# Patient Record
Sex: Female | Born: 1980 | Race: White | Hispanic: No | Marital: Married | Smoking: Current every day smoker
Health system: Southern US, Community
[De-identification: ages and names within clinical notes are randomized; demographics above are authoritative.]

## PROBLEM LIST (undated history)

## (undated) ENCOUNTER — Inpatient Hospital Stay: Payer: Self-pay

## (undated) DIAGNOSIS — O24419 Gestational diabetes mellitus in pregnancy, unspecified control: Secondary | ICD-10-CM

## (undated) DIAGNOSIS — F200 Paranoid schizophrenia: Secondary | ICD-10-CM

## (undated) DIAGNOSIS — E119 Type 2 diabetes mellitus without complications: Secondary | ICD-10-CM

## (undated) DIAGNOSIS — F319 Bipolar disorder, unspecified: Secondary | ICD-10-CM

## (undated) DIAGNOSIS — F329 Major depressive disorder, single episode, unspecified: Secondary | ICD-10-CM

## (undated) DIAGNOSIS — G4700 Insomnia, unspecified: Secondary | ICD-10-CM

## (undated) DIAGNOSIS — F419 Anxiety disorder, unspecified: Secondary | ICD-10-CM

## (undated) DIAGNOSIS — F32A Depression, unspecified: Secondary | ICD-10-CM

## (undated) HISTORY — PX: HERNIA REPAIR: SHX51

## (undated) HISTORY — PX: APPENDECTOMY: SHX54

---

## 2007-08-10 ENCOUNTER — Emergency Department: Payer: Self-pay | Admitting: Emergency Medicine

## 2007-08-10 ENCOUNTER — Other Ambulatory Visit: Payer: Self-pay

## 2010-08-13 ENCOUNTER — Ambulatory Visit: Payer: Self-pay | Admitting: Unknown Physician Specialty

## 2010-09-13 ENCOUNTER — Ambulatory Visit: Payer: Self-pay | Admitting: Unknown Physician Specialty

## 2010-09-16 ENCOUNTER — Emergency Department: Payer: Self-pay | Admitting: Internal Medicine

## 2012-03-16 ENCOUNTER — Inpatient Hospital Stay: Payer: Self-pay | Admitting: Surgery

## 2012-03-16 LAB — COMPREHENSIVE METABOLIC PANEL
Albumin: 4.3 g/dL (ref 3.4–5.0)
Alkaline Phosphatase: 70 U/L (ref 50–136)
Anion Gap: 8 (ref 7–16)
Calcium, Total: 9.1 mg/dL (ref 8.5–10.1)
Chloride: 106 mmol/L (ref 98–107)
Co2: 24 mmol/L (ref 21–32)
Creatinine: 0.81 mg/dL (ref 0.60–1.30)
EGFR (African American): >60
EGFR (Non-African Amer.): >60
Osmolality: 275 (ref 275–301)
Potassium: 3.9 mmol/L (ref 3.5–5.1)
SGOT(AST): 14 U/L — ABNORMAL LOW (ref 15–37)
SGPT (ALT): 16 U/L (ref 12–78)
Sodium: 138 mmol/L (ref 136–145)

## 2012-03-16 LAB — CBC
HCT: 41.9 % (ref 35.0–47.0)
HGB: 14.7 g/dL (ref 12.0–16.0)
MCH: 34.6 pg — ABNORMAL HIGH (ref 26.0–34.0)
MCV: 99 fL (ref 80–100)
Platelet: 305 10*3/uL (ref 150–440)
RBC: 4.24 10*6/uL (ref 3.80–5.20)
RDW: 12.4 % (ref 11.5–14.5)

## 2012-03-16 LAB — URINALYSIS, COMPLETE
Bilirubin,UR: NEGATIVE
Leukocyte Esterase: NEGATIVE
Nitrite: NEGATIVE
Ph: 6 (ref 4.5–8.0)
Protein: 100
RBC,UR: 4 /HPF (ref 0–5)

## 2012-03-16 LAB — LIPASE, BLOOD: Lipase: 121 U/L (ref 73–393)

## 2012-03-17 LAB — BASIC METABOLIC PANEL
Anion Gap: 7 (ref 7–16)
Calcium, Total: 7.9 mg/dL — ABNORMAL LOW (ref 8.5–10.1)
Chloride: 107 mmol/L (ref 98–107)
Co2: 27 mmol/L (ref 21–32)
Creatinine: 0.66 mg/dL (ref 0.60–1.30)
EGFR (African American): >60
Glucose: 90 mg/dL (ref 65–99)
Potassium: 3.5 mmol/L (ref 3.5–5.1)
Sodium: 141 mmol/L (ref 136–145)

## 2012-03-17 LAB — CBC WITH DIFFERENTIAL/PLATELET
Eosinophil #: 0 10*3/uL (ref 0.0–0.7)
Eosinophil %: 0.3 %
HCT: 34.1 % — ABNORMAL LOW (ref 35.0–47.0)
Lymphocyte #: 1.9 10*3/uL (ref 1.0–3.6)
MCH: 34.5 pg — ABNORMAL HIGH (ref 26.0–34.0)
MCHC: 34.4 g/dL (ref 32.0–36.0)
MCV: 100 fL (ref 80–100)
Monocyte #: 0.4 x10 3/mm (ref 0.2–0.9)
Neutrophil %: 85 %
Platelet: 234 10*3/uL (ref 150–440)
RBC: 3.4 10*6/uL — ABNORMAL LOW (ref 3.80–5.20)
RDW: 12.2 % (ref 11.5–14.5)

## 2012-03-19 LAB — CBC WITH DIFFERENTIAL/PLATELET
Basophil %: 0.6 %
Eosinophil %: 1.7 %
Lymphocyte %: 12.3 %
Monocyte %: 5.3 %
Neutrophil %: 80.1 %
Platelet: 257 10*3/uL (ref 150–440)
RBC: 3.32 10*6/uL — ABNORMAL LOW (ref 3.80–5.20)
RDW: 12.1 % (ref 11.5–14.5)
WBC: 10.5 10*3/uL (ref 3.6–11.0)

## 2012-03-19 LAB — PATHOLOGY REPORT

## 2012-07-13 IMAGING — CT CT CERVICAL SPINE WITHOUT CONTRAST
1 series · 12 of 14 positions shown, 15 images · non-contrast
Comparison: none

REASON FOR EXAM: neck pain
COMMENTS:

[Series 6: axial · axial · 0.28mm/px · z∈[+282,+402]mm · 12 of 72 slices shown, 15 images]
[im 6/72  soft-tissue]
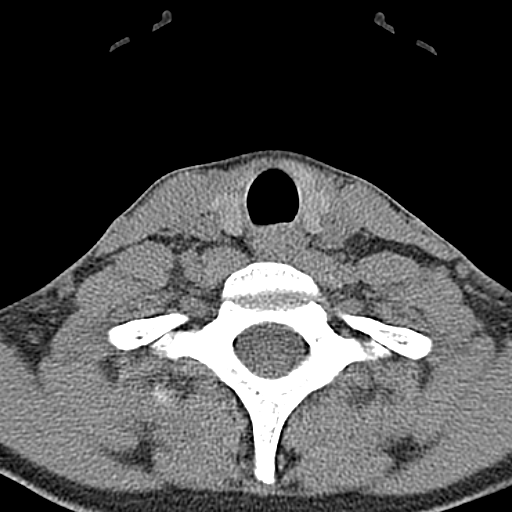
[im 6/72  bone]
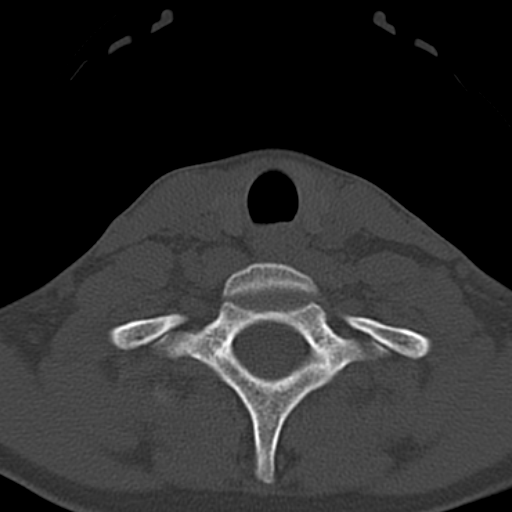
[im 11/72  bone]
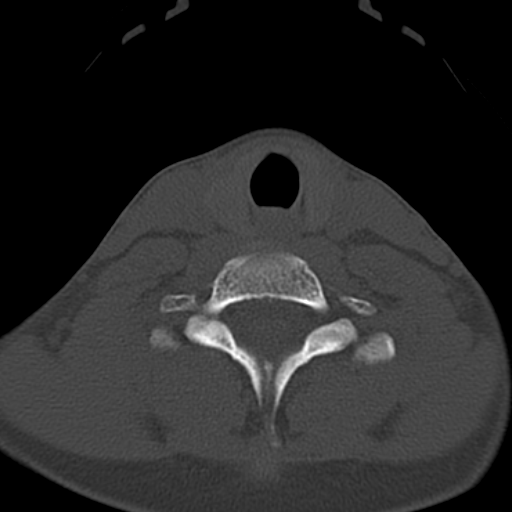
[im 17/72  bone]
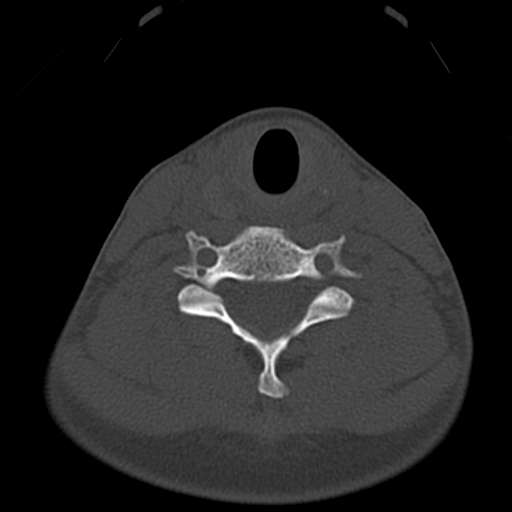
[im 22/72  bone]
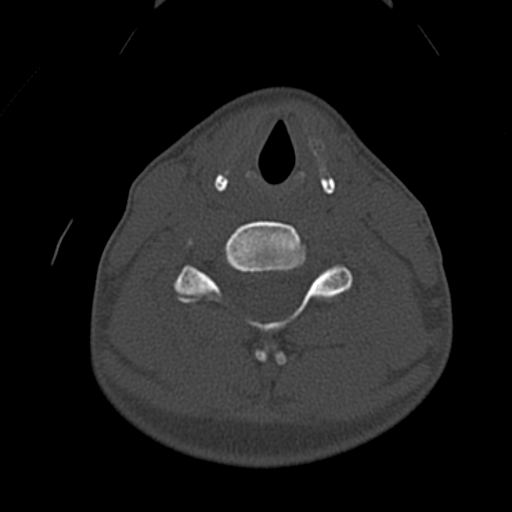
[im 28/72  soft-tissue]
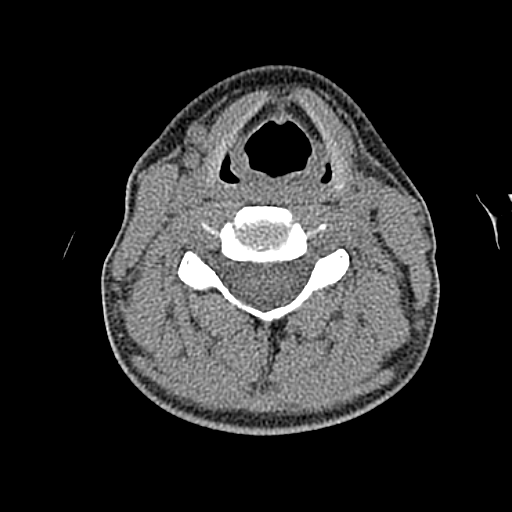
[im 28/72  bone]
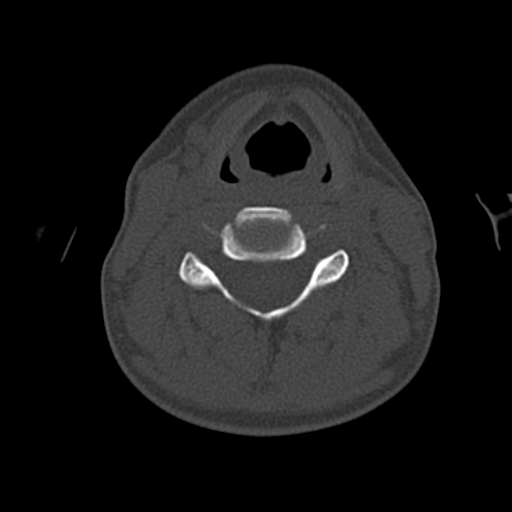
[im 33/72  bone]
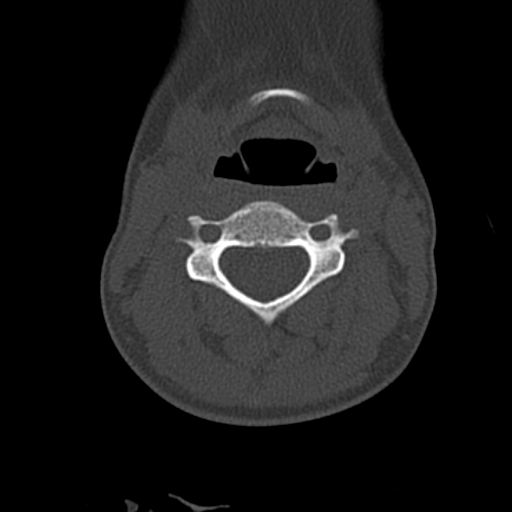
[im 39/72  bone]
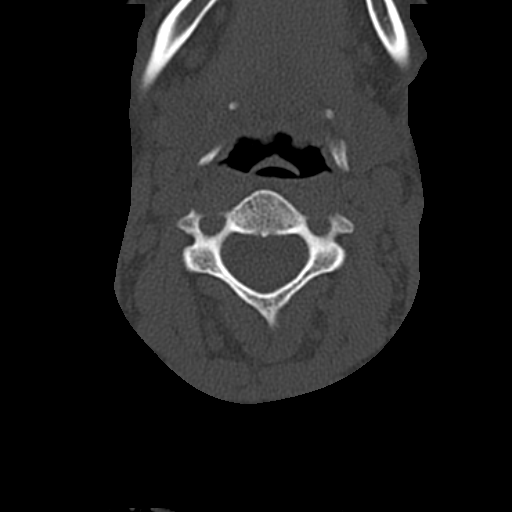
[im 44/72  bone]
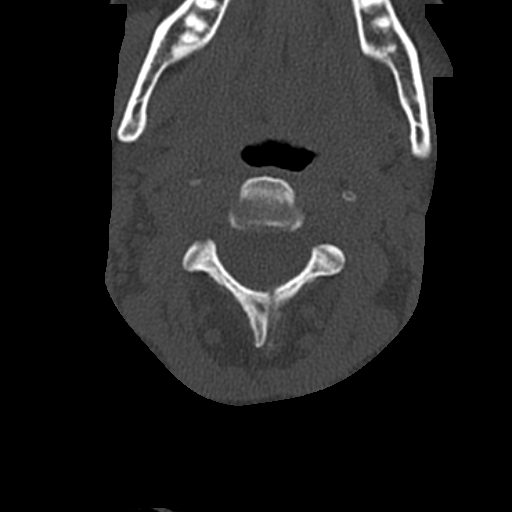
[im 50/72  soft-tissue]
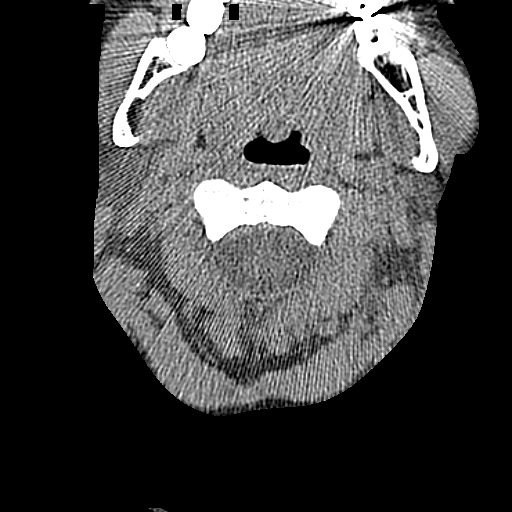
[im 50/72  bone]
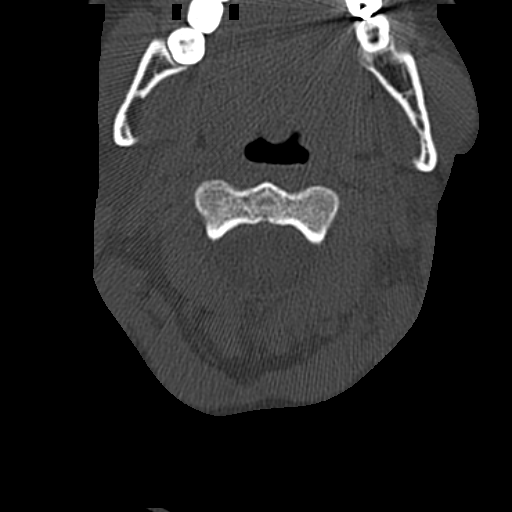
[im 55/72  bone]
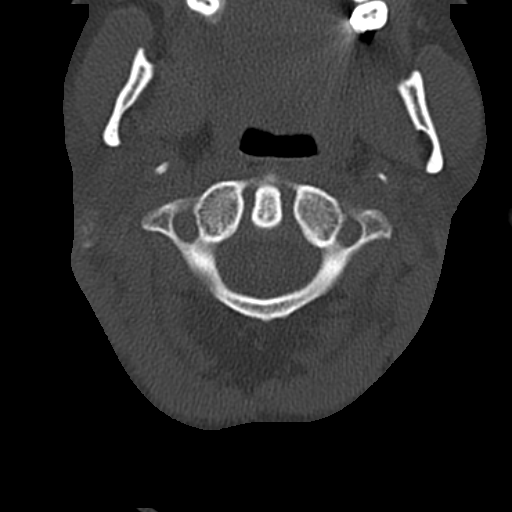
[im 61/72  bone]
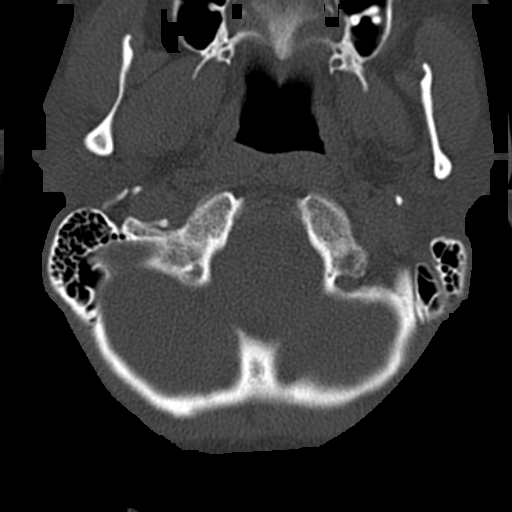
[im 66/72  bone]
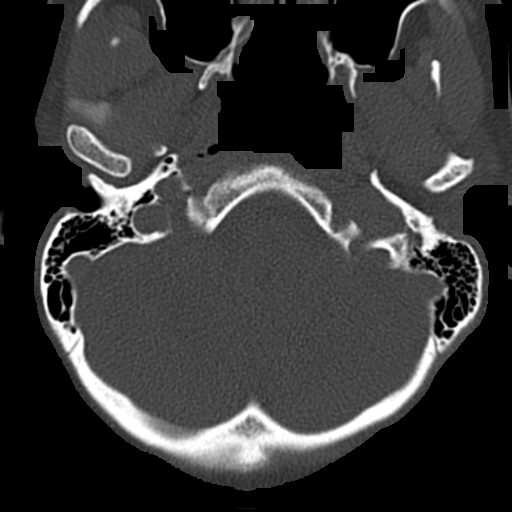

[12 of 14 positions shown; findings below may reference images not displayed]

PROCEDURE:     CT  - CT CERVICAL SPINE WO  - September 16, 2010 [DATE]

RESULT:     Sagittal, axial, and coronal images through the cervical spine
are reviewed. The cervical vertebral bodies are preserved in height. The
intervertebral disc space heights are well-maintained. The prevertebral soft
tissues appear normal. There is no evidence of a jumped facet. The spinous
processes are intact. The lateral masses of C1 align normally with those of
C2. The odontoid is intact. The bony ring at each cervical level is intact.
IMPRESSION: I see no evidence of an acute cervical spine fracture nor
dislocation.

## 2012-07-20 ENCOUNTER — Other Ambulatory Visit: Payer: Self-pay | Admitting: Internal Medicine

## 2012-07-20 LAB — RAPID INFLUENZA A&B ANTIGENS

## 2012-10-21 LAB — URINALYSIS, COMPLETE
Bilirubin,UR: NEGATIVE
Blood: NEGATIVE
Leukocyte Esterase: NEGATIVE
Ph: 7 (ref 4.5–8.0)
Protein: NEGATIVE
Squamous Epithelial: <1

## 2012-10-21 LAB — CBC
HGB: 14.7 g/dL (ref 12.0–16.0)
MCH: 33.5 pg (ref 26.0–34.0)
Platelet: 340 10*3/uL (ref 150–440)
RBC: 4.39 10*6/uL (ref 3.80–5.20)
RDW: 12.6 % (ref 11.5–14.5)
WBC: 11.2 10*3/uL — ABNORMAL HIGH (ref 3.6–11.0)

## 2012-10-21 LAB — COMPREHENSIVE METABOLIC PANEL WITH GFR
Albumin: 4.4 g/dL
Alkaline Phosphatase: 64 U/L
Anion Gap: 10
BUN: 5 mg/dL — ABNORMAL LOW
Bilirubin,Total: 0.4 mg/dL
Calcium, Total: 9 mg/dL
Chloride: 105 mmol/L
Co2: 23 mmol/L
Creatinine: 0.57 mg/dL — ABNORMAL LOW
EGFR (African American): >60
EGFR (Non-African Amer.): >60
Glucose: 90 mg/dL
Osmolality: 272
Potassium: 3.7 mmol/L
SGOT(AST): 23 U/L
SGPT (ALT): 25 U/L
Sodium: 138 mmol/L
Total Protein: 7.8 g/dL

## 2012-10-21 LAB — DRUG SCREEN, URINE
Amphetamines, Ur Screen: NEGATIVE (ref ?–1000)
Barbiturates, Ur Screen: NEGATIVE (ref ?–200)
Benzodiazepine, Ur Scrn: NEGATIVE (ref ?–200)
Cannabinoid 50 Ng, Ur ~~LOC~~: NEGATIVE (ref ?–50)
Cocaine Metabolite,Ur ~~LOC~~: NEGATIVE (ref ?–300)
Methadone, Ur Screen: NEGATIVE (ref ?–300)
Opiate, Ur Screen: NEGATIVE (ref ?–300)
Tricyclic, Ur Screen: NEGATIVE (ref ?–1000)

## 2012-10-21 LAB — ACETAMINOPHEN LEVEL: Acetaminophen: <2 ug/mL

## 2012-10-22 ENCOUNTER — Inpatient Hospital Stay: Payer: Self-pay | Admitting: Psychiatry

## 2012-10-26 LAB — PREGNANCY, URINE: Pregnancy Test, Urine: NEGATIVE m[IU]/mL

## 2013-12-14 ENCOUNTER — Emergency Department: Payer: Self-pay | Admitting: Emergency Medicine

## 2013-12-15 LAB — CBC
HCT: 40.9 % (ref 35.0–47.0)
HGB: 13.6 g/dL (ref 12.0–16.0)
MCH: 33.4 pg (ref 26.0–34.0)
MCHC: 33.3 g/dL (ref 32.0–36.0)
MCV: 100 fL (ref 80–100)
PLATELETS: 326 10*3/uL (ref 150–440)
RBC: 4.07 10*6/uL (ref 3.80–5.20)
RDW: 12.5 % (ref 11.5–14.5)
WBC: 12.4 10*3/uL — AB (ref 3.6–11.0)

## 2013-12-15 LAB — COMPREHENSIVE METABOLIC PANEL
ANION GAP: 10 (ref 7–16)
Albumin: 3.9 g/dL (ref 3.4–5.0)
Alkaline Phosphatase: 63 U/L
BUN: 4 mg/dL — ABNORMAL LOW (ref 7–18)
Bilirubin,Total: 0.2 mg/dL (ref 0.2–1.0)
CO2: 23 mmol/L (ref 21–32)
Calcium, Total: 8.5 mg/dL (ref 8.5–10.1)
Chloride: 108 mmol/L — ABNORMAL HIGH (ref 98–107)
Creatinine: 0.75 mg/dL (ref 0.60–1.30)
EGFR (African American): >60
Glucose: 113 mg/dL — ABNORMAL HIGH (ref 65–99)
OSMOLALITY: 279 (ref 275–301)
POTASSIUM: 3.4 mmol/L — AB (ref 3.5–5.1)
SGOT(AST): 19 U/L (ref 15–37)
SGPT (ALT): 22 U/L (ref 12–78)
Sodium: 141 mmol/L (ref 136–145)
TOTAL PROTEIN: 7.3 g/dL (ref 6.4–8.2)

## 2013-12-15 LAB — DRUG SCREEN, URINE

## 2013-12-15 LAB — URINALYSIS, COMPLETE
BACTERIA: NONE SEEN
BILIRUBIN, UR: NEGATIVE
BLOOD: NEGATIVE
Glucose,UR: NEGATIVE mg/dL (ref 0–75)
Ketone: NEGATIVE
LEUKOCYTE ESTERASE: NEGATIVE
Nitrite: NEGATIVE
PH: 6 (ref 4.5–8.0)
PROTEIN: NEGATIVE
RBC,UR: NONE SEEN /HPF (ref 0–5)
Specific Gravity: 1.001 (ref 1.003–1.030)
WBC UR: NONE SEEN /HPF (ref 0–5)

## 2013-12-15 LAB — TSH: THYROID STIMULATING HORM: 5.45 u[IU]/mL — AB

## 2013-12-15 LAB — ETHANOL: Ethanol: <3 mg/dL

## 2013-12-15 LAB — PREGNANCY, URINE: Pregnancy Test, Urine: NEGATIVE m[IU]/mL

## 2013-12-15 LAB — D-DIMER(ARMC): D-Dimer: 230 ng/ml

## 2013-12-15 LAB — SALICYLATE LEVEL: Salicylates, Serum: 4.6 mg/dL — ABNORMAL HIGH

## 2013-12-15 LAB — ACETAMINOPHEN LEVEL: Acetaminophen: <2 ug/mL

## 2014-01-11 IMAGING — CT CT ABD-PELV W/ CM
1 series · 15 of 32 positions shown, 19 images · IV contrast (isovue)
Comparison: None

REASON FOR EXAM: (1) pain  -  RLQ - WBC 25k,; (2) pain  -  RLQ - WBC 25k,
COMMENTS:

PROCEDURE:     CT  - CT ABDOMEN / PELVIS  W  - March 16, 2012  [DATE]
RESULT:     History: Right lower quadrant pain
TECHNIQUE: Multiple axial images of the abdomen and pelvis were performed
from the lung bases to the pubic symphysis, with p.o. contrast and with 80
ml of Isovue 300 intravenous contrast.

[Series 2: 3mm soft tissue · axial · 0.64mm/px · z∈[-348,-82]mm · 15 of 100 slices shown, 19 images]
[im 7/100  soft-tissue]
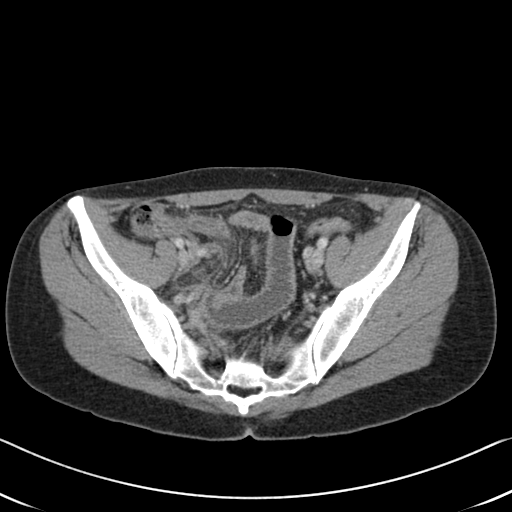
[im 7/100  bone]
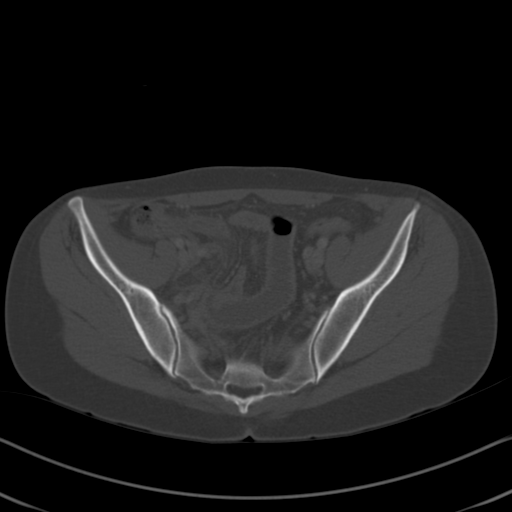
[im 13/100  soft-tissue]
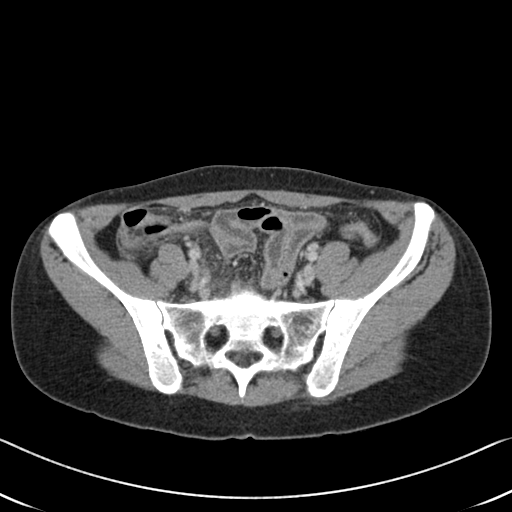
[im 20/100  soft-tissue]
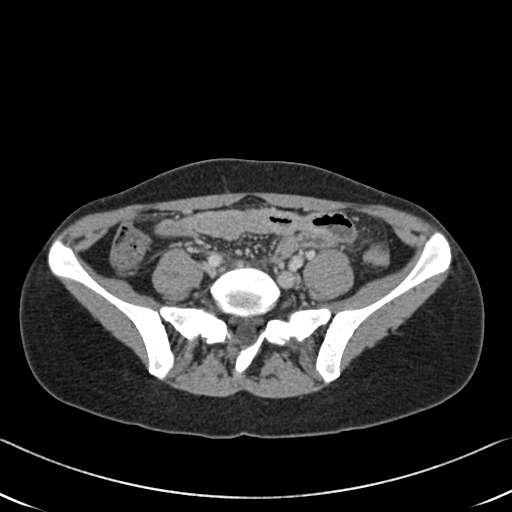
[im 29/100  soft-tissue]
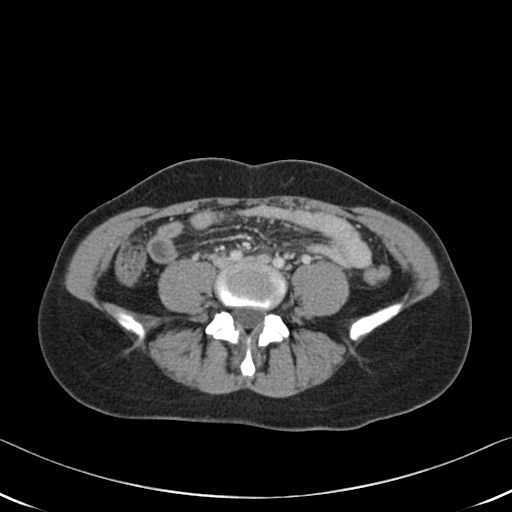
[im 36/100  soft-tissue]
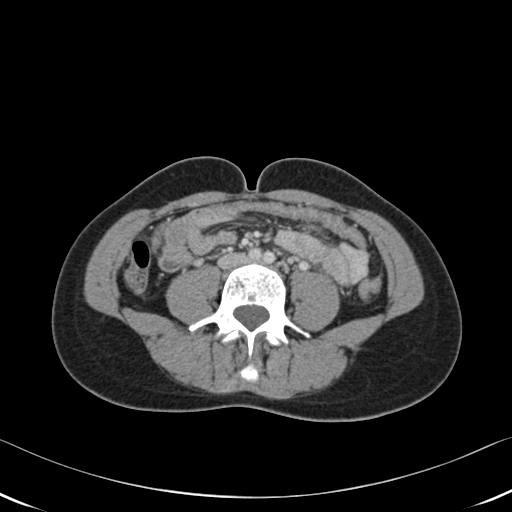
[im 42/100  soft-tissue]
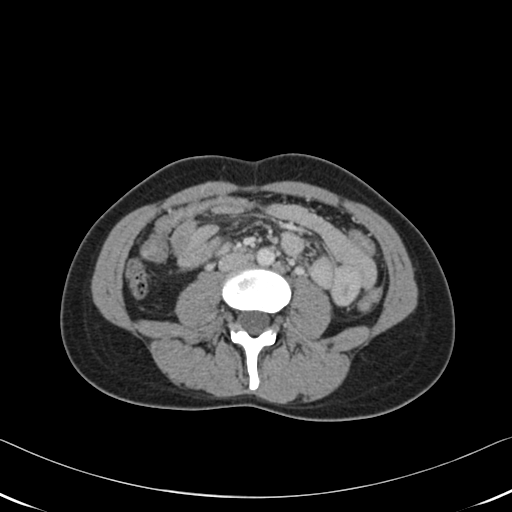
[im 52/100  soft-tissue]
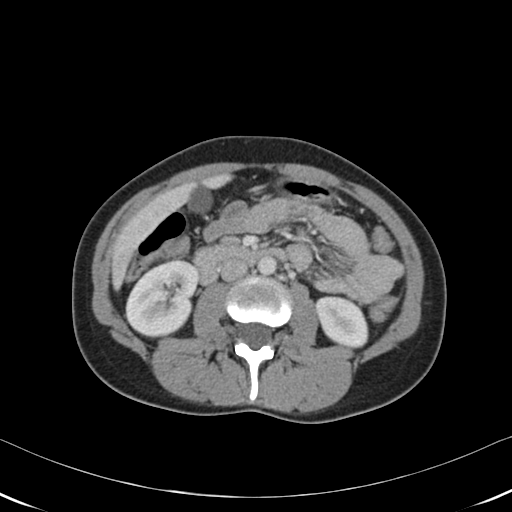
[im 58/100  soft-tissue]
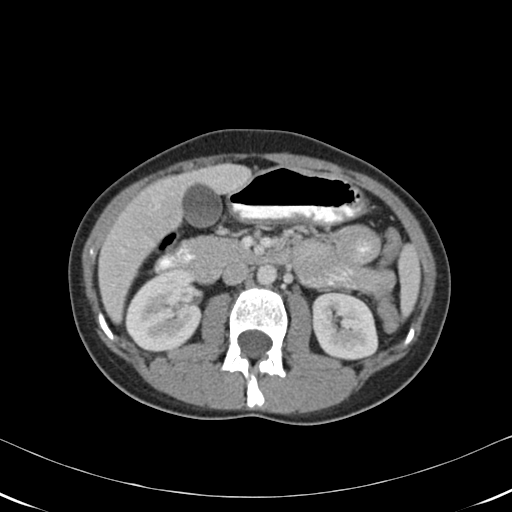
[im 64/100  soft-tissue]
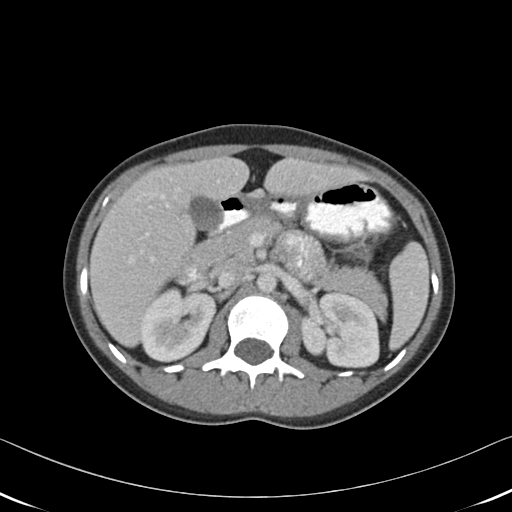
[im 64/100  bone]
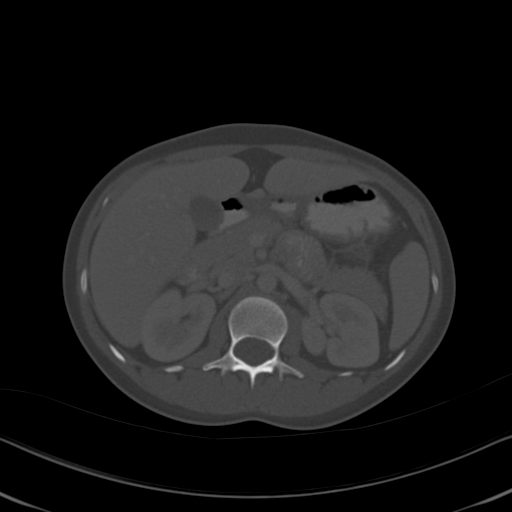
[im 71/100  soft-tissue]
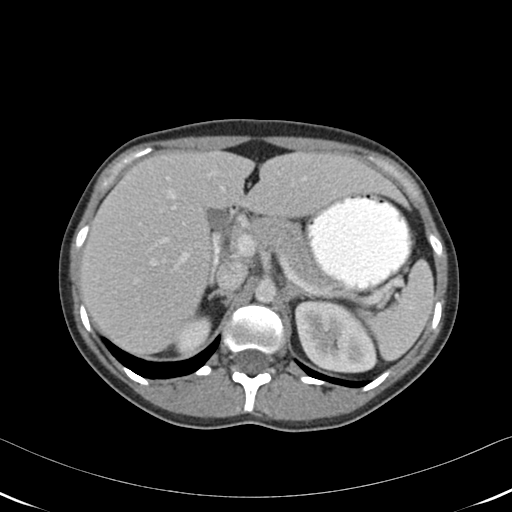
[im 80/100  soft-tissue]
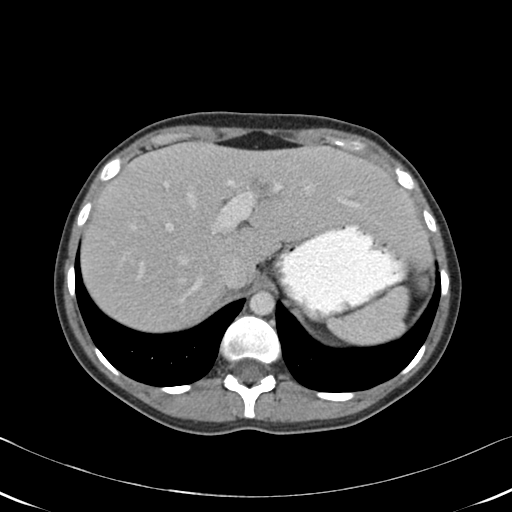
[im 87/100  soft-tissue]
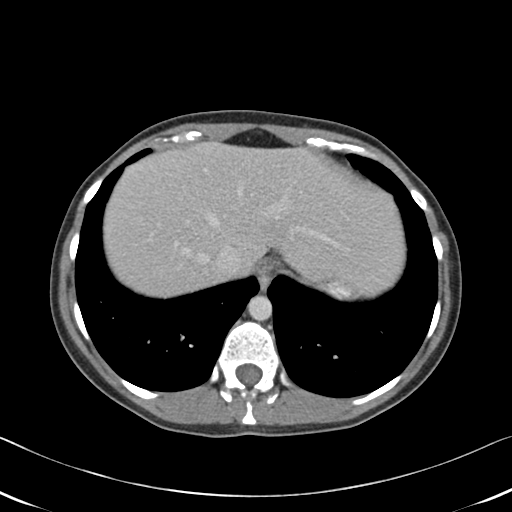
[im 87/100  lung]
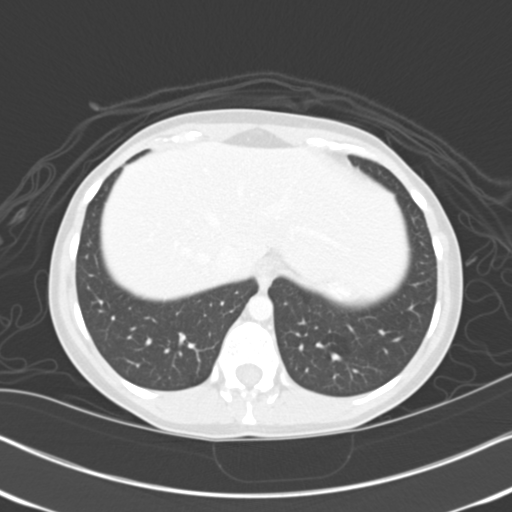
[im 90/100  lung]
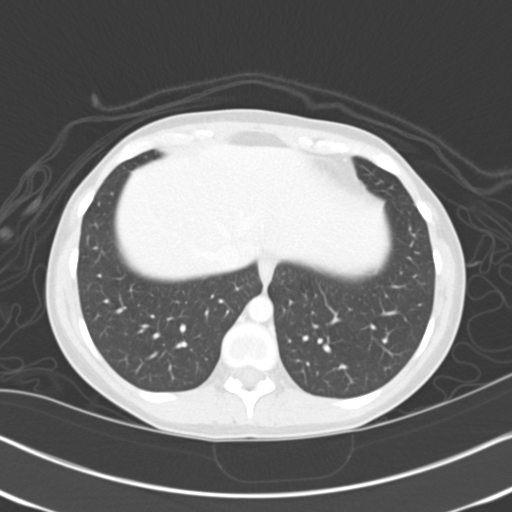
[im 93/100  soft-tissue]
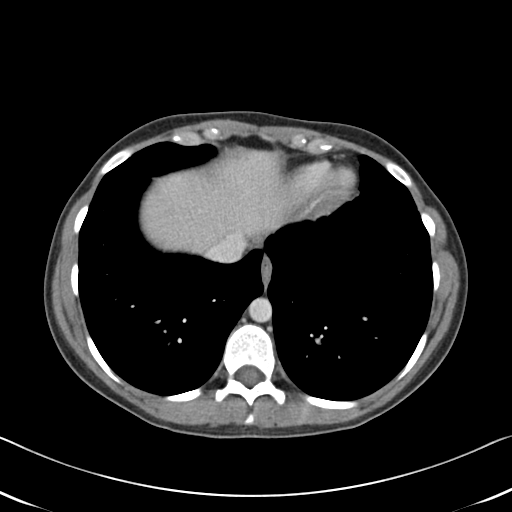
[im 93/100  lung]
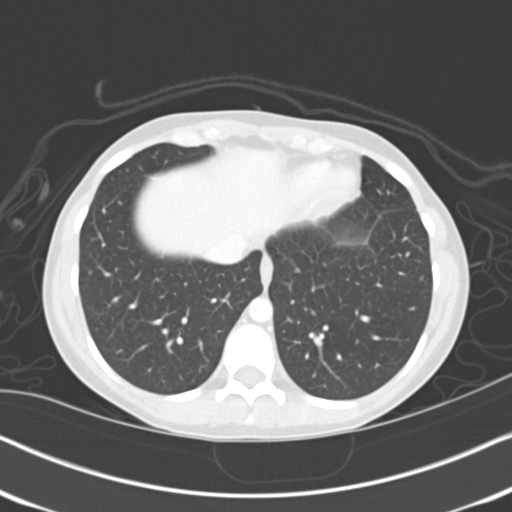
[im 96/100  lung]
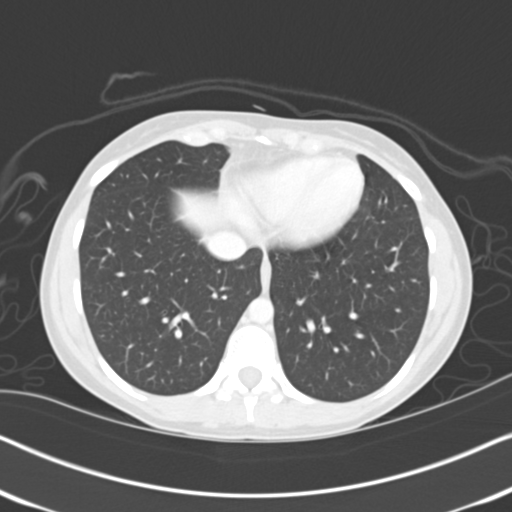

[15 of 32 positions shown; findings below may reference images not displayed]

FINDINGS: The lung bases are clear. There is no pneumothorax. The heart size is
normal.

The liver demonstrates no focal abnormality. There is no intrahepatic or
extrahepatic biliary ductal dilatation. The gallbladder is unremarkable. The
spleen demonstrates no focal abnormality. The kidneys, adrenal glands, and
pancreas are normal. The bladder is unremarkable.

The stomach, duodenum, small intestine, and large intestine demonstrate no
contrast extravasation or dilatation. The appendix is dilated with
surrounding periappendiceal inflammatory changes. There is a moderate amount
of pelvic free fluid. There is no pneumoperitoneum, pneumatosis, or portal
venous gas. There is no abdominal or pelvic free fluid. There is no
lymphadenopathy.

The abdominal aorta is normal in caliber .

The osseous structures are unremarkable.
IMPRESSION: 1. Dilated appendix with periappendiceal inflammatory changes and a moderate
amount of pelvic free fluid most concerning for acute appendicitis.
Appendiceal perforation cannot be excluded given the amount of right lower
quadrant free fluid. There is no drainable fluid collection.

These findings were communicated to Dr. Breuer on 03/16/2012 at 4360 hours.

[REDACTED]

## 2014-04-29 ENCOUNTER — Emergency Department: Payer: Self-pay | Admitting: Emergency Medicine

## 2014-04-29 LAB — CBC WITH DIFFERENTIAL/PLATELET
BASOS ABS: 0.1 10*3/uL (ref 0.0–0.1)
Basophil %: 0.9 %
Eosinophil #: 0.1 10*3/uL (ref 0.0–0.7)
Eosinophil %: 1.4 %
HCT: 44.3 % (ref 35.0–47.0)
HGB: 14.4 g/dL (ref 12.0–16.0)
Lymphocyte #: 2.2 10*3/uL (ref 1.0–3.6)
Lymphocyte %: 26.4 %
MCH: 33.2 pg (ref 26.0–34.0)
MCHC: 32.6 g/dL (ref 32.0–36.0)
MCV: 102 fL — ABNORMAL HIGH (ref 80–100)
MONO ABS: 0.6 x10 3/mm (ref 0.2–0.9)
Monocyte %: 6.5 %
Neutrophil #: 5.5 10*3/uL (ref 1.4–6.5)
Neutrophil %: 64.8 %
Platelet: 351 10*3/uL (ref 150–440)
RBC: 4.35 10*6/uL (ref 3.80–5.20)
RDW: 13.3 % (ref 11.5–14.5)
WBC: 8.5 10*3/uL (ref 3.6–11.0)

## 2014-04-29 LAB — URINALYSIS, COMPLETE
Bacteria: NONE SEEN
Bilirubin,UR: NEGATIVE
Blood: NEGATIVE
GLUCOSE, UR: NEGATIVE mg/dL (ref 0–75)
KETONE: NEGATIVE
LEUKOCYTE ESTERASE: NEGATIVE
Nitrite: NEGATIVE
PH: 7 (ref 4.5–8.0)
PROTEIN: NEGATIVE
RBC,UR: NONE SEEN /HPF (ref 0–5)
Specific Gravity: 1.004 (ref 1.003–1.030)
Squamous Epithelial: 1
WBC UR: NONE SEEN /HPF (ref 0–5)

## 2014-04-29 LAB — COMPREHENSIVE METABOLIC PANEL
ALK PHOS: 66 U/L
Albumin: 4 g/dL (ref 3.4–5.0)
Anion Gap: 6 — ABNORMAL LOW (ref 7–16)
BUN: 5 mg/dL — AB (ref 7–18)
Bilirubin,Total: 0.6 mg/dL (ref 0.2–1.0)
CALCIUM: 8.7 mg/dL (ref 8.5–10.1)
CHLORIDE: 109 mmol/L — AB (ref 98–107)
CREATININE: 0.92 mg/dL (ref 0.60–1.30)
Co2: 23 mmol/L (ref 21–32)
EGFR (Non-African Amer.): >60
GLUCOSE: 115 mg/dL — AB (ref 65–99)
Osmolality: 274 (ref 275–301)
Potassium: 4.1 mmol/L (ref 3.5–5.1)
SGOT(AST): 22 U/L (ref 15–37)
SGPT (ALT): 22 U/L
Sodium: 138 mmol/L (ref 136–145)
Total Protein: 7.1 g/dL (ref 6.4–8.2)

## 2014-04-29 LAB — D-DIMER(ARMC): D-DIMER: 143 ng/mL

## 2014-04-29 LAB — LIPASE, BLOOD: Lipase: 142 U/L (ref 73–393)

## 2014-11-21 NOTE — Discharge Summary (Signed)
PATIENT NAME:  Frances Sanders, Frances Sanders MR#:  [id number] DATE OF BIRTH:  1980-09-12  DATE OF ADMISSION:  03/16/2012 DATE OF DISCHARGE:  03/20/2012  DISCHARGE DIAGNOSES: Perforated gangrenous appendicitis.  PRINCIPAL PROCEDURE: Laparoscopic appendectomy.   RADIOLOGICAL DATA:  CT scan of the abdomen.   HOSPITAL COURSE: The patient was admitted on 03/16/2012 and was taken to the Operating Room for laparoscopic appendectomy. The patient was then treated with intravenous antibiotics consisting of Flagyl and Rocephin postoperatively and tolerated this well. She had an ileus for several days. By postoperative day two she was still complaining of some mild abdominal pain but remained afebrile. On postoperative day three, the patient started having gas. White count was repeated and found to be 10.5. She was deemed stable for discharge on oral antibiotics the following day in good condition.   DISCHARGE PLAN:  1. Treatment with 10 days of oral Augmentin and Flagyl.  2. Follow up with me in one week's time for followup.  3. Call with any questions or concerns.   ____________________________ Redge GainerMark A. Egbert GaribaldiBird, MD mab:cbb D: 03/26/2012 12:29:07 ET T: 03/26/2012 12:46:54 ET JOB#: [id number]  cc: Loraine LericheMark A. Egbert GaribaldiBird, MD, <Dictator> Raynald KempMARK A Waneda Klammer MD ELECTRONICALLY SIGNED 03/29/2012 9:55

## 2014-11-21 NOTE — H&P (Signed)
Subjective/Chief Complaint 1 1/2 day of rlq abdominal pain, nausea, emesis, fevers.  LMP April 2013.  Recent treatment for tick bite.  Workup in Dr. Jennette Kettle office c/w acute appendicitis.  CT scan concerning for perforated appendix.    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    Primary Physician Dr. Claudius Sis.   Past Med/Surgical Hx:  Migraines:   HTN:   ALLERGIES:  No Known Allergies:     Other Allergies none   HOME MEDICATIONS: Medication Instructions Status  alprazolam 0.25 mg oral tablet 1 tab(s) orally once a day, As Needed- for Anxiety, Nervousness  Active  Seasonale 0.15 mg-30 mcg oral tablet 1 tab(s) orally once a day Active  Ambien 5 mg oral tablet 1 tab(s) orally once a day (at bedtime), As Needed- for Headache , for Inability to Sleep  Active  B Complex 50 1 tab(s) orally 2 times a week Active  Vitamin D2 50,000 intl units (1.25 mg) oral capsule 1 cap(s) orally 2 times a week Active  folic acid 1 mg oral tablet 1 tab(s) orally once a day (in the evening) Active   Family and Social History:   Family History Non-Contributory    Social History positive  tobacco    + Tobacco Current (within 1 year)    Place of Living Home   Review of Systems:   Subjective/Chief Complaint see above    Abdominal Pain Yes    Nausea/Vomiting Yes    SOB/DOE No    Chest Pain No    Dysuria No    Tolerating Diet No  Nauseated  Vomiting    Medications/Allergies Reviewed Medications/Allergies reviewed   Physical Exam:   GEN no acute distress, thin, disheveled, temp 97.9 p 77    HEENT pale conjunctivae    NECK supple    RESP normal resp effort  clear BS    CARD regular rate    ABD positive tenderness  soft  rlq peritoneal signs    LYMPH negative neck    EXTR negative cyanosis/clubbing    SKIN normal to palpation    NEURO cranial nerves intact    PSYCH A+O to time, place, person   Lab Results:  Hepatic:  13-Aug-13  10:58    Bilirubin, Total 0.8   Alkaline Phosphatase 70   SGPT (ALT) 16   SGOT (AST)  14   Total Protein, Serum 8.1   Albumin, Serum 4.3  Routine Chem:  13-Aug-13 10:58    Glucose, Serum  117   BUN 7   Creatinine (comp) 0.81   Sodium, Serum 138   Potassium, Serum 3.9   Chloride, Serum 106   CO2, Serum 24   Calcium (Total), Serum 9.1   Osmolality (calc) 275   eGFR (African American) >60   eGFR (Non-African American) >60 (eGFR values <30m/min/1.73 m2 may be an indication of chronic kidney disease (CKD). Calculated eGFR is useful in patients with stable renal function. The eGFR calculation will not be reliable in acutely ill patients when serum creatinine is changing rapidly. It is not useful in  patients on dialysis. The eGFR calculation may not be applicable to patients at the low and high extremes of body sizes, pregnant women, and vegetarians.)   Anion Gap 8   Lipase 121 (Result(s) reported on 16 Mar 2012 at 11:28AM.)  Routine UA:  13-Aug-13 10:58    Color (UA) Amber   Clarity (UA) Hazy   Glucose (UA) Negative  Bilirubin (UA) Negative   Ketones (UA) Trace   Specific Gravity (UA) 1.034   Blood (UA) Negative   pH (UA) 6.0   Protein (UA) 100 mg/dL   Nitrite (UA) Negative   Leukocyte Esterase (UA) Negative (Result(s) reported on 16 Mar 2012 at 11:49AM.)   RBC (UA) 4 /HPF   WBC (UA) 1 /HPF   Bacteria (UA) NONE SEEN   Epithelial Cells (UA) 1 /HPF   Mucous (UA) PRESENT (Result(s) reported on 16 Mar 2012 at 11:49AM.)  Routine Hem:  13-Aug-13 10:58    WBC (CBC)  25.0   RBC (CBC) 4.24   Hemoglobin (CBC) 14.7   Hematocrit (CBC) 41.9   Platelet Count (CBC) 305 (Result(s) reported on 16 Mar 2012 at 11:14AM.)   MCV 99   MCH  34.6   MCHC 35.0   RDW 12.4   Radiology Results: CT:    13-Aug-13 14:01, CT Abdomen and Pelvis With Contrast   CT Abdomen and Pelvis With Contrast   REASON FOR EXAM:    (1) pain  -  RLQ - WBC 25k,; (2) pain  -  RLQ - WBC   25k,  COMMENTS:        PROCEDURE: CT  - CT ABDOMEN / PELVIS  W  - Mar 16 2012  2:01PM     RESULT: History: Right lower quadrant pain    Comparison:  None    Technique: Multiple axial images of the abdomen and pelvis were performed   from the lung bases to the pubic symphysis, with p.o. contrast and with   80 ml of Isovue 300 intravenous contrast.    Findings:  The lung bases are clear. There is no pneumothorax. The heart size is   normal.     The liver demonstrates no focal abnormality. There is no intrahepatic or   extrahepatic biliary ductal dilatation. The gallbladder is unremarkable.   The spleen demonstrates no focal abnormality. The kidneys, adrenal   glands, and pancreas are normal. The bladder is unremarkable.     The stomach, duodenum, small intestine, and large intestine demonstrate   no contrast extravasation or dilatation. The appendix is dilated with   surrounding periappendiceal inflammatory changes. There is a moderate   amount of pelvic free fluid. There is no pneumoperitoneum, pneumatosis,   or portal venous gas. There is no abdominal or pelvic free fluid. There   is no lymphadenopathy.     The abdominal aorta is normal in caliber .    The osseous structures are unremarkable.    IMPRESSION:     1. Dilated appendix with periappendiceal inflammatory changes and a   moderate amount of pelvic free fluid most concerning for acute   appendicitis. Appendiceal perforation cannot be excluded given theamount   of right lower quadrant free fluid. There is no drainable fluid   collection.    These findings were communicated to Dr. Thomasene Lot on 03/16/2012 at 1407   hours.    Dictation Site: 1          Verified By: Jennette Banker, M.D., MD     Assessment/Admission Diagnosis 44 yowf with acute and possibly perforated appendicitis    Plan npo, IV abx, lap appy discussed with patient and family and all questions addressed.   Electronic Signatures: Sherri Rad (MD)  (Signed 13-Aug-13  16:01)  Authored: CHIEF COMPLAINT and HISTORY, PAST MEDICAL/SURGIAL HISTORY, ALLERGIES, Other Allergies, HOME MEDICATIONS, FAMILY AND SOCIAL HISTORY, REVIEW OF SYSTEMS, PHYSICAL EXAM, LABS, Radiology, ASSESSMENT AND PLAN  Last Updated: 13-Aug-13 16:01 by Sherri Rad (MD)

## 2014-11-21 NOTE — Op Note (Signed)
PATIENT NAME:  Frances Sanders, Frances Sanders MR#:  [id number] DATE OF BIRTH:  22-Jul-1981  DATE OF PROCEDURE:  03/17/2012  PREOPERATIVE DIAGNOSIS: Acute appendicitis.   POSTOPERATIVE DIAGNOSIS: Acute perforated appendicitis with gangrene.   PROCEDURE PERFORMED: Laparoscopic appendectomy.   SURGEON: Yarieliz Wasser A. Egbert GaribaldiBird, MD   ASSISTANT: None.   TYPE OF ANESTHESIA: General endotracheal.   FINDINGS: Acute gangrenous appendicitis with perforation and free pus.   SPECIMENS: Appendix to pathology.   ESTIMATED BLOOD LOSS: Minimal.   DRAINS: None.   LAP AND NEEDLE COUNT: Correct x2.   DESCRIPTION OF PROCEDURE: With the patient in the supine position, general endotracheal anesthesia was induced. Her abdomen was prepped and draped with ChloraPrep solution. Time-out was observed. A short infraumbilical transversely oriented skin incision was fashioned with scalpel and carried down with sharp dissection to the fascia which was incised in the midline and elevated with Kocher clamps. Stay sutures were passed on either side of the abdominal midline fascia. A 12 mm blunt Hassan trocar was then placed under direct visualization. Pneumoperitoneum was established. A 5 mm Bladeless trocar was placed in the right upper quadrant. A 5 mm Bladeless trocar was placed in the left lower quadrant. There was purulent fluid over the right lobe of the liver as well as within the pelvis around the uterus. This was immediately aspirated. During the operation a total of 3 liters of warm normal saline was used to irrigate the entire abdominal cavity. The cecum was identified and the base of the cecum was identified within the right lower quadrant. This was traced inferiorly and just underneath the right fallopian tube at which point there was evidence of what appeared to be a contained early perforation of a gangrenous appendix. The appendix was gently teased from within the pelvis liberating a large amount of thick purulent fluid which was  then immediately aspirated. The appendix was elevated towards the anterior abdominal wall.   A window was fashioned at the base of the appendix. A 35 mm endoscopic bowel load stapler with blue load was applied across the base of the appendix and specimen was then transected off the cecum. The mesoappendix was then taken with one fire of the same stapler with white load application. Hemostasis appeared to be excellent. The appendix was placed into an EndoCatch device and retrieved. At this point the abdomen was irrigated with 3 liters of warm normal saline in all quadrants. Examination of the right tube demonstrated it to be intact and the ovary unremarkable. With fluid aspirated, ports were removed under direct visualization, pneumoperitoneum was released and the infraumbilical fascial defect was closed with an additional figure-of-eight #0 Vicryl suture. A total of 30 mL of 0.25% plain Marcaine was infiltrated along all skin and fascial incisions prior to closure. 4-0 Vicryl subcuticular was applied to all skin edges followed by the application of benzoin, Steri-Strips, Telfa, and Tegaderm. The patient was then subsequently extubated and taken to the recovery room in stable and satisfactory condition by anesthesia services.   ____________________________ Redge GainerMark A. Egbert GaribaldiBird, MD mab:drc D: 03/17/2012 17:36:01 ET T: 03/18/2012 08:06:39 ET JOB#: [id number]  cc: Loraine LericheMark A. Egbert GaribaldiBird, MD, <Dictator> Raynald KempMARK A Zakyla Tonche MD ELECTRONICALLY SIGNED 03/24/2012 15:04

## 2014-11-24 NOTE — Discharge Summary (Signed)
PATIENT NAME:  Frances Sanders, Frances Sanders MR#:  [id number] DATE OF BIRTH:  October 04, 1980  DATE OF ADMISSION:  10/22/2012 DATE OF DISCHARGE:  11/01/2012  HOSPITAL COURSE: See dictated history and physical for details of admission. This 34 year old woman without a significant past psychiatric history was admitted with what sounded like about 2 weeks of gradually progressive psychotic symptoms. She had become paranoid and confused in her thinking and appeared to be having hallucinations. There was concern initially that it may have been related to something she had consumed, but further history from the family suggested that it had been going on longer than the patient had realized. In the hospital, the patient was treated with Risperdal for psychotic symptoms which she tolerated well. She had a small degree of extrapyramidal symptoms which were treated with Cogentin. She was also given Ativan p.o. Initially, she actually had a decline in functioning in the hospital and became catatonic for about a day, or close to it. She ceased talking and was interacting in a very strange and flat, withdrawn manner. After being given IM shots of medication, she seemed to pop out of it and then showed rapid improvement. By the time of discharge, the patient appears to be thinking lucidly and clearly. She is taking care of her activities of daily living appropriately. She denies any paranoia. She denies any auditory or visual hallucinations. No evidence of suicidal or homicidal ideation. The patient was educated about the unclear nature of the diagnosis, including the possibility that this might be a brief psychotic episode that does not recur, and then again it may turn out to be a more long-term thing. She was educated that we recommend her staying on medication at this time after discharge. She has a follow-up appointment scheduled with Dr. Maryruth Bun for outpatient psychiatric treatment.   DISCHARGE MEDICATIONS: Cogentin 0.5 mg twice a day  p.r.n. for stiffness and EPS, Jolessa 0.15 mg/0.03 mg once per day, Risperdal M-tabs 1 mg twice a day, Ativan 1 mg twice a day.   LABORATORY AND RADIOLOGICAL DATA:  Admission labs showed a drug screen with a positive screen for MDMA, unclear what that represented. Chemistry showed a creatinine low at 0.57, BUN low at 5, otherwise normal. CBC showed a slightly elevated white count at 11.2. Urinalysis did not appear to be infected. Acetaminophen and salicylates were not toxic. Pregnancy test negative.  EKG showed sinus tachycardia, otherwise unremarkable.    MENTAL STATUS EXAM AT DISCHARGE: Patient who appears her stated age, cooperative and pleasant in the interview. Good eye contact, normal psychomotor activity. Speech normal in rate, tone and volume. Affect still a little bit flattish but more reactive and more normal certainly that when she came in. Mood stated as being fine. Thoughts appear to be lucid and more clear with no sign of bizarre thinking, thought blocking or disorientation. Denies auditory or visual hallucinations. Denies suicidal or homicidal ideation. Shows improved judgment and insight. She is agreeable to staying on medication and following up with outpatient psychiatric treatment. No acute dangerousness identified. Intelligence is normal. Alert and oriented x 4.   DISPOSITION: Discharge home on current medicine.   FOLLOWUP:  With Dr. Maryruth Bun for outpatient psychiatric treatment.   DIAGNOSIS, PRINCIPAL AND PRIMARY:  AXIS I:  Psychosis, not otherwise specified.     SECONDARY DIAGNOSES: AXIS I:  1. Rule out schizophreniform disorder. 2. Rule out brief psychotic episode.   AXIS II: No diagnosis.   AXIS III: Mild extrapyramidal symptoms, resolved.   AXIS III: Moderate  chronic stress from multiple responsibilities.   AXIS V: Functioning at time of discharge 62.   ____________________________ Audery AmelJohn T. Mckinsley Koelzer, MD jtc:cb D: 11/01/2012 17:24:49 ET T: 11/01/2012 23:07:07  ET JOB#: [id number]  cc: Audery AmelJohn T. Terrace Chiem, MD, <Dictator> Audery AmelJOHN T Aavya Shafer MD ELECTRONICALLY SIGNED 11/02/2012 0:22
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
## 2015-08-05 NOTE — L&D Delivery Note (Signed)
Delivery Note Primary OB: [facility] Delivery Physician: Frances MajorPaul Kathline Banbury, MD Gestational Age: Full term Antepartum complications: gestational diabetes Intrapartum complications: None  A viable Female was delivered via vertex perentation. Vacuum was placed once due to recurrent variable decels and numbness from epidural making pushing effort inadequate, and had pop off after bringing vertex down significantly, baby then delivered on next push by patient. Apgars:8 ,9  Weight:  pending .   Placenta status: spontaneous and Intact.  Cord: 3+ vessels;  with the following complications: none.  Anesthesia:  epidural Episiotomy:  none Lacerations:  none Suture Repair: none Est. Blood Loss (mL):  200 mL  Mom to postpartum.  Baby to Couplet care / Skin to Skin.  Frances MajorPaul Kamron Portee, MD Dept of OB/GYN [phone]

## 2015-09-06 ENCOUNTER — Emergency Department
Admission: EM | Admit: 2015-09-06 | Discharge: 2015-09-07 | Disposition: A | Discharge status: Other Acute Inpatient Hospital | Payer: Self-pay | Attending: Emergency Medicine | Admitting: Emergency Medicine

## 2015-09-06 DIAGNOSIS — R42 Dizziness and giddiness: Secondary | ICD-10-CM | POA: Insufficient documentation

## 2015-09-06 DIAGNOSIS — R45851 Suicidal ideations: Secondary | ICD-10-CM | POA: Insufficient documentation

## 2015-09-06 DIAGNOSIS — R4689 Other symptoms and signs involving appearance and behavior: Secondary | ICD-10-CM

## 2015-09-06 DIAGNOSIS — F1310 Sedative, hypnotic or anxiolytic abuse, uncomplicated: Secondary | ICD-10-CM | POA: Insufficient documentation

## 2015-09-06 DIAGNOSIS — T424X1A Poisoning by benzodiazepines, accidental (unintentional), initial encounter: Secondary | ICD-10-CM

## 2015-09-06 DIAGNOSIS — R4589 Other symptoms and signs involving emotional state: Secondary | ICD-10-CM

## 2015-09-06 DIAGNOSIS — F3130 Bipolar disorder, current episode depressed, mild or moderate severity, unspecified: Secondary | ICD-10-CM | POA: Insufficient documentation

## 2015-09-06 DIAGNOSIS — F329 Major depressive disorder, single episode, unspecified: Secondary | ICD-10-CM

## 2015-09-06 DIAGNOSIS — F32A Depression, unspecified: Secondary | ICD-10-CM

## 2015-09-06 DIAGNOSIS — Z3202 Encounter for pregnancy test, result negative: Secondary | ICD-10-CM | POA: Insufficient documentation

## 2015-09-06 DIAGNOSIS — R258 Other abnormal involuntary movements: Secondary | ICD-10-CM | POA: Insufficient documentation

## 2015-09-06 DIAGNOSIS — F332 Major depressive disorder, recurrent severe without psychotic features: Secondary | ICD-10-CM

## 2015-09-06 DIAGNOSIS — F17200 Nicotine dependence, unspecified, uncomplicated: Secondary | ICD-10-CM | POA: Insufficient documentation

## 2015-09-06 DIAGNOSIS — T424X2A Poisoning by benzodiazepines, intentional self-harm, initial encounter: Secondary | ICD-10-CM | POA: Insufficient documentation

## 2015-09-06 DIAGNOSIS — Z79899 Other long term (current) drug therapy: Secondary | ICD-10-CM | POA: Insufficient documentation

## 2015-09-06 DIAGNOSIS — Z046 Encounter for general psychiatric examination, requested by authority: Secondary | ICD-10-CM

## 2015-09-06 HISTORY — DX: Depression, unspecified: F32.A

## 2015-09-06 HISTORY — DX: Major depressive disorder, single episode, unspecified: F32.9

## 2015-09-06 HISTORY — DX: Bipolar disorder, unspecified: F31.9

## 2015-09-06 LAB — COMPREHENSIVE METABOLIC PANEL
ALBUMIN: 4.8 g/dL (ref 3.5–5.0)
ALK PHOS: 93 U/L (ref 38–126)
ALT: 28 U/L (ref 14–54)
ANION GAP: 9 (ref 5–15)
AST: 24 U/L (ref 15–41)
BUN: 8 mg/dL (ref 6–20)
CALCIUM: 9 mg/dL (ref 8.9–10.3)
CO2: 23 mmol/L (ref 22–32)
Chloride: 106 mmol/L (ref 101–111)
Creatinine, Ser: 0.69 mg/dL (ref 0.44–1.00)
GFR calc Af Amer: >60 mL/min (ref >60)
GFR calc non Af Amer: >60 mL/min (ref >60)
GLUCOSE: 162 mg/dL — AB (ref 65–99)
Potassium: 3.6 mmol/L (ref 3.5–5.1)
SODIUM: 138 mmol/L (ref 135–145)
Total Bilirubin: 0.4 mg/dL (ref 0.3–1.2)
Total Protein: 7.1 g/dL (ref 6.5–8.1)

## 2015-09-06 LAB — URINALYSIS COMPLETE WITH MICROSCOPIC (ARMC ONLY)
BILIRUBIN URINE: NEGATIVE
GLUCOSE, UA: NEGATIVE mg/dL
HGB URINE DIPSTICK: NEGATIVE
KETONES UR: NEGATIVE mg/dL
LEUKOCYTES UA: NEGATIVE
NITRITE: NEGATIVE
PH: 6 (ref 5.0–8.0)
Protein, ur: NEGATIVE mg/dL
Specific Gravity, Urine: 1.01 (ref 1.005–1.030)
WBC, UA: NONE SEEN WBC/hpf (ref 0–5)

## 2015-09-06 LAB — CBC
HCT: 41.5 % (ref 35.0–47.0)
HEMOGLOBIN: 14.4 g/dL (ref 12.0–16.0)
MCH: 33.4 pg (ref 26.0–34.0)
MCHC: 34.8 g/dL (ref 32.0–36.0)
MCV: 96.2 fL (ref 80.0–100.0)
PLATELETS: 334 10*3/uL (ref 150–440)
RBC: 4.31 MIL/uL (ref 3.80–5.20)
RDW: 12.7 % (ref 11.5–14.5)
WBC: 7.9 10*3/uL (ref 3.6–11.0)

## 2015-09-06 LAB — SALICYLATE LEVEL: Salicylate Lvl: <4 mg/dL (ref 2.8–30.0)

## 2015-09-06 LAB — POCT PREGNANCY, URINE: PREG TEST UR: NEGATIVE

## 2015-09-06 LAB — URINE DRUG SCREEN, QUALITATIVE (ARMC ONLY)
Amphetamines, Ur Screen: NOT DETECTED
BARBITURATES, UR SCREEN: NOT DETECTED
BENZODIAZEPINE, UR SCRN: POSITIVE — AB
CANNABINOID 50 NG, UR ~~LOC~~: NOT DETECTED
Cocaine Metabolite,Ur ~~LOC~~: NOT DETECTED
MDMA (Ecstasy)Ur Screen: NOT DETECTED
METHADONE SCREEN, URINE: NOT DETECTED
Opiate, Ur Screen: NOT DETECTED
Phencyclidine (PCP) Ur S: NOT DETECTED
TRICYCLIC, UR SCREEN: NOT DETECTED

## 2015-09-06 LAB — GLUCOSE, CAPILLARY: Glucose-Capillary: 129 mg/dL — ABNORMAL HIGH (ref 65–99)

## 2015-09-06 LAB — ACETAMINOPHEN LEVEL

## 2015-09-06 LAB — ETHANOL: Alcohol, Ethyl (B): <5 mg/dL (ref <5)

## 2015-09-06 NOTE — B[facility] Counselor (Signed)
11:37 pm  TTS counselor paged Dr. Ardyth Harps [phone]) for Psychiatric Consult.

## 2015-09-06 NOTE — ED Notes (Signed)
Pt reports taking 10  restoril, denies current SI, states she has been depressed, feels like a failure, and wanted to sleep but admits to intermittent SI.  Pt reports supportive family at home

## 2015-09-06 NOTE — BH Assessment (Signed)
Assessment Note  Frances Sanders is an 35 y.o. female presenting to the ED voluntarily after an intentional overdose of 10 Restoril (  ) tablets.  Pt states she took 5 at 6:30 and then another 5 pills at 8 pm.  She reports she had a previous OD attempt two weeks ago and was hospitalized at [facility].  She reports worsening symptoms of depression and the inability to use her coping skills to alleviate her symptoms.  She could not contract for safety and does not believe that she is mentally stable enough to be discharged from the hospital.  Pt denies any auditory/visual hallucinations.  Diagnosis: Depression  Past Medical History:  Past Medical History  Diagnosis Date  . Bipolar 1 disorder (HCC)   . Depression     Past Surgical History  Procedure Laterality Date  . Appendectomy    . Hernia repair      Family History: History reviewed. No pertinent family history.  Social History:  reports that she has been smoking.  She does not have any smokeless tobacco history on file. She reports that she does not drink alcohol or use illicit drugs.  Additional Social History:  Alcohol / Drug Use History of alcohol / drug use?: No history of alcohol / drug abuse (Pt denies)  CIWA: CIWA-Ar BP: 95/66 mmHg Pulse Rate: 66 COWS:    Allergies: No Known Allergies  Home Medications:  (Not in a hospital admission)  OB/GYN Status:  Patient's last menstrual period was 08/23/2015.  General Assessment Data Location of Assessment: [facility] ED TTS Assessment: In system Is this a Tele or Face-to-Face Assessment?: Face-to-Face Is this an Initial Assessment or a Re-assessment for this encounter?: Initial Assessment Marital status: Married [place] name: Randa Evens Is patient pregnant?: No Pregnancy Status: No Living Arrangements: Parent, Children Can pt return to current living arrangement?: Yes Admission Status: Voluntary Is patient capable of signing voluntary admission?: Yes Referral  Source: Self/Family/Friend Insurance type: None     Crisis Care Plan Living Arrangements: Parent, Children Legal Guardian: Other: (self) Name of Psychiatrist: Multimedia programmer Name of Therapist: N/A  Education Status Is patient currently in school?: No Current Grade: N/A Highest grade of school patient has completed: N/a Name of school: N/A Contact person: N/A  Risk to self with the past 6 months Suicidal Ideation: Yes-Currently Present Has patient been a risk to self within the past 6 months prior to admission? : Yes Suicidal Intent: Yes-Currently Present Has patient had any suicidal intent within the past 6 months prior to admission? : Yes Is patient at risk for suicide?: Yes Suicidal Plan?: Yes-Currently Present Has patient had any suicidal plan within the past 6 months prior to admission? : Yes Specify Current Suicidal Plan: Pt took an overdose of pills Access to Means: Yes Specify Access to Suicidal Means: Pt has access to pills What has been your use of drugs/alcohol within the last 12 months?: None reported Previous Attempts/Gestures: Yes How many times?: 2 Other Self Harm Risks: None reported Triggers for Past Attempts: Other (Comment) (Stress) Intentional Self Injurious Behavior: None Family Suicide History: No Recent stressful life event(s): Other (Comment) (stress) Persecutory voices/beliefs?: No Depression: Yes Depression Symptoms: Tearfulness, Fatigue, Loss of interest in usual pleasures, Feeling worthless/self pity Substance abuse history and/or treatment for substance abuse?: No Suicide prevention information given to non-admitted patients: Not applicable  Risk to Others within the past 6 months Homicidal Ideation: No Does patient have any lifetime risk of violence toward others beyond the six months  prior to admission? : No Thoughts of Harm to Others: No Current Homicidal Intent: No Current Homicidal Plan: No Access to Homicidal Means:  No Identified Victim: None identified History of harm to others?: No Assessment of Violence: None Noted Violent Behavior Description: None reported Does patient have access to weapons?: No Criminal Charges Pending?: No Does patient have a court date: No Is patient on probation?: No  Psychosis Hallucinations: None noted Delusions: None noted  Mental Status Report Appearance/Hygiene: In scrubs Eye Contact: Good Motor Activity: Freedom of movement Speech: Logical/coherent Level of Consciousness: Crying Mood: Sad, Depressed Affect: Sad Anxiety Level: Moderate Thought Processes: Coherent, Relevant Judgement: Partial Orientation: Person, Place, Time, Situation, Appropriate for developmental age Obsessive Compulsive Thoughts/Behaviors: None  Cognitive Functioning Concentration: Normal Memory: Recent Intact, Remote Intact IQ: Average Insight: Fair Impulse Control: Fair Appetite: Fair Weight Loss: 0 Weight Gain: 0 Sleep: No Change Total Hours of Sleep: 4 Vegetative Symptoms: None  ADLScreening [facility] Assessment Services) Patient's cognitive ability adequate to safely complete daily activities?: Yes Patient able to express need for assistance with ADLs?: Yes Independently performs ADLs?: Yes (appropriate for developmental age)  Prior Inpatient Therapy Prior Inpatient Therapy: Yes Prior Therapy Dates: 09/2015 Prior Therapy Facilty/Provider(s): [facility] Reason for Treatment: overdose  Prior Outpatient Therapy Prior Outpatient Therapy: Yes Prior Therapy Dates: current Prior Therapy Facilty/Provider(s): [facility] Reason for Treatment: depression Does patient have an ACCT team?: No Does patient have Intensive In-House Services?  : No Does patient have [facility] services? : No Does patient have P4CC services?: No  ADL Screening (condition at time of admission) Patient's cognitive ability adequate to safely complete daily activities?: Yes Patient able to express need for assistance  with ADLs?: Yes Independently performs ADLs?: Yes (appropriate for developmental age)       Abuse/Neglect Assessment (Assessment to be complete while patient is alone) Physical Abuse: Denies Verbal Abuse: Denies Sexual Abuse: Denies Exploitation of patient/patient's resources: Denies Self-Neglect: Denies Values / Beliefs Cultural Requests During Hospitalization: None Spiritual Requests During Hospitalization: None Consults Spiritual Care Consult Needed: No Social Work Consult Needed: No Merchant navy officer (For Healthcare) Does patient have an advance directive?: No    Additional Information 1:1 In Past 12 Months?: No CIRT Risk: No Elopement Risk: No Does patient have medical clearance?: Yes     Disposition:  Disposition Initial Assessment Completed for this Encounter: Yes Disposition of Patient: Other dispositions Other disposition(s): Other (Comment) (pending)  On Site Evaluation by:   Reviewed with Physician:    Manus Rudd Tyleek Smick 09/06/2015 11:40 PM

## 2015-09-06 NOTE — ED Provider Notes (Signed)
[facility] Emergency Department Provider Note  ____________________________________________  Time seen: Approximately 10:55 PM  I have reviewed the triage vital signs and the nursing notes.   HISTORY  Chief Complaint Drug Overdose    HPI Frances Sanders is a 35 y.o. female with a long history of depression and prior suicide attempts by overdose and psychiatric hospitalization who presents to the emergency department after an intentional overdose.  The patient reports that she took 10 pills of Restoril 15 mg.  She took the ingestion approximately 8:00 PM.  She states that she feels depressed, desperate, like she needs to be rescued, and states that she is not sure whether she is trying to kill her self or get attention to her issues.  She currently complains of some mild lightheadedness and a moderate global headache.  She has no focal neurological deficits, is alert and oriented, and denies nausea, vomiting, chest pain, shortness of breath, dysuria.  Symptoms as gradual in onset and severe.  She was seen at [facility] within the last couple of weeks for a similar psychiatric issue.   Past Medical History  Diagnosis Date  . Bipolar 1 disorder (HCC)   . Depression     There are no active problems to display for this patient.   Past Surgical History  Procedure Laterality Date  . Appendectomy    . Hernia repair      Current Outpatient Rx  Name  Route  Sig  Dispense  Refill  . Brexpiprazole (REXULTI) 1 MG TABS   Oral   Take 1 tablet by mouth daily.         . citalopram (CELEXA) 20 MG tablet   Oral   Take 20 mg by mouth daily.         Marland Kitchen LORazepam (ATIVAN) 1 MG tablet   Oral   Take 1 mg by mouth daily.         . temazepam (RESTORIL) 15 MG capsule   Oral   Take 15 mg by mouth at bedtime as needed for sleep.           Allergies Review of patient's allergies indicates no known allergies.  History reviewed. No pertinent family  history.  Social History Social History  Substance Use Topics  . Smoking status: Current Every Day Smoker  . Smokeless tobacco: None  . Alcohol Use: No    Review of Systems Constitutional: No fever/chills.  Generalized lightheadedness Eyes: No visual changes. ENT: No sore throat. Cardiovascular: Denies chest pain. Respiratory: Denies shortness of breath. Gastrointestinal: No abdominal pain.  No nausea, no vomiting.  No diarrhea.  No constipation. Genitourinary: Negative for dysuria. Musculoskeletal: Negative for back pain. Skin: Negative for rash. Neurological: Negative for headaches, focal weakness or numbness. Psychiatric:Worsening gradual onset depression and suicidality with intentional overdose tonight  10-point ROS otherwise negative.  ____________________________________________   PHYSICAL EXAM:  VITAL SIGNS: ED Triage Vitals  Enc Vitals Group     BP 09/06/15 2021 106/64 mmHg     Pulse Rate 09/06/15 2021 103     Resp 09/06/15 2021 18     Temp 09/06/15 2021 98 F (36.7 C)     Temp Source 09/06/15 2021 Oral     SpO2 09/06/15 2021 98 %     Weight 09/06/15 2021 115 lb (52.164 kg)     Height 09/06/15 2021 5' (1.524 m)     Head Cir --      Peak Flow --  Pain Score 09/06/15 2036 0     Pain Loc --      Pain Edu? --      Excl. in GC? --     Constitutional: Alert and oriented. Well appearing and in no acute distress. Eyes: Conjunctivae are normal. PERRL. EOMI. Head: Atraumatic. Nose: No congestion/rhinnorhea. Mouth/Throat: Mucous membranes are moist.  Oropharynx non-erythematous. Neck: No stridor.   Cardiovascular: Normal rate, regular rhythm. Grossly normal heart sounds.  Good peripheral circulation. Respiratory: Normal respiratory effort.  No retractions. Lungs CTAB. Gastrointestinal: Soft and nontender. No distention. No abdominal bruits. No CVA tenderness. Musculoskeletal: No lower extremity tenderness nor edema.  No joint effusions. Neurologic:   Normal speech and language. No gross focal neurologic deficits are appreciated.  Skin:  Skin is warm, dry and intact. No rash noted. Psychiatric: Mood and affect are normal. Speech and behavior are normal.  The patient does endorse severe depression and suicidality and admits to her intentional overdose  ____________________________________________   LABS (all labs ordered are listed, but only abnormal results are displayed)  Labs Reviewed  COMPREHENSIVE METABOLIC PANEL - Abnormal; Notable for the following:    Glucose, Bld 162 (*)    All other components within normal limits  ACETAMINOPHEN LEVEL - Abnormal; Notable for the following:    Acetaminophen (Tylenol), Serum <10 (*)    All other components within normal limits  URINE DRUG SCREEN, QUALITATIVE ([facility] ONLY) - Abnormal; Notable for the following:    Benzodiazepine, Ur Scrn POSITIVE (*)    All other components within normal limits  URINALYSIS COMPLETEWITH MICROSCOPIC ([facility] ONLY) - Abnormal; Notable for the following:    Color, Urine YELLOW (*)    APPearance CLEAR (*)    Bacteria, UA RARE (*)    Squamous Epithelial / LPF 0-5 (*)    All other components within normal limits  GLUCOSE, CAPILLARY - Abnormal; Notable for the following:    Glucose-Capillary 129 (*)    All other components within normal limits  ETHANOL  SALICYLATE LEVEL  CBC  CBG MONITORING, ED  POC URINE PREG, ED  POCT PREGNANCY, URINE   ____________________________________________  EKG  ED ECG REPORT I, Kelby Lotspeich, the attending physician, personally viewed and interpreted this ECG.  Date: 09/06/2015 EKG Time: 21:03 Rate: 90 Rhythm: normal sinus rhythm QRS Axis: normal Intervals: normal, unremarkable, no prolongations ST/T Wave abnormalities: normal Conduction Disturbances: none Narrative Interpretation: unremarkable  ____________________________________________  RADIOLOGY   No results  found.  ____________________________________________   PROCEDURES  Procedure(s) performed: None  Critical Care performed: No ____________________________________________   INITIAL IMPRESSION / ASSESSMENT AND PLAN / ED COURSE  Pertinent labs & imaging results that were available during my care of the patient were reviewed by me and considered in my medical decision making (see chart for details).  According to the recommendations from poison control, the patient was medically cleared at midnight.  Her vital signs have been stable.  She has been moved over to the [facility] and is awaiting psychiatric evaluation.  I placed her under involuntary commitment.  ____________________________________________  FINAL CLINICAL IMPRESSION(S) / ED DIAGNOSES  Final diagnoses:  Depression Suicidal Ideation Intentional overdose Involuntary commitment      NEW MEDICATIONS STARTED DURING THIS VISIT:  New Prescriptions   No medications on file     Loleta Rose, MD 09/07/15 [phone]

## 2015-09-06 NOTE — ED Notes (Signed)
Spoke w/ debra at poison control, monitor for bradycardia and hypotension.  Restoril peak 2 hours, duration at 4, if pt fine at midnight, should be medically clear.  Dr. Inocencio Homes to be notified.

## 2015-09-06 NOTE — ED Notes (Signed)
Spoke w/ pt's mother debra, w/ pt's verbal permission.  Mother given update on pt's condition and process in the ED.  Mother's number [phone]

## 2015-09-06 NOTE — ED Notes (Addendum)
Pt arrived to ED with husband with reports of intentional overdose. Pt reports taking (10 pills) Restoril  tablets. Pt reports taking 5 tablets at 6:30pm and then another 5 tablets at 8pm.   Pt states "I just wanted to go to sleep". Pt has PMH of depression with SI with previous attempts. Pt reports last OD was 2 weeks ago and she was seen at [facility]. Pt alert and tearful in triage.

## 2015-09-07 ENCOUNTER — Encounter: Payer: Self-pay | Admitting: Psychiatry

## 2015-09-07 ENCOUNTER — Inpatient Hospital Stay
Admission: RE | Admit: 2015-09-07 | Discharge: 2015-09-10 | DRG: 885 | Disposition: A | Discharge status: Home / Self Care | Payer: BLUE CROSS/BLUE SHIELD | Source: Intra-hospital | Attending: Psychiatry | Admitting: Psychiatry

## 2015-09-07 DIAGNOSIS — E8881 Metabolic syndrome: Secondary | ICD-10-CM | POA: Diagnosis present

## 2015-09-07 DIAGNOSIS — Z9049 Acquired absence of other specified parts of digestive tract: Secondary | ICD-10-CM

## 2015-09-07 DIAGNOSIS — Z9889 Other specified postprocedural states: Secondary | ICD-10-CM | POA: Diagnosis not present

## 2015-09-07 DIAGNOSIS — F332 Major depressive disorder, recurrent severe without psychotic features: Principal | ICD-10-CM | POA: Diagnosis present

## 2015-09-07 DIAGNOSIS — R45851 Suicidal ideations: Secondary | ICD-10-CM | POA: Diagnosis present

## 2015-09-07 DIAGNOSIS — Z915 Personal history of self-harm: Secondary | ICD-10-CM

## 2015-09-07 DIAGNOSIS — F17210 Nicotine dependence, cigarettes, uncomplicated: Secondary | ICD-10-CM | POA: Diagnosis present

## 2015-09-07 DIAGNOSIS — Z818 Family history of other mental and behavioral disorders: Secondary | ICD-10-CM | POA: Diagnosis not present

## 2015-09-07 DIAGNOSIS — F17200 Nicotine dependence, unspecified, uncomplicated: Secondary | ICD-10-CM | POA: Diagnosis present

## 2015-09-07 DIAGNOSIS — R4589 Other symptoms and signs involving emotional state: Secondary | ICD-10-CM

## 2015-09-07 DIAGNOSIS — Z79899 Other long term (current) drug therapy: Secondary | ICD-10-CM | POA: Diagnosis not present

## 2015-09-07 DIAGNOSIS — F411 Generalized anxiety disorder: Secondary | ICD-10-CM | POA: Diagnosis present

## 2015-09-07 DIAGNOSIS — G4700 Insomnia, unspecified: Secondary | ICD-10-CM | POA: Diagnosis present

## 2015-09-07 DIAGNOSIS — T424X1A Poisoning by benzodiazepines, accidental (unintentional), initial encounter: Secondary | ICD-10-CM | POA: Diagnosis present

## 2015-09-07 DIAGNOSIS — R4689 Other symptoms and signs involving appearance and behavior: Secondary | ICD-10-CM

## 2015-09-07 HISTORY — DX: Anxiety disorder, unspecified: F41.9

## 2015-09-07 HISTORY — DX: Insomnia, unspecified: G47.00

## 2015-09-07 LAB — LIPID PANEL
CHOL/HDL RATIO: 3.7 ratio
CHOLESTEROL: 168 mg/dL (ref 0–200)
HDL: 45 mg/dL (ref >40)
LDL CALC: 102 mg/dL — AB (ref 0–99)
TRIGLYCERIDES: 103 mg/dL (ref <150)
VLDL: 21 mg/dL (ref 0–40)

## 2015-09-07 LAB — TSH: TSH: 1.268 u[IU]/mL (ref 0.350–4.500)

## 2015-09-07 MED ORDER — BREXPIPRAZOLE 1 MG PO TABS: 1.0000 | ORAL_TABLET | Freq: Every day | ORAL | Status: DC

## 2015-09-07 MED ORDER — ESCITALOPRAM OXALATE 10 MG PO TABS
ORAL_TABLET | ORAL | Status: AC
Start: 2015-09-07 — End: 2015-09-07
  Administered 2015-09-07: 20 mg via ORAL
  Filled 2015-09-07: qty 2

## 2015-09-07 MED ORDER — LORAZEPAM 1 MG PO TABS
1.0000 mg | ORAL_TABLET | Freq: Every day | ORAL | Status: DC
Start: 2015-09-07 — End: 2015-09-07

## 2015-09-07 MED ORDER — CITALOPRAM HYDROBROMIDE 20 MG PO TABS
20.0000 mg | ORAL_TABLET | Freq: Every day | ORAL | Status: DC
Start: 2015-09-07 — End: 2015-09-07
  Filled 2015-09-07: qty 1

## 2015-09-07 MED ORDER — BREXPIPRAZOLE 1 MG PO TABS
1.0000 mg | ORAL_TABLET | Freq: Every day | ORAL | Status: DC
  Administered 2015-09-07: 1 mg via ORAL
  Filled 2015-09-07 (×2): qty 1

## 2015-09-07 MED ORDER — LORAZEPAM 1 MG PO TABS
1.0000 mg | ORAL_TABLET | Freq: Four times a day (QID) | ORAL | Status: DC | PRN
  Administered 2015-09-07: 1 mg via ORAL

## 2015-09-07 MED ORDER — IBUPROFEN 600 MG PO TABS
600.0000 mg | ORAL_TABLET | ORAL | Status: DC | PRN
  Administered 2015-09-07: 600 mg via ORAL
  Filled 2015-09-07: qty 1

## 2015-09-07 MED ORDER — ACETAMINOPHEN 325 MG PO TABS: 650.0000 mg | ORAL_TABLET | Freq: Four times a day (QID) | ORAL | Status: DC | PRN

## 2015-09-07 MED ORDER — LORAZEPAM 1 MG PO TABS
1.0000 mg | ORAL_TABLET | Freq: Every day | ORAL | Status: DC
  Filled 2015-09-07: qty 1

## 2015-09-07 MED ORDER — LORAZEPAM 1 MG PO TABS
1.0000 mg | ORAL_TABLET | Freq: Four times a day (QID) | ORAL | Status: DC | PRN
  Administered 2015-09-08: 1 mg via ORAL

## 2015-09-07 MED ORDER — TRAZODONE HCL 100 MG PO TABS
100.0000 mg | ORAL_TABLET | Freq: Every day | ORAL | Status: DC
  Administered 2015-09-07 – 2015-09-08 (×2): 100 mg via ORAL
  Filled 2015-09-07 (×2): qty 1

## 2015-09-07 MED ORDER — NICOTINE 21 MG/24HR TD PT24
MEDICATED_PATCH | TRANSDERMAL | Status: AC
  Administered 2015-09-07: 21 mg via TRANSDERMAL
  Filled 2015-09-07: qty 1

## 2015-09-07 MED ORDER — ESCITALOPRAM OXALATE 10 MG PO TABS
20.0000 mg | ORAL_TABLET | Freq: Every day | ORAL | Status: DC
  Administered 2015-09-08 – 2015-09-10 (×3): 20 mg via ORAL
  Filled 2015-09-07 (×3): qty 2

## 2015-09-07 MED ORDER — NICOTINE 21 MG/24HR TD PT24
21.0000 mg | MEDICATED_PATCH | Freq: Every day | TRANSDERMAL | Status: DC
  Administered 2015-09-08 – 2015-09-10 (×3): 21 mg via TRANSDERMAL
  Filled 2015-09-07 (×3): qty 1

## 2015-09-07 MED ORDER — LORAZEPAM 1 MG PO TABS
1.0000 mg | ORAL_TABLET | Freq: Every day | ORAL | Status: DC
  Administered 2015-09-07 – 2015-09-09 (×3): 1 mg via ORAL
  Filled 2015-09-07 (×4): qty 1

## 2015-09-07 MED ORDER — ESCITALOPRAM OXALATE 10 MG PO TABS
20.0000 mg | ORAL_TABLET | Freq: Every day | ORAL | Status: DC
  Administered 2015-09-07: 20 mg via ORAL

## 2015-09-07 MED ORDER — MAGNESIUM HYDROXIDE 400 MG/5ML PO SUSP: 30.0000 mL | Freq: Every day | ORAL | Status: DC | PRN

## 2015-09-07 MED ORDER — TRAZODONE HCL 100 MG PO TABS: 100.0000 mg | ORAL_TABLET | Freq: Every day | ORAL | Status: DC

## 2015-09-07 MED ORDER — NICOTINE 21 MG/24HR TD PT24
21.0000 mg | MEDICATED_PATCH | Freq: Every day | TRANSDERMAL | Status: DC
  Administered 2015-09-07: 21 mg via TRANSDERMAL

## 2015-09-07 MED ORDER — ALUM & MAG HYDROXIDE-SIMETH 200-200-20 MG/5ML PO SUSP: 30.0000 mL | ORAL | Status: DC | PRN

## 2015-09-07 MED ORDER — BREXPIPRAZOLE 1 MG PO TABS
1.0000 | ORAL_TABLET | Freq: Every day | ORAL | Status: DC
Start: 2015-09-07 — End: 2015-09-07

## 2015-09-07 MED ORDER — IBUPROFEN 600 MG PO TABS
600.0000 mg | ORAL_TABLET | ORAL | Status: DC | PRN
  Administered 2015-09-09 (×2): 600 mg via ORAL
  Filled 2015-09-07 (×3): qty 1

## 2015-09-07 NOTE — ED Notes (Signed)
Pt. Noted in room resting quietly;. No complaints or concerns voiced. No distress or abnormal behavior noted. Will continue to monitor with security cameras. Q 15 minute rounds continue. 

## 2015-09-07 NOTE — ED Notes (Signed)
Pt ambulated to bathroom w/o difficulty.

## 2015-09-07 NOTE — ED Notes (Signed)
Report received from Nyra Market., RN. Pt. Alert and oriented in no distress; verbalizing having SIdenies having HI, AVH and pain.  Pt. Instructed to come to me with problems or concerns.Will continue to monitor for safety via security cameras and Q 15 minute checks.

## 2015-09-07 NOTE — Progress Notes (Signed)
Patient ID: Frances Sanders, female   DOB: 11-May-1981, 35 y.o.   MRN: [id number]  Pt admitted from [facility] Bipolar/Depression after intentional OD on 10 Restoril tablets, pt states that she has "alot going on" and feels "overwhelmed", pt did not want to discuss in detail her recent stressors other than that she has been on short term disability because of her mental health issues and she has recently started the process for long term disability and is worried she will lose her job at [employer] after working there for 12 years, pt lives with supportive husband and 3 year old child, states that her parents are also very supportive, pt was on celexa at home but felt as though it was no longer working, was started on lexapro here in ED today, denies pain, denies SI/HI/AVH presently but states that she SI is on and off, skin/contraband search done, skin intact, no contraband found, oriented pt to unit and rules, pleasant and cooperative throughout the admission process.

## 2015-09-07 NOTE — ED Notes (Signed)
Patient received breakfast tray 

## 2015-09-07 NOTE — [facility] Suicide Risk Assessment (Signed)
[facility] Admission Suicide Risk Assessment   Nursing information obtained from:  Patient Demographic factors:  Caucasian Current Mental Status:  Suicidal ideation indicated by patient Loss Factors:  Decline in physical health Historical Factors:  Prior suicide attempts Risk Reduction Factors:  Living with another person, especially a relative, Responsible for children under 33 years of age, Positive social support, Positive therapeutic relationship  Total Time spent with patient: 1 hour Principal Problem: Severe recurrent major depression without psychotic features (HCC) Diagnosis:   Patient Active Problem List   Diagnosis Date Noted  . Suicidal behavior [F48.9] 09/07/2015  . Severe recurrent major depression without psychotic features (HCC) [F33.2] 09/07/2015  . Benzodiazepine overdose [T42.4X1A] 09/07/2015  . Tobacco use disorder [F17.200] 09/07/2015   Subjective Data: Depression, suicidal ideation.  Continued Clinical Symptoms:  Alcohol Use Disorder Identification Test Final Score (AUDIT): 0 The "Alcohol Use Disorders Identification Test", Guidelines for Use in Primary Care, Second Edition.  World Science writer [facility]). Score between 0-7:  no or low risk or alcohol related problems. Score between 8-15:  moderate risk of alcohol related problems. Score between 16-19:  high risk of alcohol related problems. Score 20 or above:  warrants further diagnostic evaluation for alcohol dependence and treatment.   CLINICAL FACTORS:   Bipolar Disorder:   Depressive phase Depression:   Impulsivity Insomnia Severe   Musculoskeletal: Strength & Muscle Tone: within normal limits Gait & Station: normal Patient leans: N/A  Psychiatric Specialty Exam: Review of Systems  All other systems reviewed and are negative.   Blood pressure 105/63, pulse 75, temperature 98.1 F (36.7 C), temperature source Oral, resp. rate 20, height 5' (1.524 m), weight 51.256 kg (113 lb), last menstrual period  08/23/2015, SpO2 98 %.Body mass index is 22.07 kg/(m^2).  General Appearance: Casual  Eye Contact::  Good  Speech:  Clear and Coherent  Volume:  Normal  Mood:  Depressed  Affect:  Appropriate  Thought Process:  Goal Directed  Orientation:  Full (Time, Place, and Person)  Thought Content:  Delusions and Paranoid Ideation  Suicidal Thoughts:  No  Homicidal Thoughts:  No  Memory:  Immediate;   Fair Recent;   Fair Remote;   Fair  Judgement:  Impaired  Insight:  Shallow  Psychomotor Activity:  Normal  Concentration:  Fair  Recall:  [employer] of Knowledge:Fair  Language: Fair  Akathisia:  No  Handed:  Right  AIMS (if indicated):     Assets:  Communication Skills Desire for Improvement Financial Resources/Insurance Housing Intimacy Physical Health Resilience Social Support  Sleep:     Cognition: WNL  ADL's:  Intact    COGNITIVE FEATURES THAT CONTRIBUTE TO RISK:  None    SUICIDE RISK:   Moderate:  Frequent suicidal ideation with limited intensity, and duration, some specificity in terms of plans, no associated intent, good self-control, limited dysphoria/symptomatology, some risk factors present, and identifiable protective factors, including available and accessible social support.  PLAN OF CARE: Hospital admission, medication management, discharge planning.  Ms. Tye Maryland saw is a 35 year old female with a history of bipolar depression admitted after suicide attempt by Restoril overdose.  1. Suicidal ideation. The patient denies feeling suicidal any longer. She is able to contract for safety in the hospital.   2. Mood. She has been maintained on Rexulti and Celexa in the community but believes that the Lexapro works best for her. Dr. Toni Amend started her on Lexapro in the emergency room.  3. Insomnia. She was started on trazodone.  4. Anxiety. She  has Ativan prescribed in the community will continue.  5. Smoking. Nicotine patch is available.   6. Disposition. She  will be discharged to home with family. She will follow up with Dr. Nemiah Commander.  I certify that inpatient services furnished can reasonably be expected to improve the patient's condition.   Kristine Linea, MD 09/07/2015, 2:58 PM

## 2015-09-07 NOTE — Progress Notes (Signed)
Recreation Therapy Notes  INPATIENT RECREATION THERAPY ASSESSMENT  Patient Details Name: Frances Sanders MRN: [id number] DOB: Nov 05, 1980 Today's Date: 09/07/2015  Patient Stressors: Work, Other (Comment) (Was on short term disability, but has been on it so long it is about to be long term disability and she will lose her job. She has worked for [employer] for 12 years. Overwhelmed with situation at work)  Coping Skills:   Isolate, Art/Dance, Music, Sports, Other (Comment) (Spend time with mom, go out with husband)  Personal Challenges: Communication, Concentration, Decision-Making, Expressing Yourself, Self-Esteem/Confidence, Stress Management  Leisure Interests (2+):  Music - Listen, Individual - Other (Comment) (Watch movies)  Awareness of Community Resources:  Yes  Community Resources:  Park, Other (Comment) ([facility])  Current Use: Yes  If no, Barriers?:    Patient Strengths:  Personality, love for others  Patient Identified Areas of Improvement:  Dealing with stress  Current Recreation Participation:  Listen to music, sook, play with animals  Patient Goal for Hospitalization:  To learn how to deal with stress  [place] of Residence:  [place] of Residence:  [place]   Current SI (including self-harm):  No  Current HI:  No  Consent to Intern Participation: N/A   Frances Sanders, LRT/CTRS 09/07/2015, 2:43 PM

## 2015-09-07 NOTE — [facility] Group Notes (Signed)
[facility] LCSW Group Therapy  09/07/2015 5:04 PM  Type of Therapy:  Group Therapy  Participation Level:  Active  Participation Quality:  Attentive  Affect:  Depressed  Cognitive:  Alert  Insight:  Improving  Engagement in Therapy:  Improving  Modes of Intervention:  Discussion, Education, Socialization and Support  Summary of Progress/Problems: Feelings around Relapse. Group members discussed the meaning of relapse and shared personal stories of relapse, how it affected them and others, and how they perceived themselves during this time. Group members were encouraged to identify triggers, warning signs and coping skills used when facing the possibility of relapse. Social supports were discussed and explored in detail. Georjean attended group and stayed the entire time. She discussed receiving bad news prior to admission. She plans to rely on her support system rather than doing everything on her own.   [employer] MSW, LCSWA  09/07/2015, 5:04 PM

## 2015-09-07 NOTE — Tx Team (Signed)
Initial Interdisciplinary Treatment Plan   PATIENT STRESSORS: Health problems Occupational concerns Need for Medication Adjustment   PATIENT STRENGTHS: Ability for insight Average or above average intelligence Communication skills Motivation for treatment/growth Supportive family/friends   PROBLEM LIST: Problem List/Patient Goals Date to be addressed Date deferred Reason deferred Estimated date of resolution  Depressed Mood      Anxiety      Suicidal ideation                                           DISCHARGE CRITERIA:  Improved stabilization in mood, thinking, and/or behavior Need for constant or close observation no longer present Reduction of life-threatening or endangering symptoms to within safe limits Verbal commitment to aftercare and medication compliance  PRELIMINARY DISCHARGE PLAN: Attend aftercare/continuing care group Outpatient therapy Return to previous living arrangement  PATIENT/FAMIILY INVOLVEMENT: This treatment plan has been presented to and reviewed with the patient, Frances Sanders.  The patient and family have been given the opportunity to ask questions and make suggestions.  Lauris Poag 09/07/2015, 11:52 AM

## 2015-09-07 NOTE — ED Notes (Signed)
Received a call from Debra from Po[employer] Control to check on patient's status; per this Clinical research associate, "is resting quietly; had c/o of a headache earlier tonight @ 1 a.m.; ER-MD was notified; vtial signs were taken; no c/o NVD; has a steady gait; is alert and oriented. Patient received Ibuprofen 600 mgs p.o.; and that a Psych Consult has been ordered.

## 2015-09-07 NOTE — ED Provider Notes (Signed)
-----------------------------------------   6:39 AM on 09/07/2015 -----------------------------------------   Blood pressure 94/67, pulse 68, temperature 98 F (36.7 C), temperature source Oral, resp. rate 16, height 5' (1.524 m), weight 52.164 kg, last menstrual period 08/23/2015, SpO2 97 %.  The patient had no acute events since last update.  Calm and cooperative at this time.  Disposition is pending per Psychiatry/Behavioral Medicine team recommendations.  Medically clear.  Loleta Rose, MD 09/07/15 [phone]

## 2015-09-07 NOTE — ED Notes (Signed)
Pain Assessment - Pain Assessment: 0-10 Pain Type: Acute pain Pain Location: Head Pain Orientation: Anterior Pain Descriptors / Indicators: Aching Pain Frequency: Rarely Patients Stated Pain Goal: 6 Pain Intervention(s): Medication (See eMAR) (received Ibuprofen 600 mgs; per Dr. York Cerise) Multiple Pain Sites: No  Other flowsheet entries - Pain Intervention(s): Medication (See eMAR) (received Ibuprofen 600 mgs; per Dr. York Cerise)  Pain Assessment - Work-Related Injury: No  Pain Screening - Response to Interventions: (09/07/2015 @ 0435 pt. stated, "my headache is gone.") Clinical Progression: Resolved  Pain Assessment - Date Pain First Started: 09/07/15 Result of Injury: No

## 2015-09-07 NOTE — Progress Notes (Signed)
Recreation Therapy Notes  Date: 02.03.17 Time: 3:00 pm Location: Craft Room  Group Topic: Communication, Problem Solving, Teamwork  Goal Area(s) Addresses:  Patient will work in teams towards shared goal. Patient will verbalize skills needed to make activities successful. Patient will verbalize benefit of using skills identify to reach post d/c goals.  Behavioral Response: Attentive, Interactive  Intervention: Landing Pad  Activity: Patients were given 15 straws and about 2.5-3 feet of tape and instructed to build a contraption to catch a golf ball dropped from about 4 feet.  Education: LRT educated patients on why these skills are important.  Education Outcome: Acknowledges education/In group clarification offered  Clinical Observations/Feedback: Patient worked with peers to build contraption. Patient used effective teamwork, communication, and problem solving skills. Patient contributed to group discussion by stating what skills her team used.  Jacquelynn Cree, LRT/CTRS 09/07/2015 4:48 PM

## 2015-09-07 NOTE — Consult Note (Signed)
[facility] Consult   Reason for Consult:  Consult for 35 year old woman in the emergency room on IVC. Took overdose of benzodiazepines. Referring Physician:  Karma Greaser Patient Identification: NEVIN KOZUCH MRN:  [id number] Principal Diagnosis: Severe recurrent major depression without psychotic features [facility]) Diagnosis:   Patient Active Problem List   Diagnosis Date Noted  . Suicidal behavior [F48.9] 09/07/2015  . Severe recurrent major depression without psychotic features ([place]) [F33.2] 09/07/2015  . Benzodiazepine overdose [T42.4X1A] 09/07/2015    Total Time spent with patient: 1 hour  Subjective:   LAKSHMI SUNDEEN is a 35 y.o. female patient admitted with "I just wanted to go to sleep".  HPI:  Patient interviewed. Chart reviewed. Current and old notes. Labs reviewed vitals reviewed. Case discussed with nursing staff. 35 year old woman who reports that last night she impulsively took 8 of her 15 mg Restoril tablets. This is not even a medicine she is currently prescribed one she had left over. She says that she was "just trying to go to sleep". Patient is ambivalent about how frankly she was wanting to die but is having severe depression. Mood stays down and depressed all the time. Feels sad and hopeless. Describes herself as overwhelmed by everything in her life. She is not having any hallucinations or psychotic symptoms. She does have a lot of anxiety and sounds like she has some worries that border on the obsessive-compulsive. Patient has been sleeping poorly at night. Appetite is been fine. No other specific physical symptoms. She is compliant with her current medications but is concerned that they were not effective enough. Patient was just recently hospitalized at [facility] on the psychiatric ward about 1-1/2 weeks ago for similar incident. Doesn't report any specific stress but has chronic and worsening worries about finances especially now that she is not able  to work.  Social history: Patient lives with her husband and 45 year old son. She has been employed by [employer] but is on long-term disability and believes that she will probably be losing her job. She is denying any problems between her and her husband. She seems to be particularly close to her mother who lives nearby.  Medical history: Patient denies any significant medical problems outside of the psychiatric.  Substance abuse history: Denies any use of alcohol or abuse of any other drugs. Denies any problems with substance abuse in the past.  Past Psychiatric History: Patient has been struggling with depression for several years. She's had a couple of prior admissions 1 to our hospital and one more recently at [facility] She does have a history of suicide attempts by overdose in the past. Diagnosis quoted his bipolar disorder depression although I don't see any clear history of a manic episode. Patient tells me that Lexapro which she took in the past was very helpful. More recently her outpatient psychiatrist has been prescribing citalopram and more recently [place]. Patient has no history of violence in the past.  Risk to Self: Suicidal Ideation: Yes-Currently Present Suicidal Intent: Yes-Currently Present Is patient at risk for suicide?: Yes Suicidal Plan?: Yes-Currently Present Specify Current Suicidal Plan: Pt took an overdose of pills Access to Means: Yes Specify Access to Suicidal Means: Pt has access to pills What has been your use of drugs/alcohol within the last 12 months?: None reported How many times?: 2 Other Self Harm Risks: None reported Triggers for Past Attempts: Other (Comment) (Stress) Intentional Self Injurious Behavior: None Risk to Others: Homicidal Ideation: No Thoughts of Harm to Others: No Current Homicidal  Intent: No Current Homicidal Plan: No Access to Homicidal Means: No Identified Victim: None identified History of harm to others?: No Assessment of Violence:  None Noted Violent Behavior Description: None reported Does patient have access to weapons?: No Criminal Charges Pending?: No Does patient have a court date: No Prior Inpatient Therapy: Prior Inpatient Therapy: Yes Prior Therapy Dates: 09/2015 Prior Therapy Facilty/Provider(s): [facility] Reason for Treatment: overdose Prior Outpatient Therapy: Prior Outpatient Therapy: Yes Prior Therapy Dates: current Prior Therapy Facilty/Provider(s): [facility] Reason for Treatment: depression Does patient have an ACCT team?: No Does patient have Intensive In-House Services?  : No Does patient have [facility] services? : No Does patient have P4CC services?: No  Past Medical History:  Past Medical History  Diagnosis Date  . Bipolar 1 disorder ([place])   . Depression     Past Surgical History  Procedure Laterality Date  . Appendectomy    . Hernia repair     Family History: History reviewed. No pertinent family history. Family Psychiatric  History: Patient states there are several people in her family who have had problems with depression and anxiety including her mother and a couple of aunts and more extended family. No one in the family has killed themselves that she is aware of Social History:  History  Alcohol Use No     History  Drug Use No    Social History   Social History  . Marital Status: Married    Spouse Name: N/A  . Number of Children: N/A  . Years of Education: N/A   Social History Main Topics  . Smoking status: Current Every Day Smoker  . Smokeless tobacco: None  . Alcohol Use: No  . Drug Use: No  . Sexual Activity: Not Asked   Other Topics Concern  . None   Social History Narrative  . None   Additional Social History:    Allergies:  No Known Allergies  Labs:  Results for orders placed or performed during the hospital encounter of 09/06/15 (from the past 48 hour(s))  Comprehensive metabolic panel     Status: Abnormal   Collection Time: 09/06/15  8:48 PM  Result Value  Ref Range   Sodium 138 135 - 145 mmol/L   Potassium 3.6 3.5 - 5.1 mmol/L   Chloride 106 101 - 111 mmol/L   CO2 23 22 - 32 mmol/L   Glucose, Bld 162 (H) 65 - 99 mg/dL   BUN 8 6 - 20 mg/dL   Creatinine, Ser 0.69 0.44 - 1.00 mg/dL   Calcium 9.0 8.9 - 10.3 mg/dL   Total Protein 7.1 6.5 - 8.1 g/dL   Albumin 4.8 3.5 - 5.0 g/dL   AST 24 15 - 41 U/L   ALT 28 14 - 54 U/L   Alkaline Phosphatase 93 38 - 126 U/L   Total Bilirubin 0.4 0.3 - 1.2 mg/dL   GFR calc non Af Amer >60 >60 mL/min   GFR calc Af Amer >60 >60 mL/min    Comment: (NOTE) The eGFR has been calculated using the CKD EPI equation. This calculation has not been validated in all clinical situations. eGFR's persistently <60 mL/min signify possible Chronic Kidney Disease.    Anion gap 9 5 - 15  Ethanol (ETOH)     Status: None   Collection Time: 09/06/15  8:48 PM  Result Value Ref Range   Alcohol, Ethyl (B) <5 <5 mg/dL    Comment:        LOWEST DETECTABLE LIMIT FOR SERUM ALCOHOL  IS 5 mg/dL FOR MEDICAL PURPOSES ONLY   Salicylate level     Status: None   Collection Time: 09/06/15  8:48 PM  Result Value Ref Range   Salicylate Lvl <3.7 2.8 - 30.0 mg/dL  Acetaminophen level     Status: Abnormal   Collection Time: 09/06/15  8:48 PM  Result Value Ref Range   Acetaminophen (Tylenol), Serum <10 (L) 10 - 30 ug/mL    Comment:        THERAPEUTIC CONCENTRATIONS VARY SIGNIFICANTLY. A RANGE OF 10-30 ug/mL MAY BE AN EFFECTIVE CONCENTRATION FOR MANY PATIENTS. HOWEVER, SOME ARE BEST TREATED AT CONCENTRATIONS OUTSIDE THIS RANGE. ACETAMINOPHEN CONCENTRATIONS >150 ug/mL AT 4 HOURS AFTER INGESTION AND >50 ug/mL AT 12 HOURS AFTER INGESTION ARE OFTEN ASSOCIATED WITH TOXIC REACTIONS.   CBC     Status: None   Collection Time: 09/06/15  8:48 PM  Result Value Ref Range   WBC 7.9 3.6 - 11.0 K/uL   RBC 4.31 3.80 - 5.20 MIL/uL   Hemoglobin 14.4 12.0 - 16.0 g/dL   HCT 41.5 35.0 - 47.0 %   MCV 96.2 80.0 - 100.0 fL   MCH 33.4 26.0 - 34.0  pg   MCHC 34.8 32.0 - 36.0 g/dL   RDW 12.7 11.5 - 14.5 %   Platelets 334 150 - 440 K/uL  Urine Drug Screen, Qualitative ([facility] only)     Status: Abnormal   Collection Time: 09/06/15  8:49 PM  Result Value Ref Range   Tricyclic, Ur Screen NONE DETECTED NONE DETECTED   Amphetamines, Ur Screen NONE DETECTED NONE DETECTED   MDMA (Ecstasy)Ur Screen NONE DETECTED NONE DETECTED   Cocaine Metabolite,Ur [place] NONE DETECTED NONE DETECTED   Opiate, Ur Screen NONE DETECTED NONE DETECTED   Phencyclidine (PCP) Ur S NONE DETECTED NONE DETECTED   Cannabinoid 50 Ng, Ur  NONE DETECTED NONE DETECTED   Barbiturates, Ur Screen NONE DETECTED NONE DETECTED   Benzodiazepine, Ur Scrn POSITIVE (A) NONE DETECTED   Methadone Scn, Ur NONE DETECTED NONE DETECTED    Comment: (NOTE) 106  Tricyclics, urine               Cutoff 1000 ng/mL 200  Amphetamines, urine             Cutoff 1000 ng/mL 300  MDMA (Ecstasy), urine           Cutoff 500 ng/mL 400  Cocaine Metabolite, urine       Cutoff 300 ng/mL 500  Opiate, urine                   Cutoff 300 ng/mL 600  Phencyclidine (PCP), urine      Cutoff 25 ng/mL 700  Cannabinoid, urine              Cutoff 50 ng/mL 800  Barbiturates, urine             Cutoff 200 ng/mL 900  Benzodiazepine, urine           Cutoff 200 ng/mL 1000 Methadone, urine                Cutoff 300 ng/mL 1100 1200 The urine drug screen provides only a preliminary, unconfirmed 1300 analytical test result and should not be used for non-medical 1400 purposes. Clinical consideration and professional judgment should 1500 be applied to any positive drug screen result due to possible 1600 interfering substances. A more specific alternate chemical method 1700 must be used in order to obtain a confirmed  analytical result.  1800 Gas chromato graphy / mass spectrometry (GC/MS) is the preferred 1900 confirmatory method.   Urinalysis complete, with microscopic ([facility] only)     Status: Abnormal   Collection Time:  09/06/15  8:49 PM  Result Value Ref Range   Color, Urine YELLOW (A) YELLOW   APPearance CLEAR (A) CLEAR   Glucose, UA NEGATIVE NEGATIVE mg/dL   Bilirubin Urine NEGATIVE NEGATIVE   Ketones, ur NEGATIVE NEGATIVE mg/dL   Specific Gravity, Urine 1.010 1.005 - 1.030   Hgb urine dipstick NEGATIVE NEGATIVE   pH 6.0 5.0 - 8.0   Protein, ur NEGATIVE NEGATIVE mg/dL   Nitrite NEGATIVE NEGATIVE   Leukocytes, UA NEGATIVE NEGATIVE   RBC / HPF 0-5 0 - 5 RBC/hpf   WBC, UA NONE SEEN 0 - 5 WBC/hpf   Bacteria, UA RARE (A) NONE SEEN   Squamous Epithelial / LPF 0-5 (A) NONE SEEN  Glucose, capillary     Status: Abnormal   Collection Time: 09/06/15  8:57 PM  Result Value Ref Range   Glucose-Capillary 129 (H) 65 - 99 mg/dL  Pregnancy, urine POC     Status: None   Collection Time: 09/06/15  9:00 PM  Result Value Ref Range   Preg Test, Ur NEGATIVE NEGATIVE    Comment:        THE SENSITIVITY OF THIS METHODOLOGY IS >24 mIU/mL     Current Facility-Administered Medications  Medication Dose Route Frequency Provider Last Rate Last Dose  . Brexpiprazole TABS 1 tablet  1 tablet Oral QHS Gonzella Lex, MD      . escitalopram (LEXAPRO) tablet 20 mg  20 mg Oral Daily Gonzella Lex, MD   20 mg at 09/07/15 0959  . ibuprofen (ADVIL,MOTRIN) tablet 600 mg  600 mg Oral Q4H PRN Hinda Kehr, MD   600 mg at 09/07/15 0119  . LORazepam (ATIVAN) tablet 1 mg  1 mg Oral QHS Gonzella Lex, MD      . LORazepam (ATIVAN) tablet 1 mg  1 mg Oral Q6H PRN Gonzella Lex, MD   1 mg at 09/07/15 0959  . nicotine (NICODERM CQ - dosed in mg/24 hours) patch 21 mg  21 mg Transdermal Daily Daymon Larsen, MD   21 mg at 09/07/15 0958  . traZODone (DESYREL) tablet 100 mg  100 mg Oral QHS Gonzella Lex, MD       Current Outpatient Prescriptions  Medication Sig Dispense Refill  . Brexpiprazole (REXULTI) 1 MG TABS Take 1 tablet by mouth daily.    . citalopram (CELEXA) 20 MG tablet Take 20 mg by mouth daily.    Marland Kitchen LORazepam (ATIVAN) 1 MG  tablet Take 1 mg by mouth daily.    . temazepam (RESTORIL) 15 MG capsule Take 15 mg by mouth at bedtime as needed for sleep.      Musculoskeletal: Strength & Muscle Tone: within normal limits Gait & Station: normal Patient leans: N/A  Psychiatric Specialty Exam: Review of Systems  Constitutional: Negative.   HENT: Negative.   Eyes: Negative.   Respiratory: Negative.   Cardiovascular: Negative.   Gastrointestinal: Negative.   Musculoskeletal: Negative.   Skin: Negative.   Neurological: Negative.   Psychiatric/Behavioral: Positive for depression and suicidal ideas. Negative for hallucinations, memory loss and substance abuse. The patient is nervous/anxious and has insomnia.     Blood pressure 94/67, pulse 68, temperature 98 F (36.7 C), temperature source Oral, resp. rate 16, height 5' (1.524 m), weight 52.164 kg (115  lb), last menstrual period 08/23/2015, SpO2 97 %.Body mass index is 22.46 kg/(m^2).  General Appearance: Disheveled  Eye Sport and exercise psychologist::  Fair  Speech:  Slow  Volume:  Decreased  Mood:  Depressed  Affect:  Tearful  Thought Process:  Linear  Orientation:  Full (Time, Place, and Person)  Thought Content:  Obsessions and Rumination  Suicidal Thoughts:  Yes.  with intent/plan  Homicidal Thoughts:  No  Memory:  Immediate;   Good Recent;   Good Remote;   Good  Judgement:  Fair  Insight:  Fair  Psychomotor Activity:  Decreased  Concentration:  Fair  Recall:  [employer] of Knowledge:Fair  Language: Fair  Akathisia:  No  Handed:  Right  AIMS (if indicated):     Assets:  Communication Skills Desire for Improvement Housing Physical Health Resilience Social Support  ADL's:  Intact  Cognition: WNL  Sleep:      Treatment Plan Summary: Daily contact with patient to assess and evaluate symptoms and progress in treatment, Medication management and Plan 35 year old woman with a history of severe depression current episode that has been present for months and worsening.  This is the second time in 2 weeks that she has intentionally taken an overdose. Although she is asking if she can go home she remains tearful and looks very overwhelmed. Because of the recent suicide attempts and ongoing severe symptoms I am going to uphold the IVC and admit her to the psychiatric ward. 15 minute checks or continuous observation in place because of recent suicidal behavior. After reviewing medicine with her I propose a change by discontinuing Celexa 20 mg and substituting Lexapro 20 mg. Patient agrees to the plan. She does not appear to need any specific treatment for the benzodiazepine overdose. Low dose of as needed Ativan will be provided at least for the moment. Reassurance and supportive therapy. Patient agrees to plan.  Disposition: Recommend psychiatric Inpatient admission when medically cleared. Supportive therapy provided about ongoing stressors.  Alethia Berthold, MD 09/07/2015 10:05 AM

## 2015-09-07 NOTE — ED Notes (Signed)
Pt. To ED-[facility] from ED ambulatory without difficulty, to room #5  . Report from RN. Pt. Is alert and oriented, warm and dry with noted (1) superficial laceration on left inner arm Pt. Verbalizes having SI with stating, "I feel like I need to be rescued." ; currently denies having HI, and AVH. Pt. Calm and cooperative; c/o having a headache with v/s= 97.0; 65; 17; 95/65; 02 @ 99%;. Pt. Made aware of security cameras and Q15 minute rounds. Pt. Encouraged to let Nursing staff know of any concerns or needs.

## 2015-09-07 NOTE — BH Assessment (Signed)
Patient is to be admitted to [facility] by Dr. Toni Amend.  Attending Physician will be Dr. Jennet Maduro.   Patient has been assigned to room 311, by [facility] Charge Nurse [place].   Intake Paper Work has been signed and placed on patient chart.  ER staff is aware of the admission Rivka Barbara, ER Sect.; Dr. Huel Cote, ER MD; Amy H. Patient's Nurse & Lowanda Foster, Patient Access).

## 2015-09-07 NOTE — [facility] Group Notes (Signed)
[facility] LCSW Aftercare Discharge Planning Group Note   09/07/2015 11:34 AM  Patient did not attend.  Jasmine Lewis, CSW Intern 09/07/15   

## 2015-09-07 NOTE — H&P (Signed)
Psychiatric Admission Assessment Adult  Patient Identification: Frances Sanders MRN:  [id number] Date of Evaluation:  09/07/2015 Chief Complaint:  depression Principal Diagnosis: Severe recurrent major depression without psychotic features [facility]) Diagnosis:   Patient Active Problem List   Diagnosis Date Noted  . Suicidal behavior [F48.9] 09/07/2015  . Severe recurrent major depression without psychotic features ([place]) [F33.2] 09/07/2015  . Benzodiazepine overdose [T42.4X1A] 09/07/2015  . Tobacco use disorder [F17.200] 09/07/2015   History of Present Illness:  Identifying data. [name] is a 35 year old female with a history of depression, anxiety, psychosis, and mood instability.  Chief complaint. "I wasn't trying to kill myself."  History of present illness. Information was obtained from the patient and the chart. The patient has a long history of depression and mood lability. She has been a patient of Dr. Vena Rua at [facility]. She has been maintained on a combination of Rexulti and Celexa for the past 6 months. She has been on short-term disability from her job at [employer]. She has not been able to return to work and now applied for [employer] disability. She has learned that once her short-term disability expired she lost her job. Even though she does not feel that she couldn't return to work she became rather upset about it as she is losing her benefits. Last week she was admitted to [facility] after suicide by overdose. She overdosed on Restoril. This is a medication prescribed a while ago. She initially denied that she was trying to kill herself rather she was trying to go to sleep. She took 4 tablets and then 4 tablets again of 15 mg Restoril. Reportedly she could not go to sleep. Later during the interview she says that lately she has periods of time when she feels actively hopeless and believes that if she could sleep for a long time that when she wakes up everything will be fine. Other  than job loss she reports no stressors. Her family is very supportive. They do not have financial difficulties. She is healthy. She was diagnosed with bipolar disorder and had one psychotic episode. She reports that at times she gets paranoid. She reports symptoms of anxiety for which she is prescribed Ativan. In addition to generalized anxiety symptoms she has symptoms suggestive of OCD with excessive worries. At present she does not have any compulsions but in the past she used to clean excessively. She reports poor sleep, decreased appetite, anhedonia, feeling of guilt and hopelessness worthlessness, poor energy and concentration, social isolation, crying else and suicidal thinking. She denies alcohol or illicit substance use. She denies misusing of her medications.  Past psychiatric history. She was hospitalized twice once here for a psychotic break and recently at [facility] after suicide attempt. She follows up with Dr. Vena Rua.  Family psychiatric history. Depression and anxiety. Everybody is on benzodiazepine.  Social history. She just lost her job at [employer]. She lives with her husband and 42 year old son. Her parents are very supportive.  Total Time spent with patient: 1 hour  Past Psychiatric History: Bipolar disorder.  Risk to Self: Is patient at risk for suicide?: Yes Risk to Others:   Prior Inpatient Therapy:   Prior Outpatient Therapy:    Alcohol Screening: 1. How often do you have a drink containing alcohol?: Never 9. Have you or someone else been injured as a result of your drinking?: No 10. Has a relative or friend or a doctor or another health worker been concerned about your drinking or suggested you cut down?: No Alcohol  Use Disorder Identification Test Final Score (AUDIT): 0 Brief Intervention: AUDIT score less than 7 or less-screening does not suggest unhealthy drinking-brief intervention not indicated Substance Abuse History in the last 12 months:  No. Consequences of Substance  Abuse: NA Previous Psychotropic Medications: Yes  Psychological Evaluations: No  Past Medical History:  Past Medical History  Diagnosis Date  . Bipolar 1 disorder ([place])   . Depression   . Anxiety   . Insomnia     Past Surgical History  Procedure Laterality Date  . Appendectomy    . Hernia repair     Family History: History reviewed. No pertinent family history. Family Psychiatric  History: Depression and anxiety. Tobacco Screening: @FLOW ([phone])::1)@ Social History:  History  Alcohol Use No     History  Drug Use No    Additional Social History:                           Allergies:  No Known Allergies Lab Results:  Results for orders placed or performed during the hospital encounter of 09/06/15 (from the past 48 hour(s))  Comprehensive metabolic panel     Status: Abnormal   Collection Time: 09/06/15  8:48 PM  Result Value Ref Range   Sodium 138 135 - 145 mmol/L   Potassium 3.6 3.5 - 5.1 mmol/L   Chloride 106 101 - 111 mmol/L   CO2 23 22 - 32 mmol/L   Glucose, Bld 162 (H) 65 - 99 mg/dL   BUN 8 6 - 20 mg/dL   Creatinine, Ser 0.69 0.44 - 1.00 mg/dL   Calcium 9.0 8.9 - 10.3 mg/dL   Total Protein 7.1 6.5 - 8.1 g/dL   Albumin 4.8 3.5 - 5.0 g/dL   AST 24 15 - 41 U/L   ALT 28 14 - 54 U/L   Alkaline Phosphatase 93 38 - 126 U/L   Total Bilirubin 0.4 0.3 - 1.2 mg/dL   GFR calc non Af Amer >60 >60 mL/min   GFR calc Af Amer >60 >60 mL/min    Comment: (NOTE) The eGFR has been calculated using the CKD EPI equation. This calculation has not been validated in all clinical situations. eGFR's persistently <60 mL/min signify possible Chronic Kidney Disease.    Anion gap 9 5 - 15  Ethanol (ETOH)     Status: None   Collection Time: 09/06/15  8:48 PM  Result Value Ref Range   Alcohol, Ethyl (B) <5 <5 mg/dL    Comment:        LOWEST DETECTABLE LIMIT FOR SERUM ALCOHOL IS 5 mg/dL FOR MEDICAL PURPOSES ONLY   Salicylate level     Status: None   Collection Time:  09/06/15  8:48 PM  Result Value Ref Range   Salicylate Lvl <6.2 2.8 - 30.0 mg/dL  Acetaminophen level     Status: Abnormal   Collection Time: 09/06/15  8:48 PM  Result Value Ref Range   Acetaminophen (Tylenol), Serum <10 (L) 10 - 30 ug/mL    Comment:        THERAPEUTIC CONCENTRATIONS VARY SIGNIFICANTLY. A RANGE OF 10-30 ug/mL MAY BE AN EFFECTIVE CONCENTRATION FOR MANY PATIENTS. HOWEVER, SOME ARE BEST TREATED AT CONCENTRATIONS OUTSIDE THIS RANGE. ACETAMINOPHEN CONCENTRATIONS >150 ug/mL AT 4 HOURS AFTER INGESTION AND >50 ug/mL AT 12 HOURS AFTER INGESTION ARE OFTEN ASSOCIATED WITH TOXIC REACTIONS.   CBC     Status: None   Collection Time: 09/06/15  8:48 PM  Result Value Ref Range  WBC 7.9 3.6 - 11.0 K/uL   RBC 4.31 3.80 - 5.20 MIL/uL   Hemoglobin 14.4 12.0 - 16.0 g/dL   HCT 41.5 35.0 - 47.0 %   MCV 96.2 80.0 - 100.0 fL   MCH 33.4 26.0 - 34.0 pg   MCHC 34.8 32.0 - 36.0 g/dL   RDW 12.7 11.5 - 14.5 %   Platelets 334 150 - 440 K/uL  Urine Drug Screen, Qualitative ([facility] only)     Status: Abnormal   Collection Time: 09/06/15  8:49 PM  Result Value Ref Range   Tricyclic, Ur Screen NONE DETECTED NONE DETECTED   Amphetamines, Ur Screen NONE DETECTED NONE DETECTED   MDMA (Ecstasy)Ur Screen NONE DETECTED NONE DETECTED   Cocaine Metabolite,Ur [place] NONE DETECTED NONE DETECTED   Opiate, Ur Screen NONE DETECTED NONE DETECTED   Phencyclidine (PCP) Ur S NONE DETECTED NONE DETECTED   Cannabinoid 50 Ng, Ur [place] NONE DETECTED NONE DETECTED   Barbiturates, Ur Screen NONE DETECTED NONE DETECTED   Benzodiazepine, Ur Scrn POSITIVE (A) NONE DETECTED   Methadone Scn, Ur NONE DETECTED NONE DETECTED    Comment: (NOTE) 324  Tricyclics, urine               Cutoff 1000 ng/mL 200  Amphetamines, urine             Cutoff 1000 ng/mL 300  MDMA (Ecstasy), urine           Cutoff 500 ng/mL 400  Cocaine Metabolite, urine       Cutoff 300 ng/mL 500  Opiate, urine                   Cutoff 300 ng/mL 600   Phencyclidine (PCP), urine      Cutoff 25 ng/mL 700  Cannabinoid, urine              Cutoff 50 ng/mL 800  Barbiturates, urine             Cutoff 200 ng/mL 900  Benzodiazepine, urine           Cutoff 200 ng/mL 1000 Methadone, urine                Cutoff 300 ng/mL 1100 1200 The urine drug screen provides only a preliminary, unconfirmed 1300 analytical test result and should not be used for non-medical 1400 purposes. Clinical consideration and professional judgment should 1500 be applied to any positive drug screen result due to possible 1600 interfering substances. A more specific alternate chemical method 1700 must be used in order to obtain a confirmed analytical result.  1800 Gas chromato graphy / mass spectrometry (GC/MS) is the preferred 1900 confirmatory method.   Urinalysis complete, with microscopic ([facility] only)     Status: Abnormal   Collection Time: 09/06/15  8:49 PM  Result Value Ref Range   Color, Urine YELLOW (A) YELLOW   APPearance CLEAR (A) CLEAR   Glucose, UA NEGATIVE NEGATIVE mg/dL   Bilirubin Urine NEGATIVE NEGATIVE   Ketones, ur NEGATIVE NEGATIVE mg/dL   Specific Gravity, Urine 1.010 1.005 - 1.030   Hgb urine dipstick NEGATIVE NEGATIVE   pH 6.0 5.0 - 8.0   Protein, ur NEGATIVE NEGATIVE mg/dL   Nitrite NEGATIVE NEGATIVE   Leukocytes, UA NEGATIVE NEGATIVE   RBC / HPF 0-5 0 - 5 RBC/hpf   WBC, UA NONE SEEN 0 - 5 WBC/hpf   Bacteria, UA RARE (A) NONE SEEN   Squamous Epithelial / LPF 0-5 (A) NONE SEEN  Glucose, capillary     Status: Abnormal   Collection Time: 09/06/15  8:57 PM  Result Value Ref Range   Glucose-Capillary 129 (H) 65 - 99 mg/dL  Pregnancy, urine POC     Status: None   Collection Time: 09/06/15  9:00 PM  Result Value Ref Range   Preg Test, Ur NEGATIVE NEGATIVE    Comment:        THE SENSITIVITY OF THIS METHODOLOGY IS >24 mIU/mL     Metabolic Disorder Labs:  No results found for: HGBA1C, MPG No results found for: PROLACTIN No results found for:  CHOL, TRIG, HDL, CHOLHDL, VLDL, LDLCALC  Current Medications: Current Facility-Administered Medications  Medication Dose Route Frequency Provider Last Rate Last Dose  . acetaminophen (TYLENOL) tablet 650 mg  650 mg Oral Q6H PRN Gonzella Lex, MD      . alum & mag hydroxide-simeth (MAALOX/MYLANTA) 200-200-20 MG/5ML suspension 30 mL  30 mL Oral Q4H PRN Gonzella Lex, MD      . Brexpiprazole TABS 1 mg  1 mg Oral QHS Gonzella Lex, MD      . Derrill Memo ON 09/08/2015] escitalopram (LEXAPRO) tablet 20 mg  20 mg Oral Daily Gonzella Lex, MD      . ibuprofen (ADVIL,MOTRIN) tablet 600 mg  600 mg Oral Q4H PRN Gonzella Lex, MD      . LORazepam (ATIVAN) tablet 1 mg  1 mg Oral QHS John T Clapacs, MD      . LORazepam (ATIVAN) tablet 1 mg  1 mg Oral Q6H PRN Gonzella Lex, MD      . magnesium hydroxide (MILK OF MAGNESIA) suspension 30 mL  30 mL Oral Daily PRN Gonzella Lex, MD      . Derrill Memo ON 09/08/2015] nicotine (NICODERM CQ - dosed in mg/24 hours) patch 21 mg  21 mg Transdermal Daily Gonzella Lex, MD      . traZODone (DESYREL) tablet 100 mg  100 mg Oral QHS Gonzella Lex, MD       PTA Medications: Prescriptions prior to admission  Medication Sig Dispense Refill Last Dose  . Brexpiprazole (REXULTI) 1 MG TABS Take 1 tablet by mouth daily.     Marland Kitchen LORazepam (ATIVAN) 1 MG tablet Take 1 mg by mouth daily.     . temazepam (RESTORIL) 15 MG capsule Take 15 mg by mouth at bedtime as needed for sleep.       Musculoskeletal: Strength & Muscle Tone: within normal limits Gait & Station: normal Patient leans: N/A  Psychiatric Specialty Exam: Reveals physical examination performed in the emergency room and agree with the findings Physical Exam  Nursing note and vitals reviewed.   Review of Systems  All other systems reviewed and are negative.   Blood pressure 105/63, pulse 75, temperature 98.1 F (36.7 C), temperature source Oral, resp. rate 20, height 5' (1.524 m), weight 51.256 kg (113 lb), last  menstrual period 08/23/2015, SpO2 98 %.Body mass index is 22.07 kg/(m^2).  See SRA.                                                  Sleep:        Treatment Plan Summary: Daily contact with patient to assess and evaluate symptoms and progress in treatment and Medication management   Ms. Hutchins saw is a 35 year old  female with a history of bipolar depression admitted after suicide attempt by Restoril overdose.  1. Suicidal ideation. The patient denies feeling suicidal any longer. She is able to contract for safety in the hospital.   2. Mood. She has been maintained on Rexulti and Celexa in the community but believes that the Lexapro works best for her. Dr. Weber Cooks started her on Lexapro in the emergency room.  3. Insomnia. She was started on trazodone.  4. Anxiety. She has Ativan prescribed in the community will continue.  5. Smoking. Nicotine patch is available.   6. Disposition. She will be discharged to home with family. She will follow up with Dr. Vena Rua.  Observation Level/Precautions:  15 minute checks  Laboratory:  CBC Chemistry Profile UDS UA  Psychotherapy:    Medications:    Consultations:    Discharge Concerns:    Estimated LOS:  Other:     I certify that inpatient services furnished can reasonably be expected to improve the patient's condition.    Orson Slick, MD 2/3/20173:30 PM

## 2015-09-07 NOTE — ED Notes (Signed)
Pt ambulatory to [facility] w/o difficulty accompanied by this nurse and officer

## 2015-09-07 NOTE — ED Notes (Signed)
Patient is cooperative and understands transfer process.  All belongings returned to patient.  Ambulatory in NAD.

## 2015-09-07 NOTE — ED Notes (Signed)
Patient spoke with husband and mother via phone. Very tearful. States her medications are not working. Meeting with psychiatrist at present.  Maintained on 15 minute checks and observation by security camera for safety.Maintained on 15 minute checks and observation by security camera for safety.

## 2015-09-07 NOTE — ED Notes (Signed)
ENVIRONMENTAL ASSESSMENT Potentially harmful objects out of patient reach: Yes Personal belongings secured: Yes Patient dressed in hospital provided attire only: Yes Plastic bags out of patient reach: Yes Patient care equipment (cords, cables, call bells, lines, and drains) shortened, removed, or accounted for: Yes Equipment and supplies removed from bottom of stretcher: Yes Potentially toxic materials out of patient reach: Yes Sharps container removed or out of patient reach: Yes  Patient resting quietly in room. No noted distress or abnormal behaviors noted. Will continue 15 minute checks and observation by security camera for safety.Patient resting quietly in room. No noted distress or abnormal behaviors noted. Will continue 15 minute checks and observation by security camera for safety.

## 2015-09-08 LAB — PROLACTIN: PROLACTIN: 14.9 ng/mL (ref 4.8–23.3)

## 2015-09-08 LAB — HEMOGLOBIN A1C: HEMOGLOBIN A1C: 5.5 % (ref 4.0–6.0)

## 2015-09-08 MED ORDER — BREXPIPRAZOLE 1 MG PO TABS
1.0000 mg | ORAL_TABLET | Freq: Every morning | ORAL | Status: DC
  Filled 2015-09-08: qty 1

## 2015-09-08 NOTE — Plan of Care (Signed)
Problem: Diagnosis: Increased Risk For Suicide Attempt Goal: STG-Patient Will Comply With Medication Regime Outcome: Progressing Pt complying with medication regimen.     

## 2015-09-08 NOTE — [facility] Group Notes (Signed)
[facility] LCSW Group Therapy  09/08/2015 3:25 PM  Type of Therapy:  Group Therapy  Participation Level:  Did Not Attend  Modes of Intervention:  Discussion, Education, Socialization and Support  Summary of Progress/Problems: Boundaries: Patients defined boundaries and discussed the importance of them. Patients identified their own boundaries and how they feel when they are crossed. Patients discussed ways to create and/ or improve their personal boundaries.   Aira Sallade L Maggy Wyble MSW, LCSWA  09/08/2015, 3:25 PM  

## 2015-09-08 NOTE — Progress Notes (Signed)
D: Observed pt interacting in day room. Patient alert and oriented x4. Patient denies SI/HI/AVH. Pt affect is anxious. Pt stated "I feel so much better today." Pt believes that her change to taking Lexapro is having a very positive effect on her "mood, socialization, thoughts and everything." Pt was smiling appropriately when working with Clinical research associate. Pt appears to be socializing well with peers.  A: Offered active listening and support. Provided therapeutic communication. Administered scheduled medications.  R: Pt pleasant and cooperative. Pt medication compliant. Will continue Q15 min. checks. Safety maintained.

## 2015-09-08 NOTE — Progress Notes (Signed)
D: Voice of goal today learning how to handle stress . Patient stated slept good last night .Stated appetite is good and energy level  Is normal. Stated concentration is good . Stated on Depression scale 2 , hopeless1 and anxiety 3 .( low 0-10 high) Denies suicidal  homicidal ideations  .  No auditory hallucinations  No pain concerns . Appropriate ADL'S. Interacting with peers and staff.  A: Encourage patient participation with unit programming . Instruction  Given on  Medication , verbalize understanding. R: Voice no other concerns. Staff continue to monitor

## 2015-09-08 NOTE — Progress Notes (Signed)
[facility] MD Progress Note  09/08/2015 7:11 PM Frances Sanders  MRN:  [id number]  Principal Problem: Severe recurrent major depression without psychotic features [facility]) Diagnosis:   Patient Active Problem List   Diagnosis Date Noted  . Suicidal behavior [F48.9] 09/07/2015  . Severe recurrent major depression without psychotic features (HCC) [F33.2] 09/07/2015  . Benzodiazepine overdose [T42.4X1A] 09/07/2015  . Tobacco use disorder [F17.200] 09/07/2015    Subjective: The patient was just admitted to the inpatient unit from the emergency room yesterday. She reports twice on the a lot of Restoril did have passive suicidal thoughts prior to that as a result of having a lot of stress about being terminated from job. The patient has been on short-term disability for the past 6 months and then found out that she has to go to long-term disability and will be terminated from her job and lose her benefits. She is applying for Social Security disability. The patient denies any current active or passive suicidal thoughts. She does admit to ongoing problems with insomnia however. She also admits to having levels of anxiety and sometimes having intermittent paranoid thoughts that other people can think what she hears. She denies any feelings of being self-conscious or difficulty. Seeing. She says she has been struggling with depression which is triggered by feelings of being overwhelmed with finances, her workload at her job, having a child and household responsibilities. She has been seeing a psychiatrist 2014 for depression but has not has any significant improvement. She was transitioned from Celexa to Lexapro and very much likes the Lexapro compared to the Celexa. She denies any auditory or visual hallucinations. She does have some racing thoughts at night associated with the insomnia. She says she was diagnosed with bipolar disorder but she is not endorsing any clear manic episodes in the past. She has been  calm and cooperative on the unit and compliant with staff. She has been compliant with medications. Vital signs are stable. Appetite is good. She did have problems with sleep last night. The patient denies any marital conflict.    Past psychiatric history. She was hospitalized twice once here for a psychotic break and recently at [facility] after suicide attempt. She follows up with Dr. Percell Belt.  Family psychiatric history. Depression and anxiety. Everybody is on benzodiazepine.  Social history. She just lost her job at [employer]. She lives with her husband and 76 year old son. Her parents are very supportive.   Total Time spent with patient: 30 minutes     Past Medical History:  Past Medical History  Diagnosis Date  . Bipolar 1 disorder (HCC)   . Depression   . Anxiety   . Insomnia     Past Surgical History  Procedure Laterality Date  . Appendectomy    . Hernia repair     Family History: History reviewed. No pertinent family history.  Social History:  History  Alcohol Use No     History  Drug Use No    Social History   Social History  . Marital Status: Married    Spouse Name: N/A  . Number of Children: N/A  . Years of Education: N/A   Social History Main Topics  . Smoking status: Current Every Day Smoker -- 1.00 packs/day  . Smokeless tobacco: None  . Alcohol Use: No  . Drug Use: No  . Sexual Activity: Not Asked   Other Topics Concern  . None   Social History Narrative        Sleep: Fair  Appetite:  Good  Current Medications: Current Facility-Administered Medications  Medication Dose Route Frequency Provider Last Rate Last Dose  . acetaminophen (TYLENOL) tablet 650 mg  650 mg Oral Q6H PRN Audery Amel, MD      . alum & mag hydroxide-simeth (MAALOX/MYLANTA) 200-200-20 MG/5ML suspension 30 mL  30 mL Oral Q4H PRN Audery Amel, MD      . Brexpiprazole TABS 1 mg  1 mg Oral q morning - 10a Darliss Ridgel, MD   1 mg at 09/08/15 1030  . escitalopram (LEXAPRO)  tablet 20 mg  20 mg Oral Daily Audery Amel, MD   20 mg at 09/08/15 0914  . ibuprofen (ADVIL,MOTRIN) tablet 600 mg  600 mg Oral Q4H PRN Audery Amel, MD      . LORazepam (ATIVAN) tablet 1 mg  1 mg Oral QHS Audery Amel, MD   1 mg at 09/07/15 2142  . LORazepam (ATIVAN) tablet 1 mg  1 mg Oral Q6H PRN Audery Amel, MD   1 mg at 09/08/15 1425  . magnesium hydroxide (MILK OF MAGNESIA) suspension 30 mL  30 mL Oral Daily PRN Audery Amel, MD      . nicotine (NICODERM CQ - dosed in mg/24 hours) patch 21 mg  21 mg Transdermal Daily Audery Amel, MD   21 mg at 09/08/15 0914  . traZODone (DESYREL) tablet 100 mg  100 mg Oral QHS Audery Amel, MD   100 mg at 09/07/15 2142    Lab Results:  Results for orders placed or performed during the hospital encounter of 09/07/15 (from the past 48 hour(s))  Hemoglobin A1c     Status: None   Collection Time: 09/07/15  2:29 PM  Result Value Ref Range   Hgb A1c MFr Bld 5.5 4.0 - 6.0 %  Lipid panel, fasting     Status: Abnormal   Collection Time: 09/07/15  2:29 PM  Result Value Ref Range   Cholesterol 168 0 - 200 mg/dL   Triglycerides 161 <096 mg/dL   HDL 45 >04 mg/dL   Total CHOL/HDL Ratio 3.7 RATIO   VLDL 21 0 - 40 mg/dL   LDL Cholesterol 540 (H) 0 - 99 mg/dL    Comment:        Total Cholesterol/HDL:CHD Risk Coronary Heart Disease Risk Table                     Men   Women  1/2 Average Risk   3.4   3.3  Average Risk       5.0   4.4  2 X Average Risk   9.6   7.1  3 X Average Risk  23.4   11.0        Use the calculated Patient Ratio above and the CHD Risk Table to determine the patient's CHD Risk.        ATP III CLASSIFICATION (LDL):  <100     mg/dL   Optimal  981-191  mg/dL   Near or Above                    Optimal  130-159  mg/dL   Borderline  478-295  mg/dL   High  >621     mg/dL   Very High   TSH     Status: None   Collection Time: 09/07/15  2:29 PM  Result Value Ref Range   TSH 1.268 0.350 - 4.500 uIU/mL  Prolactin     Status:  None   Collection Time: 09/07/15  2:29 PM  Result Value Ref Range   Prolactin 14.9 4.8 - 23.3 ng/mL    Comment: (NOTE) Performed At: [facility] [address] [place], [place] [id number] Mila Homer MD WU:[id number]     Physical Findings: AIMS: Facial and Oral Movements Muscles of Facial Expression: None, normal Lips and Perioral Area: None, normal Jaw: None, normal Tongue: None, normal,Extremity Movements Upper (arms, wrists, hands, fingers): None, normal Lower (legs, knees, ankles, toes): None, normal, Trunk Movements Neck, shoulders, hips: None, normal, Overall Severity Severity of abnormal movements (highest score from questions above): None, normal Incapacitation due to abnormal movements: None, normal Patient's awareness of abnormal movements (rate only patient's report): No Awareness, Dental Status Current problems with teeth and/or dentures?: No Does patient usually wear dentures?: No  CIWA:    COWS:     Musculoskeletal: Strength & Muscle Tone: within normal limits Gait & Station: normal Patient leans: N/A  Psychiatric Specialty Exam: Review of Systems  Constitutional: Negative.  Negative for fever, chills, weight loss, malaise/fatigue and diaphoresis.  HENT: Negative.  Negative for ear discharge, ear pain, hearing loss, nosebleeds and tinnitus.   Eyes: Negative.  Negative for blurred vision, double vision, photophobia, pain and discharge.  Respiratory: Negative.  Negative for cough, hemoptysis, sputum production and shortness of breath.   Cardiovascular: Negative.  Negative for chest pain, palpitations, orthopnea, claudication and leg swelling.  Gastrointestinal: Negative.  Negative for heartburn, nausea, vomiting, abdominal pain, diarrhea and constipation.  Genitourinary: Negative.  Negative for dysuria, urgency and frequency.  Musculoskeletal: Negative.  Negative for myalgias, back pain, joint pain and neck pain.  Skin: Negative.  Negative for  itching and rash.  Neurological: Negative.  Negative for dizziness, tingling, tremors, sensory change, speech change, focal weakness and headaches.  Endo/Heme/Allergies: Negative.  Negative for environmental allergies. Does not bruise/bleed easily.    Blood pressure 104/67, pulse 87, temperature 98.4 F (36.9 C), temperature source Oral, resp. rate 20, height 5' (1.524 m), weight 51.256 kg (113 lb), last menstrual period 08/23/2015, SpO2 98 %.Body mass index is 22.07 kg/(m^2).  General Appearance: Fairly Groomed  Patent attorney::  Good  Speech:  Clear and Coherent and Normal Rate  Volume:  Normal  Mood:  "OK"  Affect:  Depressed  Thought Process:  Goal Directed, Linear and Logical  Orientation:  Full (Time, Place, and Person)  Thought Content:  Depressive  Suicidal Thoughts:  No  Homicidal Thoughts:  No  Memory:  Immediate;   Good Recent;   Good Remote;   Good  Judgement:  Good  Insight:  Good  Psychomotor Activity:  Normal  Concentration:  Good  Recall:  Good  Fund of Knowledge:Good  Language: Good  Akathisia:  No  Handed:  Right  AIMS (if indicated):     Assets:  Communication Skills Desire for Improvement Housing Physical Health Social Support Transportation  ADL's:  Intact  Cognition: WNL  Sleep:  Number of Hours: 6.25   Treatment Plan Summary:   Treatment Plan Summary: Daily contact with patient to assess and evaluate symptoms and progress in treatment and Medication management   Ms. Hutchins saw is a 35 year old female with a history of bipolar depression admitted after suicide attempt by Restoril overdose.  1. Suicidal ideation. The patient denies feeling suicidal any longer. She is able to contract for safety in the hospital.   2. Mood. She has been maintained on Rexulti and Celexa in the  community but believes that the Lexapro works best for her. She was started on Lexapro last night. She is now on Lexapro 20mg  po daily for depression. Will change Rexulti to  1mg  po in the MORNING rather than bedtime.   3. Insomnia. She was started on trazodone 100mg  po nightly for insomnia.  4. Anxiety. She has Ativan prescribed in the community will continue.  5. Smoking. Nicotine patch is available.   6. Disposition. She will be discharged to home with family. She will follow up with Dr. Percell Belt  Daily contact with patient to assess and evaluate symptoms and progress in treatment and Medication management  Levora Angel, MD 09/08/2015, 7:11 PM

## 2015-09-08 NOTE — Progress Notes (Signed)
D: Patient has a bright affect. She has been seen interacting on the milieu. Her only complaint is that another patient has been making her feel uncomfortable. Staff have intervened. Patient denies SI/HI/AVH. Patient denies pain.  A: Medication given with education. Encouragement provided.  R: Patient compliant with medication. She has remained calm and cooperative. Safety maintained with 15 min checks.

## 2015-09-08 NOTE — Plan of Care (Signed)
Problem: Ineffective individual coping Goal: LTG: Patient will report a decrease in negative feelings Outcome: Progressing Denies suicidal ideations      

## 2015-09-09 MED ORDER — TRAZODONE HCL 50 MG PO TABS
150.0000 mg | ORAL_TABLET | Freq: Every day | ORAL | Status: DC
  Administered 2015-09-09: 150 mg via ORAL
  Filled 2015-09-09: qty 1

## 2015-09-09 MED ORDER — DOCUSATE SODIUM 100 MG PO CAPS
100.0000 mg | ORAL_CAPSULE | Freq: Two times a day (BID) | ORAL | Status: DC | PRN
  Administered 2015-09-09: 100 mg via ORAL
  Filled 2015-09-09: qty 1

## 2015-09-09 MED ORDER — BREXPIPRAZOLE 1 MG PO TABS
2.0000 mg | ORAL_TABLET | Freq: Every morning | ORAL | Status: DC
  Administered 2015-09-09 – 2015-09-10 (×2): 2 mg via ORAL
  Filled 2015-09-09 (×3): qty 2

## 2015-09-09 NOTE — Plan of Care (Signed)
Problem: Ineffective individual coping Goal: STG: Patient will remain free from self harm Outcome: Progressing Patient denied SI and contracted for safety.

## 2015-09-09 NOTE — Progress Notes (Signed)
Patient observed to be anxious especially about her medications, medication education done. Pt. verbalized understanding. Pt. also c/o back pain, medicated with Motrin, and verbalized relief. Pt. attended groups, seen interacting appropriately with peers and staff.

## 2015-09-09 NOTE — Progress Notes (Signed)
2030: Patient currently in the dayroom with staff and peers. Alert and oriented and interacting pleasantly with staff and peers. Denies suicidal/homicidal thoughts. Was encouraged to talk to staff as needed. Safety precautions maintained.

## 2015-09-09 NOTE — [facility] Group Notes (Signed)
[facility] Group Notes:  (Nursing/MHT/Case Management/Adjunct)  Date:  09/09/2015  Time:  8:30 AM  Type of Therapy:  Community Meeting   Participation Level:  Active  Participation Quality:  Appropriate, Attentive, Sharing and Supportive  Affect:  Appropriate  Cognitive:  Alert, Appropriate and Oriented  Insight:  Improving  Engagement in Group:  Improving  Modes of Intervention:  Discussion and Education  Summary of Progress/Problems:  Frances Sanders De'Chelle Tennyson Kallen 09/09/2015, 6:10 PM

## 2015-09-09 NOTE — [facility] Group Notes (Signed)
[facility] LCSW Group Therapy  09/09/2015 2:51 PM  Type of Therapy:  Group Therapy  Participation Level:  Minimal  Participation Quality:  Attentive  Affect:  Flat  Cognitive:  Alert  Insight:  Improving  Engagement in Therapy:  Improving  Modes of Intervention:  Activity, Discussion, Education, Socialization and Support  Summary of Progress/Problems:Feelings around diagnosis: Patients discussed what mental health means to them and how it has impacted their lives. Patients discussed receiving a diagnosis and how it made them feel. They were encouraged to share experiences related to mental health diagnosis and how other people have reacted. Patient attended group and stayed the entire time. She discussed the stigma around mental health and the difficulties she has experienced explaining it to others. She states people have told her she is "doing it for attention."   [employer]  MSW, LCSWA   09/09/2015, 2:51 PM

## 2015-09-09 NOTE — [facility] Counselor (Signed)
Adult Comprehensive Assessment  Patient ID: Frances Sanders, female   DOB: 23-Oct-1980, 35 y.o.   MRN: [id number]  Information Source: Information source: Patient  Current Stressors:  Educational / Learning stressors: None reported  Employment / Job issues: Pt is on short term disability and is seeking long term.  Family Relationships: None reported  Financial / Lack of resources (include bankruptcy): Limited income.  Housing / Lack of housing: None reported  Physical health (include injuries & life threatening diseases): None reported  Social relationships: None reported  Substance abuse: None reported  Bereavement / Loss: Loss of job.   Living/Environment/Situation:  Living Arrangements: Spouse/significant other Living conditions (as described by patient or guardian): Pt lives with husband and wife.  How long has patient lived in current situation?: few years  What is atmosphere in current home: Comfortable, Paramedic, Supportive  Family History:  Marital status: Married Number of Years Married: 5 What types of issues is patient dealing with in the relationship?: None reported  Are you sexually active?: Yes What is your sexual orientation?: Heterosexual  Has your sexual activity been affected by drugs, alcohol, medication, or emotional stress?: None reported  Does patient have children?: Yes How many children?: 1 How is patient's relationship with their children?: 93 year old son, close relationship.   Childhood History:  By whom was/is the patient raised?: Both parents Description of patient's relationship with caregiver when they were a child: good relationship  Patient's description of current relationship with people who raised him/her: good relationship  How were you disciplined when you got in trouble as a child/adolescent?: None reported  Does patient have siblings?: No Did patient suffer any verbal/emotional/physical/sexual abuse as a child?: No Did patient suffer  from severe childhood neglect?: No Has patient ever been sexually abused/assaulted/raped as an adolescent or adult?: No Was the patient ever a victim of a crime or a disaster?: No Witnessed domestic violence?: No Has patient been effected by domestic violence as an adult?: No  Education:  Highest grade of school patient has completed: 12th  Currently a student?: No Learning disability?: No  Employment/Work Situation:   Employment situation: Leave of absence Patient's job has been impacted by current illness: Yes Describe how patient's job has been impacted: Pt reports she cannot work  What is the longest time patient has a held a job?: 12 years Where was the patient employed at that time?: [employer]  Has patient ever been in the [employer]?: No Are There Guns or Other Weapons in Your Home?: No  Financial Resources:   Surveyor, quantity resources: Support from parents / caregiver (Short term disability from job) Does patient have a Lawyer or guardian?: No  Alcohol/Substance Abuse:   What has been your use of drugs/alcohol within the last 12 months?: Denies use If attempted suicide, did drugs/alcohol play a role in this?: No Alcohol/Substance Abuse Treatment Hx: Denies past history Has alcohol/substance abuse ever caused legal problems?: No  Social Support System:   Conservation officer, nature Support System: Good Describe Community Support System: family, friends  Type of faith/religion: Christainity  How does patient's faith help to cope with current illness?: prayer, reading bible, fellowship   Leisure/Recreation:   Leisure and Hobbies: paint by #s, shopping, outdoors, reading   Strengths/Needs:   What things does the patient do well?: cooking, crafty, decorating, fast typer, funny  In what areas does patient struggle / problems for patient: depression   Discharge Plan:   Does patient have access to transportation?: Yes Will  patient be returning to same living situation after  discharge?: Yes Currently receiving community mental health services: Yes (From Whom) [facility] ) Does patient have financial barriers related to discharge medications?: No  Summary/Recommendations:    Patient is a 35 year old female admitted to with a diagnosis of Major Depression. Patient presented to the hospital with SI, and depression. Patient reports primary triggers for admission were loss of job. Patient will benefit from crisis stabilization, medication evaluation, group therapy and psycho education in addition to case management for discharge. At discharge, it is recommended that patient remain compliant with established discharge plan and continued treatment.   Kennya Schwenn L Dorine Duffey.MSW, LCSWA  09/09/2015

## 2015-09-09 NOTE — Progress Notes (Signed)
[facility] MD Progress Note  09/09/2015 9:42 AM Frances Sanders  MRN:  [id number]  Principal Problem: Severe recurrent major depression without psychotic features [facility]) Diagnosis:   Patient Active Problem List   Diagnosis Date Noted  . Suicidal behavior [F48.9] 09/07/2015  . Severe recurrent major depression without psychotic features (HCC) [F33.2] 09/07/2015  . Benzodiazepine overdose [T42.4X1A] 09/07/2015  . Tobacco use disorder [F17.200] 09/07/2015    Subjective: The patient did not sleep very well last night and is complaining of some neck pain this morning from the pillow in the bed. He slept 5.5 hours per nursing. She is anxious about going to her appointment with the attorney for disability services. She denies any current active or passive suicidal thoughts and regrets overdosing on pills. She denies any auditory or visual hallucinations. No paranoid thoughts or delusions. She has been eating well and has been visible on the unit, socializing appropriately with other patients. Of the neck pain, she denies any other new somatic complaints.   Time spent discussing ways to cope with anxiety and using mindfulness techniques to help decrease anxiety. Times spent focusing on some cognitive behavioral therapy techniques to help decrease negative thoughts. The patient feels others are expressing towards her. Times spent discussing personalization and all or nothing thinking.  Past psychiatric history. She was hospitalized twice once here for a psychotic break and recently at [facility] after suicide attempt. She follows up with Dr. Percell Belt.  Family psychiatric history. Depression and anxiety. Everybody is on benzodiazepine.  Social history. She just lost her job at [employer]. She lives with her husband and 68 year old son. Her parents are very supportive.   Total Time spent with patient: 30 minutes     Past Medical History:  Past Medical History  Diagnosis Date  . Bipolar 1 disorder (HCC)   .  Depression   . Anxiety   . Insomnia     Past Surgical History  Procedure Laterality Date  . Appendectomy    . Hernia repair     Family History: History reviewed. No pertinent family history.  Social History:  History  Alcohol Use No     History  Drug Use No    Social History   Social History  . Marital Status: Married    Spouse Name: N/A  . Number of Children: N/A  . Years of Education: N/A   Social History Main Topics  . Smoking status: Current Every Day Smoker -- 1.00 packs/day  . Smokeless tobacco: None  . Alcohol Use: No  . Drug Use: No  . Sexual Activity: Not Asked   Other Topics Concern  . None   Social History Narrative        Sleep: Fair  Appetite:  Good  Current Medications: Current Facility-Administered Medications  Medication Dose Route Frequency Provider Last Rate Last Dose  . acetaminophen (TYLENOL) tablet 650 mg  650 mg Oral Q6H PRN Audery Amel, MD      . alum & mag hydroxide-simeth (MAALOX/MYLANTA) 200-200-20 MG/5ML suspension 30 mL  30 mL Oral Q4H PRN Audery Amel, MD      . Brexpiprazole TABS 2 mg  2 mg Oral q morning - 10a Darliss Ridgel, MD      . escitalopram (LEXAPRO) tablet 20 mg  20 mg Oral Daily Audery Amel, MD   20 mg at 09/09/15 0909  . ibuprofen (ADVIL,MOTRIN) tablet 600 mg  600 mg Oral Q4H PRN Audery Amel, MD      .  LORazepam (ATIVAN) tablet 1 mg  1 mg Oral QHS Audery Amel, MD   1 mg at 09/08/15 2200  . LORazepam (ATIVAN) tablet 1 mg  1 mg Oral Q6H PRN Audery Amel, MD   1 mg at 09/08/15 1425  . magnesium hydroxide (MILK OF MAGNESIA) suspension 30 mL  30 mL Oral Daily PRN Audery Amel, MD      . nicotine (NICODERM CQ - dosed in mg/24 hours) patch 21 mg  21 mg Transdermal Daily Audery Amel, MD   21 mg at 09/09/15 0908  . traZODone (DESYREL) tablet 150 mg  150 mg Oral QHS Darliss Ridgel, MD        Lab Results:  Results for orders placed or performed during the hospital encounter of 09/07/15 (from the past 48  hour(s))  Hemoglobin A1c     Status: None   Collection Time: 09/07/15  2:29 PM  Result Value Ref Range   Hgb A1c MFr Bld 5.5 4.0 - 6.0 %  Lipid panel, fasting     Status: Abnormal   Collection Time: 09/07/15  2:29 PM  Result Value Ref Range   Cholesterol 168 0 - 200 mg/dL   Triglycerides 161 <096 mg/dL   HDL 45 >04 mg/dL   Total CHOL/HDL Ratio 3.7 RATIO   VLDL 21 0 - 40 mg/dL   LDL Cholesterol 540 (H) 0 - 99 mg/dL    Comment:        Total Cholesterol/HDL:CHD Risk Coronary Heart Disease Risk Table                     Men   Women  1/2 Average Risk   3.4   3.3  Average Risk       5.0   4.4  2 X Average Risk   9.6   7.1  3 X Average Risk  23.4   11.0        Use the calculated Patient Ratio above and the CHD Risk Table to determine the patient's CHD Risk.        ATP III CLASSIFICATION (LDL):  <100     mg/dL   Optimal  981-191  mg/dL   Near or Above                    Optimal  130-159  mg/dL   Borderline  478-295  mg/dL   High  >621     mg/dL   Very High   TSH     Status: None   Collection Time: 09/07/15  2:29 PM  Result Value Ref Range   TSH 1.268 0.350 - 4.500 uIU/mL  Prolactin     Status: None   Collection Time: 09/07/15  2:29 PM  Result Value Ref Range   Prolactin 14.9 4.8 - 23.3 ng/mL    Comment: (NOTE) Performed At: [facility] [address] [place], [place] [id number] Mila Homer MD NG:[id number]     Physical Findings: AIMS: Facial and Oral Movements Muscles of Facial Expression: None, normal Lips and Perioral Area: None, normal Jaw: None, normal Tongue: None, normal,Extremity Movements Upper (arms, wrists, hands, fingers): None, normal Lower (legs, knees, ankles, toes): None, normal, Trunk Movements Neck, shoulders, hips: None, normal, Overall Severity Severity of abnormal movements (highest score from questions above): None, normal Incapacitation due to abnormal movements: None, normal Patient's awareness of abnormal movements (rate only  patient's report): No Awareness, Dental Status Current problems with teeth and/or dentures?:  No Does patient usually wear dentures?: No  CIWA:    COWS:     Musculoskeletal: Strength & Muscle Tone: within normal limits Gait & Station: normal Patient leans: N/A  Psychiatric Specialty Exam: Review of Systems  Constitutional: Negative.  Negative for fever, chills, weight loss, malaise/fatigue and diaphoresis.  HENT: Negative for ear discharge, ear pain, hearing loss, nosebleeds and tinnitus.        Neck pain over night from sleeping on bed.  Eyes: Negative.  Negative for blurred vision, double vision, photophobia, pain and discharge.  Respiratory: Negative.  Negative for cough, hemoptysis, sputum production and shortness of breath.   Cardiovascular: Negative.  Negative for chest pain, palpitations, orthopnea, claudication and leg swelling.  Gastrointestinal: Negative.  Negative for heartburn, nausea, vomiting, abdominal pain, diarrhea and constipation.  Genitourinary: Negative.  Negative for dysuria, urgency and frequency.  Musculoskeletal: Negative.  Negative for myalgias, back pain, joint pain and neck pain.  Skin: Negative.  Negative for itching and rash.  Neurological: Negative.  Negative for dizziness, tingling, tremors, sensory change, speech change, focal weakness and headaches.  Endo/Heme/Allergies: Negative.  Negative for environmental allergies. Does not bruise/bleed easily.    Blood pressure 100/76, pulse 87, temperature 98.4 F (36.9 C), temperature source Oral, resp. rate 20, height 5' (1.524 m), weight 51.256 kg (113 lb), last menstrual period 08/23/2015, SpO2 98 %.Body mass index is 22.07 kg/(m^2).  General Appearance: Fairly Groomed  Patent attorney::  Good  Speech:  Clear and Coherent and Normal Rate  Volume:  Normal  Mood:  "OK"  Affect:  Improved and not as depressed  Thought Process:  Goal Directed, Linear and Logical  Orientation:  Full (Time, Place, and Person)   Thought Content:  Depressive  Suicidal Thoughts:  No  Homicidal Thoughts:  No  Memory:  Immediate;   Good Recent;   Good Remote;   Good  Judgement:  Good  Insight:  Good  Psychomotor Activity:  Normal  Concentration:  Good  Recall:  Good  Fund of Knowledge:Good  Language: Good  Akathisia:  No  Handed:  Right  AIMS (if indicated):     Assets:  Communication Skills Desire for Improvement Housing Physical Health Social Support Transportation  ADL's:  Intact  Cognition: WNL  Sleep:  Number of Hours: 5.5   Treatment Plan Summary:   Treatment Plan Summary: Daily contact with patient to assess and evaluate symptoms and progress in treatment and Medication management   Frances Sanders saw is a 35 year old female with a history of bipolar depression admitted after suicide attempt by Restoril overdose.  1. Suicidal ideation. The patient denies feeling suicidal any longer. She is able to contract for safety in the hospital.   2. Mood. She has been maintained on Rexulti and Celexa in the community but believes that the Lexapro works best for her. She was started on Lexapro 16m gpo daily in the ER. Changed Rexulti to morning as nighttime dose may be contributing to insomnia. Increased Rexulti to  po daily. Printed out coupon card for Rexulti for patient as she was using samples.  3. Insomnia. She was started on trazodone  po nightly for insomnia.  4. Anxiety. She has Ativan prescribed in the community will continue.  5. Smoking. Nicotine patch is available.   6. Disposition. She will be discharged to home with family. She will follow up with Dr. Percell Belt  Daily contact with patient to assess and evaluate symptoms and progress in treatment and Medication management  Levora Angel, MD  09/09/2015, 9:42 AM

## 2015-09-09 NOTE — Plan of Care (Signed)
Problem: Alteration in mood Goal: LTG-Patient reports reduction in suicidal thoughts (Patient reports reduction in suicidal thoughts and is able to verbalize a safety plan for whenever patient is feeling suicidal)  Outcome: Progressing Patient denies SI at this time.      

## 2015-09-09 NOTE — Plan of Care (Signed)
Problem: Ineffective individual coping Goal: STG: Pt will be able to identify effective and ineffective STG: Pt will be able to identify effective and ineffective coping patterns  Outcome: Progressing Pt. attended groups. Medication education done, pt. Verbalized understanding.

## 2015-09-10 MED ORDER — ESCITALOPRAM OXALATE 20 MG PO TABS: 20.0000 mg | ORAL_TABLET | Freq: Every day | ORAL | Status: DC

## 2015-09-10 MED ORDER — BREXPIPRAZOLE 1 MG PO TABS: 2.0000 mg | ORAL_TABLET | Freq: Every morning | ORAL | Status: DC

## 2015-09-10 MED ORDER — TRAZODONE HCL 150 MG PO TABS: 150.0000 mg | ORAL_TABLET | Freq: Every day | ORAL | Status: DC

## 2015-09-10 NOTE — [facility] Suicide Risk Assessment (Signed)
[facility] INPATIENT:  Family/Significant Other Suicide Prevention Education  Suicide Prevention Education:  Education Completed; completed with pt's husband Natacha Jepsen at ph: [phone],  (name of family member/significant other) has been identified by the patient as the family member/significant other with whom the patient will be residing, and identified as the person(s) who will aid the patient in the event of a mental health crisis (suicidal ideations/suicide attempt).  With written consent from the patient, the family member/significant other has been provided the following suicide prevention education, prior to the and/or following the discharge of the patient.  CSW completed SPE with the pt.  The suicide prevention education provided includes the following:  Suicide risk factors  Suicide prevention and interventions  National Suicide Hotline telephone number  [place] assessment telephone number  [facility] Emergency Assistance 911  [facility] and/or Residential Mobile Crisis Unit telephone number  Request made of family/significant other to:  Remove weapons (e.g., guns, rifles, knives), all items previously/currently identified as safety concern.    Remove drugs/medications (over-the-counter, prescriptions, illicit drugs), all items previously/currently identified as a safety concern.  The family member/significant other verbalizes understanding of the suicide prevention education information provided.  The family member/significant other agrees to remove the items of safety concern listed above.  Dorothe Pea Shatori Bertucci 09/10/2015, 11:11 AM

## 2015-09-10 NOTE — Progress Notes (Signed)
  [facility] Adult Case Management Discharge Plan :  Will you be returning to the same living situation after discharge:  Yes,  pt will be returning home to live with her husband in [place] At discharge, do you have transportation home?: Yes,  pt will be picked up by her mother Do you have the ability to pay for your medications: Yes,  pt will be provided with prescriptions at discharge  Release of information consent forms completed and in the chart;  Patient's signature needed at discharge.  Patient to Follow up at: Follow-up Information    Follow up with [facility] On 09/14/2015.   Why:  Please arrive for your medication managment appointment with Dr. Murlean Hark on Friday, Feb. 10th at 2:20pm.    Contact information:   [address] [place], [place] [address] Phone: [phone] Fax: [phone]      Follow up with [facility] On 09/11/2015.   Why:  Please arrive for your appointment with your therapist Siaara on Tuesday, Feb 7th at 11:30 am.    Contact information:   [address], [address] [place], [place] [address] Phone: [phone] Fax: [phone]      Next level of care provider has access to [facility] Link:no  Safety Planning and Suicide Prevention discussed: Yes,  completed with pt  Have you used any form of tobacco in the last 30 days? (Cigarettes, Smokeless Tobacco, Cigars, and/or Pipes): Yes  Has patient been referred to the Quitline?: Patient refused referral  Patient has been referred for addiction treatment: Yes  Frances Sanders 09/10/2015, 11:08 AM

## 2015-09-10 NOTE — Progress Notes (Signed)
Recreation Therapy Notes  INPATIENT RECREATION TR PLAN  Patient Details Name: Frances Sanders MRN: [id number] DOB: [birth year] Today's Date: 09/10/2015  Rec Therapy Plan Is patient appropriate for Therapeutic Recreation?: Yes Treatment times per week: At least once a week TR Treatment/Interventions: 1:1 session, Group participation (Comment) (Appropriate participation in daily recreation therapy tx)  Discharge Criteria Pt will be discharged from therapy if:: Treatment goals are met, Discharged Treatment plan/goals/alternatives discussed and agreed upon by:: Patient/family  Discharge Summary Short term goals set: See Care Plan Short term goals met: Complete Progress toward goals comments: One-to-one attended Which groups?: Social skills One-to-one attended: Self-esteem, stress management Reason goals not met: N/A Therapeutic equipment acquired: None Reason patient discharged from therapy: Discharge from hospital Pt/family agrees with progress & goals achieved: Yes Date patient discharged from therapy: 09/10/15   Leonette Monarch, LRT/CTRS 09/10/2015, 1:59 PM

## 2015-09-10 NOTE — Tx Team (Signed)
Interdisciplinary Treatment Plan Update (Adult)         Date: 09/10/2015   Time Reviewed: 9:30 AM   Progress in Treatment: Improving Attending groups: Intermittently  Participating in groups:Intermittently Taking medication as prescribed: Yes  Tolerating medication: Yes  Family/Significant other contact made: Yes, CSW has spoken with the pt's husband Patient understands diagnosis: Yes  Discussing patient identified problems/goals with staff: Yes  Medical problems stabilized or resolved: Yes  Denies suicidal/homicidal ideation: Yes  Issues/concerns per patient self-inventory: Yes  Other:   New problem(s) identified: N/A   Discharge Plan or Barriers: Pt plans to return to [place] to live with her husband and follow up with therapy and medication management with [facility] upon discharge   Reason for Continuation of Hospitalization:   Depression   Anxiety   Medication Stabilization   Comments: N/A   Estimated length of stay: Date of discharge: 09/10/15    Patient is a 35 year old female admitted for overdose with benzodiazepenes.  Pt has a history of depression, anxiety, psychosis, and mood instability.  Patient has a long history of depression and mood lability. She has been a patient of Dr. Vena Rua at [facility]. She has been maintained on a combination of Rexulti and Celexa for the past 6 months. She has been on short-term disability from her job at [employer]. She has not been able to return to work and now applied for [employer] disability. She has learned that once her short-term disability expired she lost her job. Even though she does not feel that she couldn't return to work she became rather upset about it as she is losing her benefits. Last week she was admitted to [facility] after suicide by overdose. She overdosed on Restoril. This is a medication prescribed a while ago. She initially denied that she was trying to kill herself rather she was trying to go to sleep. She  took 4 tablets and then 4 tablets again of 15 mg Restoril. Reportedly she could not go to sleep. Later during the interview she says that lately she has periods of time when she feels actively hopeless and believes that if she could sleep for a long time that when she wakes up everything will be fine. Other than job loss she reports no stressors. Her family is very supportive. They do not have financial difficulties. She is healthy. She was diagnosed with bipolar disorder and had one psychotic episode. She reports that at times she gets paranoid. She reports symptoms of anxiety for which she is prescribed Ativan. In addition to generalized anxiety symptoms she has symptoms suggestive of OCD with excessive worries. At present she does not have any compulsions but in the past she used to clean excessively. She reports poor sleep, decreased appetite, anhedonia, feeling of guilt and hopelessness worthlessness, poor energy and concentration, social isolation, crying else and suicidal thinking. She denies alcohol or illicit substance use. She denies misusing of her medications.  Past psychiatric history. She was hospitalized twice once here for a psychotic break and recently at [facility] after suicide attempt. She follows up with Dr. Vena Rua.  Family psychiatric history. Depression and anxiety. Everybody is on benzodiazepine.  Social history. She just lost her job at [employer]. She lives with her husband and 42 year old son. Her parents are very supportive.  Patient lives in [place].  Patient will benefit from crisis stabilization, medication evaluation, group therapy, and psycho education in addition to case management for discharge planning. Patient and CSW reviewed pt's identified goals  and treatment plan. Pt verbalized understanding and agreed to treatment plan.      Review of initial/current patient goals per problem list:  1. Goal(s): Patient will participate in aftercare plan   Met: Yes  Target date: 3-5 days  post admission date   As evidenced by: Patient will participate within aftercare plan AEB aftercare provider and housing plan at discharge being identified.   2/6: Pt will return home to [facility] to live with her husband and follow up with [facility] for medication management and therapy   2. Goal (s): Patient will exhibit decreased depressive symptoms and suicidal ideations.   Met: Adequate for discharge per MD.  Target date: 3-5 days post admission date   As evidenced by: Patient will utilize self-rating of depression at 3 or below and demonstrate decreased signs of depression or be deemed stable for discharge by MD.  2/6: Adequate for discharge per MD. Pt denies SI Pt reports she is safe for discharge.     3. Goal(s): Patient will demonstrate decreased signs and symptoms of anxiety.   Met: Adequate for discharge per MD.  Target date: 3-5 days post admission date   As evidenced by: Patient will utilize self-rating of anxiety at 3 or below and demonstrated decreased signs of anxiety, or be deemed stable for discharge by MD   2/6: Adequate for discharge per MD.    4. Goal(s): Patient will demonstrate decreased signs of withdrawal due to substance abuse   Met: Yes  Target date: 3-5 days post admission date   As evidenced by: Patient will produce a CIWA/COWS score of 0, have stable vitals signs, and no symptoms of withdrawal   2/6: Patient produced a CIWA/COWS score of 0, has stable vitals signs, and no symptoms of withdrawal      Attendees: Physician: Orson Slick, MD 2/6/20179:53 AM  Nursing: Elige Radon, RN 2/6/20179:53 AM  Other: Alphonse Guild. Demaurion Dicioccio, LCSWA 2/6/20179:53 AM  Nursing: Cordelia Pen, RN  2/6/20179:53 AM  Nursing: Mayra Neer, RN  2/6/20179:53 AM  Other:  2/6/20179:53 AM  Other:  2/6/20179:53 AM  Other:  2/6/20179:53 AM  Other:  2/6/20179:53 AM  Other:  2/6/20179:53 AM  Other:  2/6/20179:53 AM   Other:  2/6/20179:53 AM

## 2015-09-10 NOTE — Plan of Care (Signed)
Problem: [facility] Participation in Recreation Therapeutic Interventions Goal: STG-Patient will demonstrate improved self esteem by identif STG: Self-Esteem - Within 3 treatment sessions, patient will verbalize at least 5 positive affirmation statements in one treatment session to increase self-esteem post d/c.  Outcome: Completed/Met Date Met:  09/10/15 Treatment Session 1; Completed 1 out of 1: At approximately 11:15 am, LRT met with patient in craft room. Patient verbalized 5 positive affirmation statements. Patient reported it felt "good". LRT encouraged patient to continue saying positive affirmation statements. Intervention Used: I Am statements  Leonette Monarch, LRT/CTRS 02.06.17 1:57 pm Goal: STG-Other Recreation Therapy Goal (Specify) STG: Stress Management - Within 3 treatment sessions, patient will verbalize understanding of the stress management techniques in one treatment session to increase stress management skills post d/c.  Outcome: Completed/Met Date Met:  09/10/15 Treatment Session 1; Completed 1 out of 1: At approximately 11:15 am, LRT met with patient in craft room. LRT educated and provided patient with handouts on stress management techniques. Patient verbalized understanding. LRT encouraged patient to read over and practice the stress management techniques. Intervention Used: Stress Management handouts  Leonette Monarch, LRT/CTRS 02.06.17 1:58 pm

## 2015-09-10 NOTE — Progress Notes (Signed)
Discharge note:  Patient discharged home per MD order.  Reviewed discharge instructions with patient, along with medication administration.  She received all her prescriptions and indicated understanding of follow up appointments.  She denies SI/HI/AVH.  Patient received all personal belongings out of her room; patient did not have a locker assigned. Patient left ambulatory with her mother.

## 2015-09-10 NOTE — Plan of Care (Signed)
Problem: Ineffective individual coping Goal: LTG: Patient will report a decrease in negative feelings Outcome: Progressing Patient reports decreased anxiety and denies suicidal thoughts

## 2015-09-10 NOTE — Discharge Summary (Addendum)
Physician Discharge Summary Note  Patient:  Frances Sanders is an 35 y.o., female MRN:  [id number] DOB:  [birth year] Patient phone:  [phone] (home)  Patient address:   [address] [place] [address],  Total Time spent with patient: 30 minutes  Date of Admission:  09/07/2015 Date of Discharge: 09/10/2015  Reason for Admission:  suicice attempt.  Identifying data. Frances Sanders is a 35 year old female with a history of depression, anxiety, psychosis, and mood instability.  Chief complaint. "I wasn't trying to kill myself."  History of present illness. Information was obtained from the patient and the chart. The patient has a long history of depression and mood lability. She has been a patient of Dr. Percell Belt at [facility]. She has been maintained on a combination of Rexulti and Celexa for the past 6 months. She has been on short-term disability from her job at [employer]. She has not been able to return to work and now applied for [employer] disability. She has learned that once her short-term disability expired she lost her job. Even though she does not feel that she couldn't return to work she became rather upset about it as she is losing her benefits. Last week she was admitted to [facility] after suicide by overdose. She overdosed on Restoril. This is a medication prescribed a while ago. She initially denied that she was trying to kill herself rather she was trying to go to sleep. She took 4 tablets and then 4 tablets again of 15 mg Restoril. Reportedly she could not go to sleep. Later during the interview she says that lately she has periods of time when she feels actively hopeless and believes that if she could sleep for a long time that when she wakes up everything will be fine. Other than job loss she reports no stressors. Her family is very supportive. They do not have financial difficulties. She is healthy. She was diagnosed with bipolar disorder and had one psychotic episode. She reports  that at times she gets paranoid. She reports symptoms of anxiety for which she is prescribed Ativan. In addition to generalized anxiety symptoms she has symptoms suggestive of OCD with excessive worries. At present she does not have any compulsions but in the past she used to clean excessively. She reports poor sleep, decreased appetite, anhedonia, feeling of guilt and hopelessness worthlessness, poor energy and concentration, social isolation, crying else and suicidal thinking. She denies alcohol or illicit substance use. She denies misusing of her medications.  Past psychiatric history. She was hospitalized twice once here for a psychotic break and recently at [facility] after suicide attempt. She follows up with Dr. Percell Belt.  Family psychiatric history. Depression and anxiety. Everybody is on benzodiazepine.  Social history. She just lost her job at [employer]. She lives with her husband and 38 year old son. Her parents are very supportive.  Principal Problem: Severe recurrent major depression without psychotic features [facility]) Discharge Diagnoses: Patient Active Problem List   Diagnosis Date Noted  . Suicidal behavior [F48.9] 09/07/2015  . Severe recurrent major depression without psychotic features (HCC) [F33.2] 09/07/2015  . Benzodiazepine overdose [T42.4X1A] 09/07/2015  . Tobacco use disorder [F17.200] 09/07/2015   Past Psychiatric History: depression.  Past Medical History:  Past Medical History  Diagnosis Date  . Bipolar 1 disorder (HCC)   . Depression   . Anxiety   . Insomnia     Past Surgical History  Procedure Laterality Date  . Appendectomy    . Hernia repair  Family History: History reviewed. No pertinent family history. Family Psychiatric  History: depression and anxiety. Social History:  History  Alcohol Use No     History  Drug Use No    Social History   Social History  . Marital Status: Married    Spouse Name: N/A  . Number of Children: N/A  . Years of  Education: N/A   Social History Main Topics  . Smoking status: Current Every Day Smoker -- 1.00 packs/day  . Smokeless tobacco: None  . Alcohol Use: No  . Drug Use: No  . Sexual Activity: Not Asked   Other Topics Concern  . None   Social History Narrative    Hospital Course:    Frances Sanders is a 35 year old female with a history of depression admitted after suicide attempt by Restoril overdose.  1. Suicidal ideation. The patient denies feeling suicidal any longer. She is able to contract for safety.   2. Mood. She has been maintained on Rexulti and Celexa in the community but believes that the Lexapro works best for her. She was started on Lexapro. We continued Rexulti at a higher dose of 2 mg. We printed out coupon card for Rexulti as she was using samples.  3. Insomnia. Restoril was discontinued. She was started on trazodone.  4. Anxiety. We continued Ativan as prescribed in the community.  5. Smoking. Nicotine patch was available.   6. Metabolic syndrome. Lipid panal, HgbA1C, TSH and PRL are normal.  7. Disposition. She was discharged to home with family. She will follow up with Dr. Percell Belt  Physical Findings: AIMS: Facial and Oral Movements Muscles of Facial Expression: None, normal Lips and Perioral Area: None, normal Jaw: None, normal Tongue: None, normal,Extremity Movements Upper (arms, wrists, hands, fingers): None, normal Lower (legs, knees, ankles, toes): None, normal, Trunk Movements Neck, shoulders, hips: None, normal, Overall Severity Severity of abnormal movements (highest score from questions above): None, normal Incapacitation due to abnormal movements: None, normal Patient's awareness of abnormal movements (rate only patient's report): No Awareness, Dental Status Current problems with teeth and/or dentures?: No Does patient usually wear dentures?: No  CIWA:    COWS:     Musculoskeletal: Strength & Muscle Tone: within normal limits Gait & Station:  normal Patient leans: N/A  Psychiatric Specialty Exam: Review of Systems  All other systems reviewed and are negative.   Blood pressure 100/76, pulse 87, temperature 98.4 F (36.9 C), temperature source Oral, resp. rate 20, height 5' (1.524 m), weight 51.256 kg (113 lb), last menstrual period 08/23/2015, SpO2 98 %.Body mass index is 22.07 kg/(m^2).  See SRA.                                                  Sleep:  Number of Hours: 5.5   Have you used any form of tobacco in the last 30 days? (Cigarettes, Smokeless Tobacco, Cigars, and/or Pipes): Yes  Has this patient used any form of tobacco in the last 30 days? (Cigarettes, Smokeless Tobacco, Cigars, and/or Pipes) Yes, Yes, A prescription for an FDA-approved tobacco cessation medication was offered at discharge and the patient refused  Metabolic Disorder Labs:  Lab Results  Component Value Date   HGBA1C 5.5 09/07/2015   Lab Results  Component Value Date   PROLACTIN 14.9 09/07/2015   Lab Results  Component Value Date  CHOL 168 09/07/2015   TRIG 103 09/07/2015   HDL 45 09/07/2015   CHOLHDL 3.7 09/07/2015   VLDL 21 09/07/2015   LDLCALC 102* 09/07/2015    See Psychiatric Specialty Exam and Suicide Risk Assessment completed by Attending Physician prior to discharge.  Discharge destination:  Home  Is patient on multiple antipsychotic therapies at discharge:  No   Has Patient had three or more failed trials of antipsychotic monotherapy by history:  No  Recommended Plan for Multiple Antipsychotic Therapies: NA  Discharge Instructions    Diet - low sodium heart healthy    Complete by:  As directed      Increase activity slowly    Complete by:  As directed             Medication List    STOP taking these medications        temazepam 15 MG capsule  Commonly known as:  RESTORIL      TAKE these medications      Indication   Brexpiprazole 1 MG Tabs  Commonly known as:  REXULTI  Take 2 mg by  mouth every morning.   Indication:  Major Depressive Disorder     escitalopram 20 MG tablet  Commonly known as:  LEXAPRO  Take 1 tablet (20 mg total) by mouth daily.   Indication:  Major Depressive Disorder     LORazepam 1 MG tablet  Commonly known as:  ATIVAN  Take 1 mg by mouth daily.      traZODone 150 MG tablet  Commonly known as:  DESYREL  Take 1 tablet (150 mg total) by mouth at bedtime.   Indication:  Trouble Sleeping         Follow-up recommendations:  Activity:  as tolerated. Diet:  low sodium heart healthy. Other:  keep follow up appointments.  Comments:    Signed: Kristine Linea, MD 09/10/2015, 5:45 AM

## 2015-09-10 NOTE — Plan of Care (Signed)
Problem: Alteration in mood Goal: LTG-Pt's behavior demonstrates decreased signs of depression (Patient's behavior demonstrates decreased signs of depression to the point the patient is safe to return home and continue treatment in an outpatient setting)  Outcome: Progressing Patient is involved in group activities. Often visible in the milieu interacting appropriately and supportive of others.

## 2015-09-10 NOTE — BHH Suicide Risk Assessment (Signed)
[facility] Discharge Suicide Risk Assessment   Principal Problem: Severe recurrent major depression without psychotic features [facility]) Discharge Diagnoses:  Patient Active Problem List   Diagnosis Date Noted  . Suicidal behavior [F48.9] 09/07/2015  . Severe recurrent major depression without psychotic features (HCC) [F33.2] 09/07/2015  . Benzodiazepine overdose [T42.4X1A] 09/07/2015  . Tobacco use disorder [F17.200] 09/07/2015    Total Time spent with patient: 30 minutes  Musculoskeletal: Strength & Muscle Tone: within normal limits Gait & Station: normal Patient leans: N/A  Psychiatric Specialty Exam: Review of Systems  All other systems reviewed and are negative.   Blood pressure 100/76, pulse 87, temperature 98.4 F (36.9 C), temperature source Oral, resp. rate 20, height 5' (1.524 m), weight 51.256 kg (113 lb), last menstrual period 08/23/2015, SpO2 98 %.Body mass index is 22.07 kg/(m^2).  General Appearance: Casual  Eye Contact::  Good  Speech:  Clear and Coherent409  Volume:  Normal  Mood:  Euthymic  Affect:  Appropriate  Thought Process:  Goal Directed  Orientation:  Full (Time, Place, and Person)  Thought Content:  WDL  Suicidal Thoughts:  No  Homicidal Thoughts:  No  Memory:  Immediate;   Fair Recent;   Fair Remote;   Fair  Judgement:  Fair  Insight:  Fair  Psychomotor Activity:  Normal  Concentration:  Fair  Recall:  [employer] of Knowledge:Fair  Language: Fair  Akathisia:  No  Handed:  Left  AIMS (if indicated):     Assets:  Communication Skills Desire for Improvement Financial Resources/Insurance Housing Intimacy Physical Health Resilience Social Support  Sleep:  Number of Hours: 5.5  Cognition: WNL  ADL's:  Intact   Mental Status Per Nursing Assessment::   On Admission:  Suicidal ideation indicated by patient  Demographic Factors:  Caucasian and Unemployed  Loss Factors: Decrease in vocational status  Historical Factors: Prior suicide  attempts and Impulsivity  Risk Reduction Factors:   Responsible for children under 65 years of age, Sense of responsibility to family, Living with another person, especially a relative, Positive social support and Positive therapeutic relationship  Continued Clinical Symptoms:  Depression:   Impulsivity  Cognitive Features That Contribute To Risk:  None    Suicide Risk:  Minimal: No identifiable suicidal ideation.  Patients presenting with no risk factors but with morbid ruminations; may be classified as minimal risk based on the severity of the depressive symptoms    Plan Of Care/Follow-up recommendations:  Activity:  as tolerated. Diet:  low sodium heart healthy. Other:  keep follow up appointments.  Kristine Linea, MD 09/10/2015, 5:39 AM

## 2015-09-10 NOTE — Progress Notes (Signed)
2300: patient stayed in the dayroom watching TV with peers. Pleasant and cooperative. Became anxious later, secondary to back pain. Requested Ibuprofen as prescribed. Patient had medications and snack. Emotional support and encouragements offered. Safety precautions maintained.

## 2015-10-11 IMAGING — CR DG CHEST 1V PORT
1 series · 1 of 1 positions shown · non-contrast
Comparison: Prior study from 09/16/2010

CLINICAL DATA: Palpitations

EXAM:
PORTABLE CHEST - 1 VIEW

[ap]
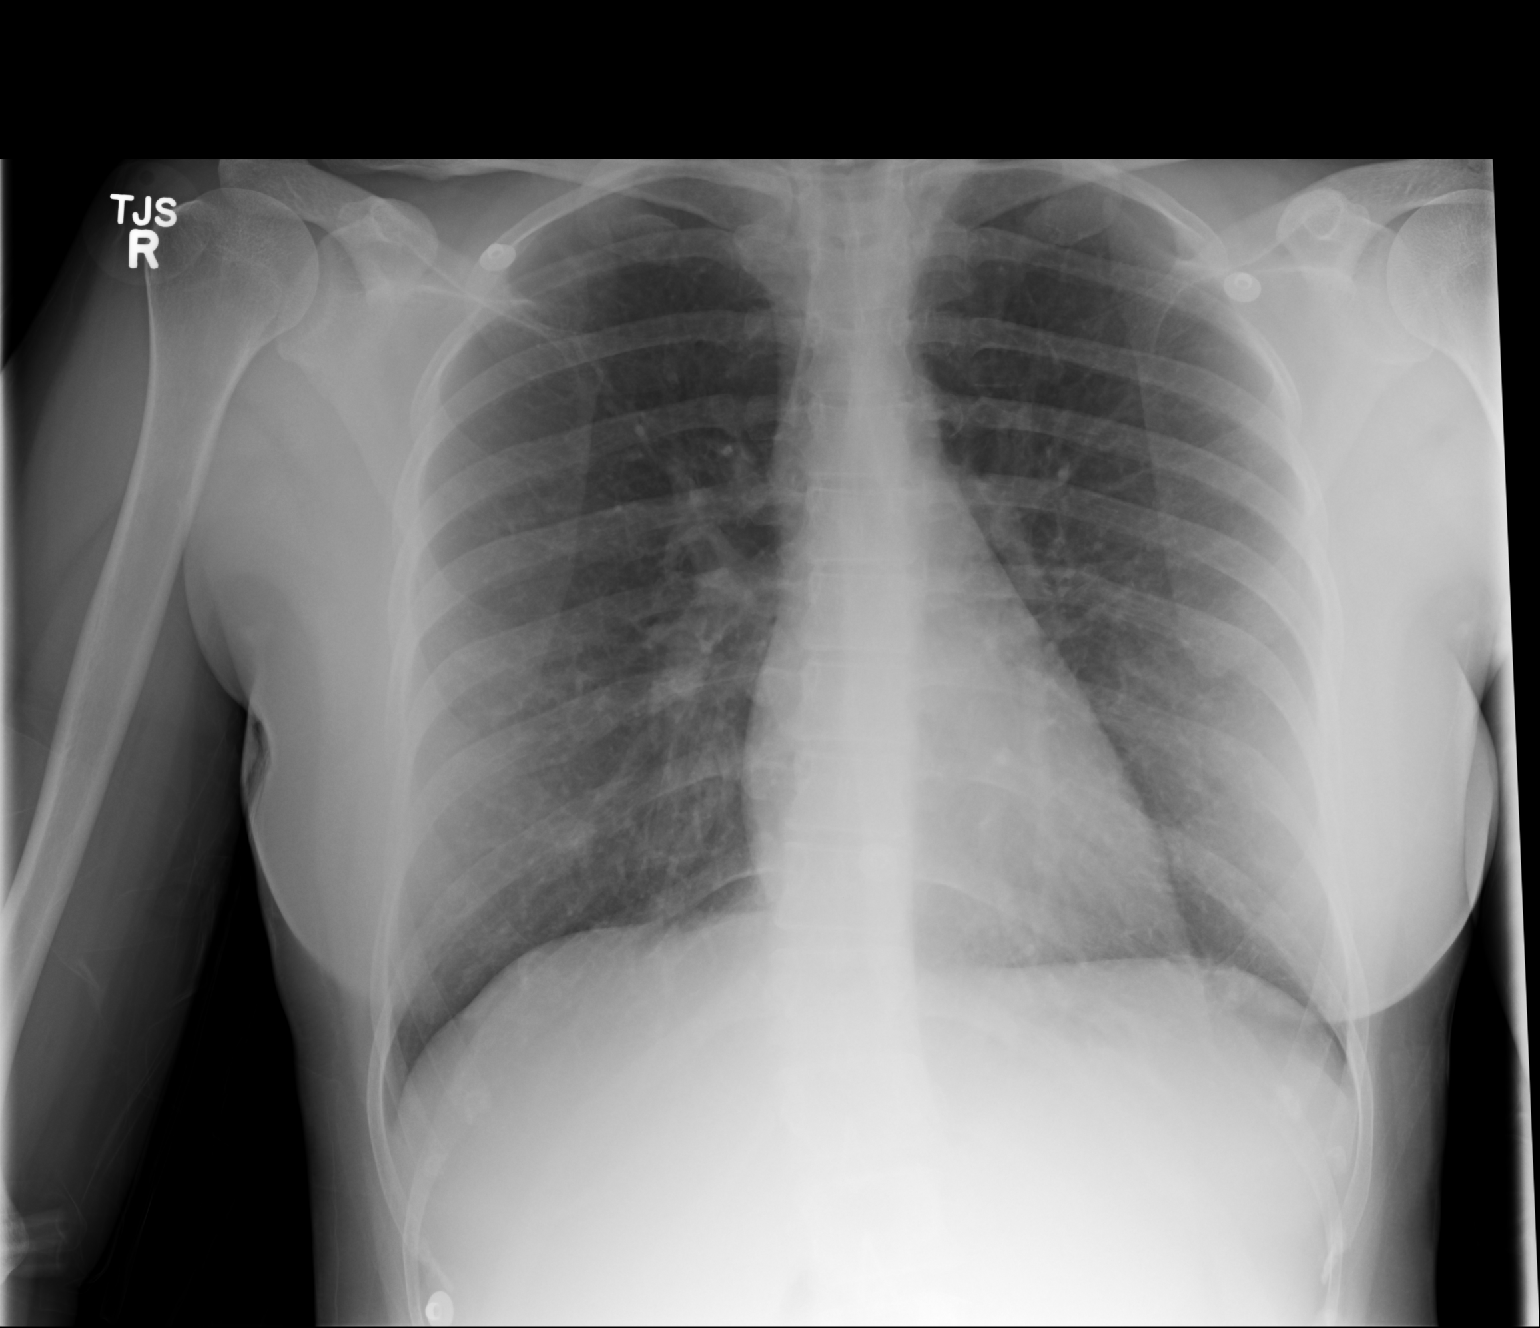

[1 of 1 positions shown; findings below may reference images not displayed]

FINDINGS: The cardiac and mediastinal silhouettes are stable in size and
contour, and remain within normal limits.

The lungs are normally inflated. No airspace consolidation, pleural
effusion, or pulmonary edema is identified. There is no
pneumothorax.

No acute osseous abnormality identified.
IMPRESSION: No acute cardiopulmonary abnormality.

## 2016-01-21 ENCOUNTER — Emergency Department
Admission: EM | Admit: 2016-01-21 | Discharge: 2016-01-21 | Disposition: A | Discharge status: Home / Self Care | Payer: BLUE CROSS/BLUE SHIELD | Attending: Emergency Medicine | Admitting: Emergency Medicine

## 2016-01-21 ENCOUNTER — Encounter: Payer: Self-pay | Admitting: Emergency Medicine

## 2016-01-21 DIAGNOSIS — F17200 Nicotine dependence, unspecified, uncomplicated: Secondary | ICD-10-CM | POA: Diagnosis not present

## 2016-01-21 DIAGNOSIS — Z3A16 16 weeks gestation of pregnancy: Secondary | ICD-10-CM | POA: Diagnosis not present

## 2016-01-21 DIAGNOSIS — F319 Bipolar disorder, unspecified: Secondary | ICD-10-CM | POA: Diagnosis not present

## 2016-01-21 DIAGNOSIS — O219 Vomiting of pregnancy, unspecified: Secondary | ICD-10-CM | POA: Diagnosis present

## 2016-01-21 LAB — COMPREHENSIVE METABOLIC PANEL WITH GFR
ALT: 14 U/L (ref 14–54)
AST: 23 U/L (ref 15–41)
Albumin: 4 g/dL (ref 3.5–5.0)
Alkaline Phosphatase: 65 U/L (ref 38–126)
Anion gap: 9 (ref 5–15)
BUN: <5 mg/dL — ABNORMAL LOW (ref 6–20)
CO2: 20 mmol/L — ABNORMAL LOW (ref 22–32)
Calcium: 9.1 mg/dL (ref 8.9–10.3)
Chloride: 104 mmol/L (ref 101–111)
Creatinine, Ser: 0.43 mg/dL — ABNORMAL LOW (ref 0.44–1.00)
GFR calc Af Amer: >60 mL/min
GFR calc non Af Amer: >60 mL/min
Glucose, Bld: 118 mg/dL — ABNORMAL HIGH (ref 65–99)
Potassium: 3.2 mmol/L — ABNORMAL LOW (ref 3.5–5.1)
Sodium: 133 mmol/L — ABNORMAL LOW (ref 135–145)
Total Bilirubin: 0.4 mg/dL (ref 0.3–1.2)
Total Protein: 6.8 g/dL (ref 6.5–8.1)

## 2016-01-21 LAB — URINALYSIS COMPLETE WITH MICROSCOPIC (ARMC ONLY)
Bilirubin Urine: NEGATIVE
Glucose, UA: NEGATIVE mg/dL
Hgb urine dipstick: NEGATIVE
Leukocytes, UA: NEGATIVE
Nitrite: NEGATIVE
PH: 7 (ref 5.0–8.0)
PROTEIN: NEGATIVE mg/dL
Specific Gravity, Urine: 1.008 (ref 1.005–1.030)

## 2016-01-21 LAB — CBC
HCT: 37.4 % (ref 35.0–47.0)
Hemoglobin: 13.1 g/dL (ref 12.0–16.0)
MCH: 33.2 pg (ref 26.0–34.0)
MCHC: 34.9 g/dL (ref 32.0–36.0)
MCV: 95.1 fL (ref 80.0–100.0)
Platelets: 277 K/uL (ref 150–440)
RBC: 3.94 MIL/uL (ref 3.80–5.20)
RDW: 12.5 % (ref 11.5–14.5)
WBC: 12.5 K/uL — ABNORMAL HIGH (ref 3.6–11.0)

## 2016-01-21 LAB — LIPASE, BLOOD: LIPASE: 23 U/L (ref 11–51)

## 2016-01-21 MED ORDER — ONDANSETRON HCL 4 MG/2ML IJ SOLN
INTRAMUSCULAR | Status: AC
Start: 2016-01-21 — End: 2016-01-21
  Administered 2016-01-21: 4 mg via INTRAVENOUS
  Filled 2016-01-21: qty 2

## 2016-01-21 MED ORDER — ONDANSETRON HCL 4 MG PO TABS: 4.0000 mg | ORAL_TABLET | Freq: Every day | ORAL | Status: DC | PRN

## 2016-01-21 MED ORDER — ONDANSETRON HCL 4 MG/2ML IJ SOLN
4.0000 mg | Freq: Once | INTRAMUSCULAR | Status: AC
  Administered 2016-01-21: 4 mg via INTRAVENOUS

## 2016-01-21 MED ORDER — PROMETHAZINE HCL 25 MG PO TABS: 25.0000 mg | ORAL_TABLET | Freq: Four times a day (QID) | ORAL | Status: DC | PRN

## 2016-01-21 MED ORDER — DEXTROSE-NACL 5-0.9 % IV SOLN
1000.0000 mL | Freq: Once | INTRAVENOUS | Status: AC
  Administered 2016-01-21: 1000 mL via INTRAVENOUS

## 2016-01-21 NOTE — ED Provider Notes (Signed)
[facility] Emergency Department Provider Note        Time seen: ----------------------------------------- 4:01 PM on 01/21/2016 -----------------------------------------    I have reviewed the triage vital signs and the nursing notes.   HISTORY  Chief Complaint Emesis During Pregnancy    HPI Frances Sanders is a 35 y.o. female who presents the ER for persistent vomiting in pregnancy. Patient states she is [redacted] weeks pregnant and recently ran out of.clear just. Patient states she was taking that at night and it was helping her nausea. She is G2 P1 Ab0. Did not have this problem with the first pregnancy. She denies any abdominal pain, denies vaginal bleeding.   Past Medical History  Diagnosis Date  . Bipolar 1 disorder (HCC)   . Depression   . Anxiety   . Insomnia     Patient Active Problem List   Diagnosis Date Noted  . Suicidal behavior 09/07/2015  . Severe recurrent major depression without psychotic features (HCC) 09/07/2015  . Benzodiazepine overdose 09/07/2015  . Tobacco use disorder 09/07/2015    Past Surgical History  Procedure Laterality Date  . Appendectomy    . Hernia repair      Allergies Review of patient's allergies indicates no known allergies.  Social History Social History  Substance Use Topics  . Smoking status: Current Every Day Smoker -- 1.00 packs/day  . Smokeless tobacco: None  . Alcohol Use: No    Review of Systems Constitutional: Negative for fever. Eyes: Negative for visual changes. ENT: Negative for sore throat. Cardiovascular: Negative for chest pain. Respiratory: Negative for shortness of breath. Gastrointestinal: Negative for abdominal pain, Positive for vomiting Genitourinary: Negative for dysuria. Musculoskeletal: Negative for back pain. Skin: Negative for rash. Neurological: Negative for headaches, focal weakness or numbness.  10-point ROS otherwise  negative.  ____________________________________________   PHYSICAL EXAM:  VITAL SIGNS: ED Triage Vitals  Enc Vitals Group     BP 01/21/16 1430 104/64 mmHg     Pulse Rate 01/21/16 1430 86     Resp 01/21/16 1430 16     Temp 01/21/16 1430 98.2 F (36.8 C)     Temp Source 01/21/16 1430 Oral     SpO2 01/21/16 1430 97 %     Weight 01/21/16 1430 118 lb (53.524 kg)     Height 01/21/16 1430 5' (1.524 m)     Head Cir --      Peak Flow --      Pain Score 01/21/16 1431 0     Pain Loc --      Pain Edu? --      Excl. in GC? --    Constitutional: Alert and oriented. Well appearing and in no distress. Eyes: Conjunctivae are normal. PERRL. Normal extraocular movements. ENT   Head: Normocephalic and atraumatic.   Nose: No congestion/rhinnorhea.   Mouth/Throat: Mucous membranes are moist.   Neck: No stridor. Cardiovascular: Normal rate, regular rhythm. No murmurs, rubs, or gallops. Respiratory: Normal respiratory effort without tachypnea nor retractions. Breath sounds are clear and equal bilaterally. No wheezes/rales/rhonchi. Gastrointestinal: Soft and nontender. Normal bowel sounds Musculoskeletal: Nontender with normal range of motion in all extremities. No lower extremity tenderness nor edema. Neurologic:  Normal speech and language. No gross focal neurologic deficits are appreciated.  Skin:  Skin is warm, dry and intact. No rash noted. Psychiatric: Mood and affect are normal. Speech and behavior are normal.  ____________________________________________  ED COURSE:  Pertinent labs & imaging results that were available during my care  of the patient were reviewed by me and considered in my medical decision making (see chart for details). Patient resents to ER with vomiting in pregnancy. She will receive D5 normal saline bolus as well as IV Zofran. ____________________________________________    LABS (pertinent positives/negatives)  Labs Reviewed  COMPREHENSIVE METABOLIC  PANEL - Abnormal; Notable for the following:    Sodium 133 (*)    Potassium 3.2 (*)    CO2 20 (*)    Glucose, Bld 118 (*)    BUN <5 (*)    Creatinine, Ser 0.43 (*)    All other components within normal limits  CBC - Abnormal; Notable for the following:    WBC 12.5 (*)    All other components within normal limits  URINALYSIS COMPLETEWITH MICROSCOPIC ([facility] ONLY) - Abnormal; Notable for the following:    Color, Urine YELLOW (*)    APPearance CLEAR (*)    Ketones, ur 1+ (*)    Bacteria, UA MANY (*)    Squamous Epithelial / LPF 0-5 (*)    All other components within normal limits  LIPASE, BLOOD  ____________________________________________  FINAL ASSESSMENT AND PLAN  Vomiting in pregnancy  Plan: Patient with labs as dictated above. Labs are reassuring. She is currently feeling better after fluids and antiemetics. I will prescribe antiemetics to take at home. She is stable for discharge.   Emily FilbertWilliams, Welborn Keena E, MD   Note: This dictation was prepared with Dragon dictation. Any transcriptional errors that result from this process are unintentional   Emily FilbertJonathan E Angeleah Labrake, MD 01/21/16 1725

## 2016-01-21 NOTE — Discharge Instructions (Signed)
Hyperemesis Gravidarum  Hyperemesis gravidarum is a severe form of nausea and vomiting that happens during pregnancy. Hyperemesis is worse than morning sickness. It may cause you to have nausea or vomiting all day for many days. It may keep you from eating and drinking enough food and liquids. Hyperemesis usually occurs during the first half (the first 20 weeks) of pregnancy. It often goes away once a woman is in her second half of pregnancy. However, sometimes hyperemesis continues through an entire pregnancy.   CAUSES   The cause of this condition is not completely known but is thought to be related to changes in the body's hormones when pregnant. It could be from the high level of the pregnancy hormone or an increase in estrogen in the body.   SIGNS AND SYMPTOMS    Severe nausea and vomiting.   Nausea that does not go away.   Vomiting that does not allow you to keep any food down.   Weight loss and body fluid loss (dehydration).   Having no desire to eat or not liking food you have previously enjoyed.  DIAGNOSIS   Your health care provider will do a physical exam and ask you about your symptoms. He or she may also order blood tests and urine tests to make sure something else is not causing the problem.   TREATMENT   You may only need medicine to control the problem. If medicines do not control the nausea and vomiting, you will be treated in the hospital to prevent dehydration, increased acid in the blood (acidosis), weight loss, and changes in the electrolytes in your body that may harm the unborn baby (fetus). You may need IV fluids.   HOME CARE INSTRUCTIONS    Only take over-the-counter or prescription medicines as directed by your health care provider.   Try eating a couple of dry crackers or toast in the morning before getting out of bed.   Avoid foods and smells that upset your stomach.   Avoid fatty and spicy foods.   Eat 5-6 small meals a day.   Do not drink when eating meals. Drink between  meals.   For snacks, eat high-protein foods, such as cheese.   Eat or suck on things that have ginger in them. Ginger helps nausea.   Avoid food preparation. The smell of food can spoil your appetite.   Avoid iron pills and iron in your multivitamins until after 3-4 months of being pregnant. However, consult with your health care provider before stopping any prescribed iron pills.  SEEK MEDICAL CARE IF:    Your abdominal pain increases.   You have a severe headache.   You have vision problems.   You are losing weight.  SEEK IMMEDIATE MEDICAL CARE IF:    You are unable to keep fluids down.   You vomit blood.   You have constant nausea and vomiting.   You have excessive weakness.   You have extreme thirst.   You have dizziness or fainting.   You have a fever or persistent symptoms for more than 2-3 days.   You have a fever and your symptoms suddenly get worse.  MAKE SURE YOU:    Understand these instructions.   Will watch your condition.   Will get help right away if you are not doing well or get worse.     This information is not intended to replace advice given to you by your health care provider. Make sure you discuss any questions you have with   your health care provider.     Document Released: 07/21/2005 Document Revised: 05/11/2013 Document Reviewed: 03/02/2013  Elsevier Interactive Patient Education 2016 Elsevier Inc[employer]

## 2016-01-21 NOTE — ED Notes (Signed)
MD at bedside. 

## 2016-01-21 NOTE — ED Notes (Signed)
Patient is [redacted] weeks pregnant.  Unable to keep anything down for 4 days.  Patient is a patient of Dr. Tiburcio PeaHarris is patient's OBGYN.  Had a RX for nausea, but ran out and cannot afford to refill RX.

## 2016-03-17 ENCOUNTER — Observation Stay
Admission: EM | Admit: 2016-03-17 | Discharge: 2016-03-17 | Disposition: A | Discharge status: Home / Self Care | Payer: BLUE CROSS/BLUE SHIELD | Attending: Obstetrics & Gynecology | Admitting: Obstetrics & Gynecology

## 2016-03-17 DIAGNOSIS — F332 Major depressive disorder, recurrent severe without psychotic features: Secondary | ICD-10-CM | POA: Insufficient documentation

## 2016-03-17 DIAGNOSIS — R109 Unspecified abdominal pain: Secondary | ICD-10-CM

## 2016-03-17 DIAGNOSIS — F319 Bipolar disorder, unspecified: Secondary | ICD-10-CM | POA: Insufficient documentation

## 2016-03-17 DIAGNOSIS — O26892 Other specified pregnancy related conditions, second trimester: Principal | ICD-10-CM | POA: Insufficient documentation

## 2016-03-17 DIAGNOSIS — O99332 Smoking (tobacco) complicating pregnancy, second trimester: Secondary | ICD-10-CM | POA: Insufficient documentation

## 2016-03-17 DIAGNOSIS — F419 Anxiety disorder, unspecified: Secondary | ICD-10-CM | POA: Insufficient documentation

## 2016-03-17 DIAGNOSIS — O99342 Other mental disorders complicating pregnancy, second trimester: Secondary | ICD-10-CM | POA: Insufficient documentation

## 2016-03-17 DIAGNOSIS — Z3A24 24 weeks gestation of pregnancy: Secondary | ICD-10-CM | POA: Insufficient documentation

## 2016-03-17 DIAGNOSIS — O26899 Other specified pregnancy related conditions, unspecified trimester: Secondary | ICD-10-CM

## 2016-03-17 LAB — URINALYSIS COMPLETE WITH MICROSCOPIC (ARMC ONLY)
Bilirubin Urine: NEGATIVE
Glucose, UA: NEGATIVE mg/dL
HGB URINE DIPSTICK: NEGATIVE
Ketones, ur: NEGATIVE mg/dL
Leukocytes, UA: NEGATIVE
Nitrite: NEGATIVE
PROTEIN: NEGATIVE mg/dL
RBC / HPF: NONE SEEN RBC/hpf (ref 0–5)
SPECIFIC GRAVITY, URINE: 1.003 — AB (ref 1.005–1.030)
pH: 9 — ABNORMAL HIGH (ref 5.0–8.0)

## 2016-03-17 MED ORDER — PRENATAL MULTIVITAMIN CH: 1.0000 | ORAL_TABLET | Freq: Every day | ORAL | Status: DC

## 2016-03-17 NOTE — Discharge Summary (Signed)
Obstetric History and Physical  Frances Sanders is a 35 y.o. G1P0 with Estimated Date of Delivery: 07/03/16 who presents at 4359w4d  presenting for low abdominal pressure. Patient states she has been having no contractions, no vaginal bleeding, intact membranes, with active fetal movement.    Prenatal Course Source of Care: [facility]   Pregnancy complications or risks: None per pt, records not available  Patient Active Problem List   Diagnosis Date Noted  . Suicidal behavior 09/07/2015  . Severe recurrent major depression without psychotic features (HCC) 09/07/2015  . Benzodiazepine overdose 09/07/2015  . Tobacco use disorder 09/07/2015     Prenatal Transfer Tool   Past Medical History:  Diagnosis Date  . Anxiety   . Bipolar 1 disorder (HCC)   . Depression   . Insomnia     Past Surgical History:  Procedure Laterality Date  . APPENDECTOMY    . HERNIA REPAIR      OB History  Gravida Para Term Preterm AB Living  1            SAB TAB Ectopic Multiple Live Births               # Outcome Date GA Lbr Len/2nd Weight Sex Delivery Anes PTL Lv  1 Current               Social History   Social History  . Marital status: Married    Spouse name: N/A  . Number of children: N/A  . Years of education: N/A   Social History Main Topics  . Smoking status: Current Every Day Smoker    Packs/day: 1.00  . Smokeless tobacco: Not on file  . Alcohol use No  . Drug use: No  . Sexual activity: Not on file   Other Topics Concern  . Not on file   Social History Narrative  . No narrative on file    No family history on file.  Medications: PNV  No Known Allergies  Review of Systems: Negative except for what is mentioned in HPI.  Physical Exam: LMP 08/23/2015  GENERAL: Well-developed, well-nourished female in no acute distress.  ABDOMEN: Soft, nontender, nondistended, gravid. EXTREMITIES: Nontender, no edema Cervical Exam: Dilatation 0 cm   Effacement 0 %   Station OOP    FHT: 140 Contractions: none per toco   Pertinent Labs/Studies:   No results found for this or any previous visit (from the past 24 hour(s)).  Assessment : IUP at 7559w4d, no evidence of preterm labor   Plan: Discharge Home after UA resulted Likely discomforts of pregnancy

## 2016-03-18 NOTE — OB Triage Note (Signed)
Patient presented to L&D complaining of lower abdominal pain since yesterday afternoon that has progressively gotten worse.  Patient denies any leaking of fluid, vaginal bleeding or decreased fetal movement.

## 2016-05-30 ENCOUNTER — Observation Stay
Admission: EM | Admit: 2016-05-30 | Discharge: 2016-05-30 | Disposition: A | Discharge status: Home / Self Care | Payer: Medicaid Other | Attending: Obstetrics and Gynecology | Admitting: Obstetrics and Gynecology

## 2016-05-30 DIAGNOSIS — Z3A35 35 weeks gestation of pregnancy: Secondary | ICD-10-CM | POA: Diagnosis not present

## 2016-05-30 DIAGNOSIS — Z79899 Other long term (current) drug therapy: Secondary | ICD-10-CM | POA: Diagnosis not present

## 2016-05-30 DIAGNOSIS — O479 False labor, unspecified: Secondary | ICD-10-CM | POA: Diagnosis present

## 2016-05-30 MED ORDER — ZOLPIDEM TARTRATE 5 MG PO TABS: 5.0000 mg | ORAL_TABLET | Freq: Every evening | ORAL | 1 refills | Status: DC | PRN

## 2016-05-30 NOTE — Final Progress Note (Signed)
Physician Final Progress Note  Patient ID: Frances RifeStephanie B Sanders MRN: [id number] DOB/AGE: 33/09/1980 33 y.o.  Admit date: 05/30/2016 Admitting provider: Vena AustriaAndreas Maclin Guerrette, MD Discharge date: 05/30/2016   Admission Diagnoses: Irregular contractions  Discharge Diagnoses:  Active Problems:   Irregular contractions   Consults: None  Significant Findings/ Diagnostic Studies: none  Procedures: NST 130, moderate, +accels, no decels. Toco irregular contractions  Discharge Condition: good  Disposition: 01-Home or Self Care  Diet: Regular diet  Discharge Activity: Activity as tolerated  Discharge Instructions    Discharge activity:  No Restrictions    Complete by:  As directed    Discharge diet:  No restrictions    Complete by:  As directed    Fetal Kick Count:  Lie on our left side for one hour after a meal, and count the number of times your baby kicks.  If it is less than 5 times, get up, move around and drink some juice.  Repeat the test 30 minutes later.  If it is still less than 5 kicks in an hour, notify your doctor.    Complete by:  As directed    LABOR:  When conractions begin, you should start to time them from the beginning of one contraction to the beginning  of the next.  When contractions are 5 - 10 minutes apart or less and have been regular for at least an hour, you should call your health care provider.    Complete by:  As directed    No sexual activity restrictions    Complete by:  As directed    Notify physician for bleeding from the vagina    Complete by:  As directed    Notify physician for blurring of vision or spots before the eyes    Complete by:  As directed    Notify physician for chills or fever    Complete by:  As directed    Notify physician for fainting spells, "black outs" or loss of consciousness    Complete by:  As directed    Notify physician for increase in vaginal discharge    Complete by:  As directed    Notify physician for leaking of  fluid    Complete by:  As directed    Notify physician for pain or burning when urinating    Complete by:  As directed    Notify physician for pelvic pressure (sudden increase)    Complete by:  As directed    Notify physician for severe or continued nausea or vomiting    Complete by:  As directed    Notify physician for sudden gushing of fluid from the vagina (with or without continued leaking)    Complete by:  As directed    Notify physician for sudden, constant, or occasional abdominal pain    Complete by:  As directed    Notify physician if baby moving less than usual    Complete by:  As directed        Medication List    TAKE these medications   Brexpiprazole 1 MG Tabs Commonly known as:  REXULTI Take 2 mg by mouth every morning.   escitalopram 20 MG tablet Commonly known as:  LEXAPRO Take 1 tablet (20 mg total) by mouth daily.   LORazepam 1 MG tablet Commonly known as:  ATIVAN Take 1 mg by mouth daily.   multivitamin-prenatal 27-0.8 MG Tabs tablet Take 1 tablet by mouth daily at 12 noon.   ondansetron 4 MG tablet Commonly  known as:  ZOFRAN Take 1 tablet (4 mg total) by mouth daily as needed for nausea or vomiting.   promethazine 25 MG tablet Commonly known as:  PHENERGAN Take 1 tablet (25 mg total) by mouth every 6 (six) hours as needed for nausea or vomiting.   traZODone 150 MG tablet Commonly known as:  DESYREL Take 1 tablet (150 mg total) by mouth at bedtime.   zolpidem 5 MG tablet Commonly known as:  AMBIEN Take 1 tablet (5 mg total) by mouth at bedtime as needed for sleep.        Total time spent taking care of this patient: 15 minutes  Signed: Lorrene Reid 05/30/2016, 2:41 PM

## 2016-05-30 NOTE — OB Triage Note (Signed)
G2P1 EDC 11/30 35.[redacted] week gestation [facility]  [facility]'s since [facility]. They were time during the night and are now Q 5 mins apart per pt. States active fetal movement noted. Denies leaking of fluid and bleeding.  Frances Sanders RNC

## 2016-05-30 NOTE — Plan of Care (Signed)
Discharge instructions, both oral and written, given per MD and RN. Pt agrees with plan of care and with labor precautions.  Pt ready to be discharged home ambulatory with her mother. Frances Sanders RNC

## 2016-05-30 NOTE — Discharge Instructions (Signed)
Keep appt Monday with [facility]. Also has an appt Wednesday.  Labor precautions discussed per MD with pt.  She verbalized understanding of plan of care.

## 2016-06-09 ENCOUNTER — Ambulatory Visit: Payer: Medicaid Other | Admitting: *Deleted

## 2016-06-09 ENCOUNTER — Observation Stay
Admission: EM | Admit: 2016-06-09 | Discharge: 2016-06-09 | Disposition: A | Discharge status: Home / Self Care | Payer: Medicaid Other | Attending: Advanced Practice Midwife | Admitting: Advanced Practice Midwife

## 2016-06-09 DIAGNOSIS — O4703 False labor before 37 completed weeks of gestation, third trimester: Secondary | ICD-10-CM | POA: Diagnosis present

## 2016-06-09 DIAGNOSIS — Z3A36 36 weeks gestation of pregnancy: Secondary | ICD-10-CM | POA: Diagnosis not present

## 2016-06-09 DIAGNOSIS — O99343 Other mental disorders complicating pregnancy, third trimester: Secondary | ICD-10-CM | POA: Diagnosis not present

## 2016-06-09 DIAGNOSIS — O99333 Smoking (tobacco) complicating pregnancy, third trimester: Secondary | ICD-10-CM | POA: Insufficient documentation

## 2016-06-09 DIAGNOSIS — F17200 Nicotine dependence, unspecified, uncomplicated: Secondary | ICD-10-CM | POA: Insufficient documentation

## 2016-06-09 DIAGNOSIS — F332 Major depressive disorder, recurrent severe without psychotic features: Secondary | ICD-10-CM | POA: Diagnosis not present

## 2016-06-09 DIAGNOSIS — Z79899 Other long term (current) drug therapy: Secondary | ICD-10-CM | POA: Insufficient documentation

## 2016-06-09 MED ORDER — TERBUTALINE SULFATE 1 MG/ML IJ SOLN
0.2500 mg | Freq: Once | INTRAMUSCULAR | Status: AC
  Administered 2016-06-09: 0.25 mg via SUBCUTANEOUS
  Filled 2016-06-09: qty 1

## 2016-06-09 NOTE — Discharge Summary (Signed)
Obstetric History and Physical  Frances RifeStephanie B Geerts is a 35 y.o. G2P1001 with Estimated Date of Delivery: 07/03/16 who presents at 2728w4d  presenting for contractions. Patient states she has been having regular contractions, no vaginal bleeding, intact membranes, with active fetal movement.    Prenatal Course Source of Care: [facility] Pregnancy complications or risks: GDM Patient Active Problem List   Diagnosis Date Noted  . Labor and delivery, indication for care 06/09/2016  . Irregular contractions 05/30/2016  . Abdominal pain affecting pregnancy 03/17/2016  . Suicidal behavior 09/07/2015  . Severe recurrent major depression without psychotic features (HCC) 09/07/2015  . Benzodiazepine overdose 09/07/2015  . Tobacco use disorder 09/07/2015     Prenatal Transfer Tool   Past Medical History:  Diagnosis Date  . Anxiety   . Bipolar 1 disorder (HCC)   . Depression   . Insomnia     Past Surgical History:  Procedure Laterality Date  . APPENDECTOMY    . HERNIA REPAIR      OB History  Gravida Para Term Preterm AB Living  2 1 1     1   SAB TAB Ectopic Multiple Live Births               # Outcome Date GA Lbr Len/2nd Weight Sex Delivery Anes PTL Lv  2 Current           1 Term               Social History   Social History  . Marital status: Married    Spouse name: N/A  . Number of children: N/A  . Years of education: N/A   Social History Main Topics  . Smoking status: Current Every Day Smoker    Packs/day: 0.50  . Smokeless tobacco: Never Used  . Alcohol use No  . Drug use: No  . Sexual activity: Not Asked   Other Topics Concern  . None   Social History Narrative  . None    Family History  Problem Relation Age of Onset  . Diabetes Paternal Aunt   . Diabetes Paternal Uncle   . Cancer Maternal Grandmother   . Diabetes Maternal Grandmother   . Heart disease Maternal Grandmother   . Hypertension Maternal Grandmother   . Diabetes Maternal Grandfather      Meds: Ambien, prenatal vitamin  No Known Allergies  Review of Systems: Negative except for what is mentioned in HPI.  Physical Exam: BP 122/81 (BP Location: Left Arm)   Pulse (!) 118   Temp 98.4 F (36.9 C) (Oral)   Resp 18   Ht 5' (1.524 m)   Wt 128 lb (58.1 kg)   LMP 08/23/2015   BMI 25.00 kg/m  GENERAL: Well-developed, well-nourished female in no acute distress.  LUNGS: Clear to auscultation bilaterally.  HEART: Regular rate and rhythm. ABDOMEN: Soft, nontender, nondistended, gravid. EXTREMITIES: Nontender, no edema Cervical Exam: Dilatation 1.5 cm   Effacement 50 %   Station -2  -per RN, unchanged after approx 2 hours Presentation: cephalic FHT: Category: 1 Baseline rate 140 bpm   Variability moderate  Accelerations present   Decelerations none Contractions: Every 2-3 mins, mild   Pertinent Labs/Studies:   No results found for this or any previous visit (from the past 24 hour(s)).  Assessment : IUP at 8028w4d, false labor  Plan: Given Terbutaline which slowed contractions for about 30 minutes only.   Discharge Home

## 2016-06-13 ENCOUNTER — Observation Stay
Admission: EM | Admit: 2016-06-13 | Discharge: 2016-06-13 | Disposition: A | Discharge status: Home / Self Care | Payer: Medicaid Other | Attending: Obstetrics & Gynecology | Admitting: Obstetrics & Gynecology

## 2016-06-13 DIAGNOSIS — O26899 Other specified pregnancy related conditions, unspecified trimester: Secondary | ICD-10-CM | POA: Diagnosis present

## 2016-06-13 DIAGNOSIS — O26893 Other specified pregnancy related conditions, third trimester: Secondary | ICD-10-CM | POA: Diagnosis not present

## 2016-06-13 DIAGNOSIS — R109 Unspecified abdominal pain: Secondary | ICD-10-CM | POA: Diagnosis not present

## 2016-06-13 MED ORDER — ACETAMINOPHEN 325 MG PO TABS: 650.0000 mg | ORAL_TABLET | ORAL | Status: DC | PRN

## 2016-06-13 MED ORDER — ONDANSETRON HCL 4 MG/2ML IJ SOLN: 4.0000 mg | Freq: Four times a day (QID) | INTRAMUSCULAR | Status: DC | PRN

## 2016-06-13 NOTE — OB Triage Note (Signed)
[name] presents c/o abdominla pain starting at 0930 this morning. Reports diarrhea starting last night. Denies any bleeding. States she possibly may have LOF.

## 2016-06-13 NOTE — Discharge Instructions (Signed)
Drink plenty of fluids, get plenty of rest

## 2016-06-13 NOTE — Final Progress Note (Signed)
Physician Final Progress Note  Patient ID: Frances RifeStephanie B Sanders MRN: [id number] DOB/AGE: 33/09/1980 33 y.o.  Admit date: 06/13/2016 Admitting provider: Nadara Mustardobert P Jorene Kaylor, MD Discharge date: 06/13/2016  Admission Diagnoses: Abdominal pain, third trimester  Discharge Diagnoses: same  Significant Findings/ Diagnostic Studies: A NST procedure was performed with FHR monitoring and a normal baseline established, appropriate time of 20-40 minutes of evaluation, and accels >2 seen w 15x15 characteristics.  Results show a REACTIVE NST.   Discharge Condition: good  Disposition: 01-Home or Self Care  Diet: Regular diet  Discharge Activity: Activity as tolerated     Medication List    TAKE these medications   Brexpiprazole 1 MG Tabs Commonly known as:  REXULTI Take 2 mg by mouth every morning.   escitalopram 20 MG tablet Commonly known as:  LEXAPRO Take 1 tablet (20 mg total) by mouth daily.   LORazepam 1 MG tablet Commonly known as:  ATIVAN Take 1 mg by mouth daily.   multivitamin-prenatal 27-0.8 MG Tabs tablet Take 1 tablet by mouth daily at 12 noon.   ondansetron 4 MG tablet Commonly known as:  ZOFRAN Take 1 tablet (4 mg total) by mouth daily as needed for nausea or vomiting.   promethazine 25 MG tablet Commonly known as:  PHENERGAN Take 1 tablet (25 mg total) by mouth every 6 (six) hours as needed for nausea or vomiting.   traZODone 150 MG tablet Commonly known as:  DESYREL Take 1 tablet (150 mg total) by mouth at bedtime.   zolpidem 5 MG tablet Commonly known as:  AMBIEN Take 1 tablet (5 mg total) by mouth at bedtime as needed for sleep.        Total time spent taking care of this patient: 120 minutes  Signed: Letitia Libraobert Paul Juvia Aerts 06/13/2016, 7:23 PM

## 2016-06-13 NOTE — Discharge Summary (Signed)
  See FPN 

## 2016-06-13 NOTE — H&P (Signed)
  See FPN 

## 2016-06-21 ENCOUNTER — Encounter: Payer: Self-pay | Admitting: *Deleted

## 2016-06-21 ENCOUNTER — Observation Stay
Admission: EM | Admit: 2016-06-21 | Discharge: 2016-06-21 | Disposition: A | Discharge status: Home / Self Care | Payer: Medicaid Other | Attending: Obstetrics and Gynecology | Admitting: Obstetrics and Gynecology

## 2016-06-21 DIAGNOSIS — O471 False labor at or after 37 completed weeks of gestation: Secondary | ICD-10-CM | POA: Diagnosis not present

## 2016-06-21 DIAGNOSIS — O99333 Smoking (tobacco) complicating pregnancy, third trimester: Secondary | ICD-10-CM | POA: Diagnosis not present

## 2016-06-21 DIAGNOSIS — Z3A38 38 weeks gestation of pregnancy: Secondary | ICD-10-CM | POA: Insufficient documentation

## 2016-06-21 DIAGNOSIS — O24419 Gestational diabetes mellitus in pregnancy, unspecified control: Secondary | ICD-10-CM | POA: Diagnosis not present

## 2016-06-21 NOTE — Discharge Summary (Signed)
Obstetric Discharge Summary  Frances RifeStephanie B Sanders is a 35 y.o. G2P1001 with Estimated Date of Delivery: 07/03/16 who presents at 407w2d presenting for contractions. Patient states she has been having regular contractions, no vaginal bleeding, intact membranes, with active fetal movement.    Prenatal Course Source of Care: [facility] Pregnancy complications or risks: GDM Patient Active Problem List   Diagnosis Date Noted  . Labor and delivery, indication for care 06/09/2016  . Irregular contractions 05/30/2016  . Abdominal pain affecting pregnancy 03/17/2016  . Suicidal behavior 09/07/2015  . Severe recurrent major depression without psychotic features (HCC) 09/07/2015  . Benzodiazepine overdose 09/07/2015  . Tobacco use disorder 09/07/2015     Prenatal Transfer Tool   Past Medical History:  Diagnosis Date  . Anxiety   . Bipolar 1 disorder (HCC)   . Depression   . Insomnia     Past Surgical History:  Procedure Laterality Date  . APPENDECTOMY    . HERNIA REPAIR      OB History  Gravida Para Term Preterm AB Living  2 1 1     1   SAB TAB Ectopic Multiple Live Births               # Outcome Date GA Lbr Len/2nd Weight Sex Delivery Anes PTL Lv  2 Current           1 Term               Social History   Social History  . Marital status: Married    Spouse name: N/A  . Number of children: N/A  . Years of education: N/A   Social History Main Topics  . Smoking status: Current Every Day Smoker    Packs/day: 0.50    Types: Cigarettes  . Smokeless tobacco: Never Used  . Alcohol use No  . Drug use: No  . Sexual activity: Not Asked   Other Topics Concern  . None   Social History Narrative  . None    Family History  Problem Relation Age of Onset  . Diabetes Paternal Aunt   . Diabetes Paternal Uncle   . Cancer Maternal Grandmother   . Diabetes Maternal Grandmother   . Heart disease Maternal Grandmother   . Hypertension Maternal Grandmother   . Diabetes Maternal  Grandfather     Meds: Ambien, prenatal vitamin  No Known Allergies  Review of Systems: Negative except for what is mentioned in HPI.  Physical Exam: BP 109/75 (BP Location: Left Arm)   Pulse 81   Temp 97.8 F (36.6 C) (Oral)   Resp 18   Ht 5' (1.524 m)   Wt 129 lb (58.5 kg)   LMP 08/23/2015   BMI 25.19 kg/m  ABDOMEN: Soft, nontender, nondistended, gravid. Cervical Exam: Dilatation 2cm per RN, unchanged over 2 hours Presentation: cephalic FHT: Category: 1 Baseline rate 125 bpm   Variability moderate  Accelerations present   Decelerations none Contractions: Every 2-3 mins, mild   Pertinent Labs/Studies:   No results found for this or any previous visit (from the past 24 hour(s)).  Assessment : IUP at 247w2d, false labor  Plan: No evidence of active labor. Precautions given  Discharge Home   Thomasene MohairStephen Jemar Paulsen, MD 06/21/2016 8:19 PM

## 2016-06-27 ENCOUNTER — Observation Stay
Admission: EM | Admit: 2016-06-27 | Discharge: 2016-06-28 | Disposition: A | Discharge status: Home / Self Care | Payer: Medicaid Other | Attending: Obstetrics and Gynecology | Admitting: Obstetrics and Gynecology

## 2016-06-27 DIAGNOSIS — Z3A39 39 weeks gestation of pregnancy: Secondary | ICD-10-CM | POA: Diagnosis not present

## 2016-06-27 DIAGNOSIS — Z79899 Other long term (current) drug therapy: Secondary | ICD-10-CM | POA: Diagnosis not present

## 2016-06-27 DIAGNOSIS — O471 False labor at or after 37 completed weeks of gestation: Principal | ICD-10-CM | POA: Insufficient documentation

## 2016-06-27 DIAGNOSIS — O479 False labor, unspecified: Secondary | ICD-10-CM | POA: Diagnosis present

## 2016-06-27 NOTE — OB Triage Note (Signed)
Patient came in for observation for labor evaluation. Patient reports contractions every three to five minutes since 2100. Patient denies leaking of fluid, vaginal bleeding and spotting. Vital signs stable and patient afebrile. FHR baseline 135 with moderate variability with accelerations 15 x 15 and no decelerations. Husband at bedside. Will continue to monitor.

## 2016-06-28 DIAGNOSIS — O471 False labor at or after 37 completed weeks of gestation: Secondary | ICD-10-CM | POA: Diagnosis not present

## 2016-06-28 LAB — GLUCOSE, CAPILLARY: GLUCOSE-CAPILLARY: 84 mg/dL (ref 65–99)

## 2016-06-28 NOTE — Discharge Summary (Signed)
Patient discharged with instructions on labor precautions, follow up appointment, and when to seek medical attention. Patient discharged with steady gait and no complaints. Patient verbalized understanding of discharge instructions. Patient discharged with husband and son.

## 2016-06-28 NOTE — Final Progress Note (Signed)
Physician Final Progress Note  Patient ID: Frances RifeStephanie B Sanders MRN: [id number] DOB/AGE: 33/09/1980 35 y.o.  Admit date: 06/27/2016 Admitting provider: Vena AustriaAndreas Phaedra Colgate, MD Discharge date: 06/28/2016   Admission Diagnoses: Irregular contractions  Discharge Diagnoses:  Active Problems:   Irregular contractions  35 yo G2P1001 at 3114w2d presenting with contractions, no cervical change over 3-hrs.  Consults: None  Significant Findings/ Diagnostic Studies: none  Procedures: NST reactive category I tracing 130, moderate variability, +accels, no decels  Discharge Condition: good  Disposition: 01-Home or Self Care  Diet: Regular diet  Discharge Activity: Activity as tolerated  Discharge Instructions    Discharge activity:  No Restrictions    Complete by:  As directed    Discharge diet:  No restrictions    Complete by:  As directed    Fetal Kick Count:  Lie on our left side for one hour after a meal, and count the number of times your baby kicks.  If it is less than 5 times, get up, move around and drink some juice.  Repeat the test 30 minutes later.  If it is still less than 5 kicks in an hour, notify your doctor.    Complete by:  As directed    LABOR:  When conractions begin, you should start to time them from the beginning of one contraction to the beginning  of the next.  When contractions are 5 - 10 minutes apart or less and have been regular for at least an hour, you should call your health care provider.    Complete by:  As directed    No sexual activity restrictions    Complete by:  As directed    Notify physician for bleeding from the vagina    Complete by:  As directed    Notify physician for blurring of vision or spots before the eyes    Complete by:  As directed    Notify physician for chills or fever    Complete by:  As directed    Notify physician for fainting spells, "black outs" or loss of consciousness    Complete by:  As directed    Notify physician for  increase in vaginal discharge    Complete by:  As directed    Notify physician for leaking of fluid    Complete by:  As directed    Notify physician for pain or burning when urinating    Complete by:  As directed    Notify physician for pelvic pressure (sudden increase)    Complete by:  As directed    Notify physician for severe or continued nausea or vomiting    Complete by:  As directed    Notify physician for sudden gushing of fluid from the vagina (with or without continued leaking)    Complete by:  As directed    Notify physician for sudden, constant, or occasional abdominal pain    Complete by:  As directed    Notify physician if baby moving less than usual    Complete by:  As directed        Medication List    TAKE these medications   Brexpiprazole 1 MG Tabs Commonly known as:  REXULTI Take 2 mg by mouth every morning.   escitalopram 20 MG tablet Commonly known as:  LEXAPRO Take 1 tablet (20 mg total) by mouth daily.   LORazepam 1 MG tablet Commonly known as:  ATIVAN Take 1 mg by mouth daily.   multivitamin-prenatal 27-0.8 MG Tabs tablet Take  1 tablet by mouth daily at 12 noon.   ondansetron 4 MG tablet Commonly known as:  ZOFRAN Take 1 tablet (4 mg total) by mouth daily as needed for nausea or vomiting.   promethazine 25 MG tablet Commonly known as:  PHENERGAN Take 1 tablet (25 mg total) by mouth every 6 (six) hours as needed for nausea or vomiting.   traZODone 150 MG tablet Commonly known as:  DESYREL Take 1 tablet (150 mg total) by mouth at bedtime.   zolpidem 5 MG tablet Commonly known as:  AMBIEN Take 1 tablet (5 mg total) by mouth at bedtime as needed for sleep.        Total time spent taking care of this patient: 30 minutes  Signed: Lorrene ReidSTAEBLER, Frances Sanders 06/28/2016, 3:55 AM

## 2016-07-02 ENCOUNTER — Inpatient Hospital Stay: Payer: Medicaid Other | Admitting: Certified Registered"

## 2016-07-02 ENCOUNTER — Encounter: Payer: Self-pay | Admitting: *Deleted

## 2016-07-02 ENCOUNTER — Inpatient Hospital Stay
Admission: EM | Admit: 2016-07-02 | Discharge: 2016-07-04 | DRG: 767 | Disposition: A | Discharge status: Home / Self Care | Payer: Medicaid Other | Attending: Obstetrics & Gynecology | Admitting: Obstetrics & Gynecology

## 2016-07-02 DIAGNOSIS — O24429 Gestational diabetes mellitus in childbirth, unspecified control: Principal | ICD-10-CM | POA: Diagnosis present

## 2016-07-02 DIAGNOSIS — K219 Gastro-esophageal reflux disease without esophagitis: Secondary | ICD-10-CM | POA: Diagnosis present

## 2016-07-02 DIAGNOSIS — Z3A39 39 weeks gestation of pregnancy: Secondary | ICD-10-CM

## 2016-07-02 DIAGNOSIS — O9962 Diseases of the digestive system complicating childbirth: Secondary | ICD-10-CM | POA: Diagnosis present

## 2016-07-02 DIAGNOSIS — Z302 Encounter for sterilization: Secondary | ICD-10-CM | POA: Diagnosis not present

## 2016-07-02 DIAGNOSIS — O99334 Smoking (tobacco) complicating childbirth: Secondary | ICD-10-CM | POA: Diagnosis present

## 2016-07-02 DIAGNOSIS — O24419 Gestational diabetes mellitus in pregnancy, unspecified control: Secondary | ICD-10-CM | POA: Diagnosis present

## 2016-07-02 LAB — CBC
HEMATOCRIT: 36.4 % (ref 35.0–47.0)
HEMOGLOBIN: 12.4 g/dL (ref 12.0–16.0)
MCH: 32.7 pg (ref 26.0–34.0)
MCHC: 34.2 g/dL (ref 32.0–36.0)
MCV: 95.5 fL (ref 80.0–100.0)
Platelets: 322 10*3/uL (ref 150–440)
RBC: 3.81 MIL/uL (ref 3.80–5.20)
RDW: 14.1 % (ref 11.5–14.5)
WBC: 15.4 10*3/uL — ABNORMAL HIGH (ref 3.6–11.0)

## 2016-07-02 LAB — TYPE AND SCREEN
ABO/RH(D): A POS
ANTIBODY SCREEN: NEGATIVE

## 2016-07-02 LAB — GLUCOSE, CAPILLARY: Glucose-Capillary: 82 mg/dL (ref 65–99)

## 2016-07-02 MED ORDER — SENNOSIDES-DOCUSATE SODIUM 8.6-50 MG PO TABS
2.0000 | ORAL_TABLET | ORAL | Status: DC
  Administered 2016-07-03: 2 via ORAL
  Filled 2016-07-02: qty 2

## 2016-07-02 MED ORDER — MISOPROSTOL 200 MCG PO TABS
ORAL_TABLET | ORAL | Status: AC
  Filled 2016-07-02: qty 4

## 2016-07-02 MED ORDER — OXYCODONE-ACETAMINOPHEN 5-325 MG PO TABS: 1.0000 | ORAL_TABLET | ORAL | Status: DC | PRN

## 2016-07-02 MED ORDER — LIDOCAINE HCL (PF) 1 % IJ SOLN
INTRAMUSCULAR | Status: AC
  Filled 2016-07-02: qty 30

## 2016-07-02 MED ORDER — AMMONIA AROMATIC IN INHA
RESPIRATORY_TRACT | Status: AC
  Filled 2016-07-02: qty 10

## 2016-07-02 MED ORDER — EPHEDRINE 5 MG/ML INJ
10.0000 mg | INTRAVENOUS | Status: DC | PRN
  Filled 2016-07-02: qty 2

## 2016-07-02 MED ORDER — LIDOCAINE-EPINEPHRINE (PF) 1.5 %-1:200000 IJ SOLN
INTRAMUSCULAR | Status: DC | PRN
  Administered 2016-07-02: 3 mL via EPIDURAL

## 2016-07-02 MED ORDER — ONDANSETRON HCL 4 MG/2ML IJ SOLN
4.0000 mg | Freq: Four times a day (QID) | INTRAMUSCULAR | Status: DC | PRN
  Administered 2016-07-02: 4 mg via INTRAVENOUS
  Filled 2016-07-02: qty 2

## 2016-07-02 MED ORDER — MEASLES, MUMPS & RUBELLA VAC ~~LOC~~ INJ: 0.5000 mL | INJECTION | Freq: Once | SUBCUTANEOUS | Status: DC

## 2016-07-02 MED ORDER — TERBUTALINE SULFATE 1 MG/ML IJ SOLN: 0.2500 mg | Freq: Once | INTRAMUSCULAR | Status: DC | PRN

## 2016-07-02 MED ORDER — ONDANSETRON HCL 4 MG/2ML IJ SOLN
4.0000 mg | INTRAMUSCULAR | Status: DC | PRN
  Administered 2016-07-03: 4 mg via INTRAVENOUS

## 2016-07-02 MED ORDER — ZOLPIDEM TARTRATE 5 MG PO TABS: 5.0000 mg | ORAL_TABLET | Freq: Every evening | ORAL | Status: DC | PRN

## 2016-07-02 MED ORDER — OXYTOCIN BOLUS FROM INFUSION
500.0000 mL | Freq: Once | INTRAVENOUS | Status: AC
  Administered 2016-07-02: 500 mL via INTRAVENOUS

## 2016-07-02 MED ORDER — SODIUM CHLORIDE 0.9 % IV SOLN: 250.0000 mL | INTRAVENOUS | Status: DC | PRN

## 2016-07-02 MED ORDER — PHENYLEPHRINE 40 MCG/ML (10ML) SYRINGE FOR IV PUSH (FOR BLOOD PRESSURE SUPPORT)
80.0000 ug | PREFILLED_SYRINGE | INTRAVENOUS | Status: DC | PRN
  Filled 2016-07-02: qty 5

## 2016-07-02 MED ORDER — DIBUCAINE 1 % RE OINT: 1.0000 "application " | TOPICAL_OINTMENT | RECTAL | Status: DC | PRN

## 2016-07-02 MED ORDER — COCONUT OIL OIL
1.0000 | TOPICAL_OIL | Status: DC | PRN
Start: 2016-07-02 — End: 2016-07-04

## 2016-07-02 MED ORDER — LIDOCAINE HCL (PF) 1 % IJ SOLN
INTRAMUSCULAR | Status: DC | PRN
  Administered 2016-07-02: 3 mL via SUBCUTANEOUS

## 2016-07-02 MED ORDER — FENTANYL 2.5 MCG/ML W/ROPIVACAINE 0.2% IN NS 100 ML EPIDURAL INFUSION (ARMC-ANES)
EPIDURAL | Status: DC | PRN
  Administered 2016-07-02: 9 mL/h via EPIDURAL

## 2016-07-02 MED ORDER — ACETAMINOPHEN 325 MG PO TABS
650.0000 mg | ORAL_TABLET | ORAL | Status: DC | PRN
Start: 2016-07-02 — End: 2016-07-04
  Administered 2016-07-03 (×2): 650 mg via ORAL
  Filled 2016-07-02 (×2): qty 2

## 2016-07-02 MED ORDER — IBUPROFEN 600 MG PO TABS
600.0000 mg | ORAL_TABLET | Freq: Four times a day (QID) | ORAL | Status: DC
  Administered 2016-07-03 – 2016-07-04 (×6): 600 mg via ORAL
  Filled 2016-07-02 (×6): qty 1

## 2016-07-02 MED ORDER — BUPIVACAINE HCL (PF) 0.25 % IJ SOLN
INTRAMUSCULAR | Status: DC | PRN
  Administered 2016-07-02: 5 mL via EPIDURAL
  Administered 2016-07-02: 3 mL via EPIDURAL

## 2016-07-02 MED ORDER — FENTANYL 2.5 MCG/ML W/ROPIVACAINE 0.2% IN NS 100 ML EPIDURAL INFUSION (ARMC-ANES): 10.0000 mL/h | EPIDURAL | Status: DC

## 2016-07-02 MED ORDER — SODIUM CHLORIDE 0.9% FLUSH: 3.0000 mL | Freq: Two times a day (BID) | INTRAVENOUS | Status: DC

## 2016-07-02 MED ORDER — LACTATED RINGERS IV SOLN
INTRAVENOUS | Status: DC
  Administered 2016-07-02: 23:00:00 via INTRAVENOUS
  Administered 2016-07-02: 125 mL/h via INTRAVENOUS

## 2016-07-02 MED ORDER — ACETAMINOPHEN 325 MG PO TABS: 650.0000 mg | ORAL_TABLET | ORAL | Status: DC | PRN

## 2016-07-02 MED ORDER — ONDANSETRON HCL 4 MG PO TABS
4.0000 mg | ORAL_TABLET | ORAL | Status: DC | PRN
  Administered 2016-07-04: 4 mg via ORAL
  Filled 2016-07-02: qty 1

## 2016-07-02 MED ORDER — BUTORPHANOL TARTRATE 1 MG/ML IJ SOLN
1.0000 mg | INTRAMUSCULAR | Status: DC | PRN
  Administered 2016-07-02: 1 mg via INTRAVENOUS
  Filled 2016-07-02: qty 1

## 2016-07-02 MED ORDER — DIPHENHYDRAMINE HCL 25 MG PO CAPS: 25.0000 mg | ORAL_CAPSULE | Freq: Four times a day (QID) | ORAL | Status: DC | PRN

## 2016-07-02 MED ORDER — DIPHENHYDRAMINE HCL 50 MG/ML IJ SOLN: 12.5000 mg | INTRAMUSCULAR | Status: DC | PRN

## 2016-07-02 MED ORDER — SODIUM CHLORIDE 0.9% FLUSH: 3.0000 mL | INTRAVENOUS | Status: DC | PRN

## 2016-07-02 MED ORDER — OXYTOCIN 40 UNITS IN LACTATED RINGERS INFUSION - SIMPLE MED
2.5000 [IU]/h | INTRAVENOUS | Status: DC
  Filled 2016-07-02: qty 1000

## 2016-07-02 MED ORDER — FENTANYL 2.5 MCG/ML W/ROPIVACAINE 0.2% IN NS 100 ML EPIDURAL INFUSION (ARMC-ANES)
EPIDURAL | Status: AC
  Filled 2016-07-02: qty 100

## 2016-07-02 MED ORDER — LACTATED RINGERS IV SOLN: 500.0000 mL | Freq: Once | INTRAVENOUS | Status: DC

## 2016-07-02 MED ORDER — LACTATED RINGERS IV SOLN: 500.0000 mL | INTRAVENOUS | Status: DC | PRN

## 2016-07-02 MED ORDER — SODIUM CHLORIDE FLUSH 0.9 % IV SOLN
INTRAVENOUS | Status: AC
  Filled 2016-07-02: qty 20

## 2016-07-02 MED ORDER — OXYCODONE-ACETAMINOPHEN 5-325 MG PO TABS: 2.0000 | ORAL_TABLET | ORAL | Status: DC | PRN

## 2016-07-02 MED ORDER — BENZOCAINE-MENTHOL 20-0.5 % EX AERO
1.0000 "application " | INHALATION_SPRAY | CUTANEOUS | Status: DC | PRN
  Administered 2016-07-03: 1 via TOPICAL
  Filled 2016-07-02: qty 56

## 2016-07-02 MED ORDER — WITCH HAZEL-GLYCERIN EX PADS: 1.0000 "application " | MEDICATED_PAD | CUTANEOUS | Status: DC | PRN

## 2016-07-02 MED ORDER — OXYTOCIN 10 UNIT/ML IJ SOLN
INTRAMUSCULAR | Status: AC
  Filled 2016-07-02: qty 2

## 2016-07-02 MED ORDER — OXYTOCIN 40 UNITS IN LACTATED RINGERS INFUSION - SIMPLE MED
1.0000 m[IU]/min | INTRAVENOUS | Status: DC
  Administered 2016-07-02: 1 m[IU]/min via INTRAVENOUS

## 2016-07-02 MED ORDER — SIMETHICONE 80 MG PO CHEW: 80.0000 mg | CHEWABLE_TABLET | ORAL | Status: DC | PRN

## 2016-07-02 NOTE — Discharge Instructions (Signed)

## 2016-07-02 NOTE — Progress Notes (Signed)
  Labor Progress Note   35 y.o. G2P1001 @ 5766w6d , admitted for  Pregnancy, Labor Management.   Subjective:  Painful ctxs q 1-2 min  Objective:  BP 113/78 (BP Location: Right Arm)   Pulse 82   Temp 97.5 F (36.4 C)   Resp 17   Ht 5' (1.524 m)   Wt 130 lb (59 kg)   LMP 08/23/2015   BMI 25.39 kg/m  Abd: moderate Extr: trace to 1+ bilateral pedal edema SVE: CERVIX: 6 cm dilated, 90 effaced, +1 station  EFM: FHR: 140 bpm, variability: moderate,  accelerations:  Present,  decelerations:  Absent Toco: Frequency: Every 1-2 minutes  Assessment & Plan:  G2P1001 @ 366w6d, admitted for  Pregnancy and Labor/Delivery Management  1. Pain management: none. 2. FWB: FHT category 1.  3. ID: GBS negative 4. Labor management: Epidural desired.  SROM clear. Cont Pitocin IOL.   All discussed with patient, see orders

## 2016-07-02 NOTE — Anesthesia Procedure Notes (Signed)
Epidural Patient location during procedure: OB  Staffing Anesthesiologist: Yevette EdwardsADAMS, JAMES G Resident/CRNA: Mathews ArgyleLOGAN, Madix Blowe Performed: resident/CRNA   Preanesthetic Checklist Completed: patient identified, site marked, surgical consent, pre-op evaluation, timeout performed, IV checked, risks and benefits discussed and monitors and equipment checked  Epidural Patient position: sitting Prep: Betadine and site prepped and draped Patient monitoring: heart rate, continuous pulse ox and blood pressure Approach: midline Location: L4-L5 Injection technique: LOR saline  Needle:  Needle type: Tuohy  Needle gauge: 18 G Needle length: 9 cm and 9 Needle insertion depth: 7 cm Catheter type: closed end flexible Catheter size: 20 Guage Catheter at skin depth: 12 cm Test dose: negative and 1.5% lidocaine with Epi 1:200 K  Assessment Events: blood not aspirated, injection not painful, no injection resistance, negative IV test and no paresthesia  Additional Notes   Patient tolerated the insertion well without complications.Reason for block:procedure for pain

## 2016-07-02 NOTE — Discharge Summary (Signed)
Obstetrical Discharge Summary  Date of Admission: 07/02/2016 Date of Discharge: 07/04/16  Discharge Diagnosis: Term Pregnancy-delivered Primary OB:  [facility]   Gestational Age at Delivery: 2679w6d  Antepartum complications: gestational diabetes Date of Delivery: 07/04/2016  Delivered By: Annamarie MajorPaul Ivylynn Hoppes, MD Delivery Type: spontaneous vaginal delivery and one application of vacuum with pop off Intrapartum complications/course: None Anesthesia: epidural Placenta: spontaneous Laceration: none Episiotomy: none Live born female  Birth Weight:  6-7 APGAR: 8,9   Delivery Note Primary OB: [facility] Delivery Physician: Annamarie MajorPaul Delesa Kawa, MD Gestational Age: Full term Antepartum complications: gestational diabetes Intrapartum complications: None  A viable Female was delivered via vertex perentation. Vacuum was placed once due to recurrent variable decels and numbness from epidural making pushing effort inadequate, and had pop off after bringing vertex down significantly, baby then delivered on next push by patient. Apgars:8 ,9  .   Placenta status: spontaneous and Intact.  Cord: 3+ vessels;  with the following complications: none.  Anesthesia:  epidural Episiotomy:  none Lacerations:  none Suture Repair: none Est. Blood Loss (mL):  200 mL  Mom to postpartum.  Baby to Couplet care / Skin to Skin.   Post partum course: Since the delivery, patient has tolerate activity, diet, and daily functions without difficulty or complication.  Min lochia.  No breast concerns at this time.  No signs of depression currently.   Postpartum Exam:General appearance: alert, cooperative and no distress GI: soft, non-tender; bowel sounds normal; no masses,  no organomegaly Extremities: extremities normal, atraumatic, no cyanosis or edema Breast: normal contour with no evidence of flattening or dimpling, skin normal  Disposition: home with infant Rh Immune globulin given: no Rubella vaccine given: yes Varicella  vaccine given: no Tdap vaccine given in AP or PP setting: given during prenatal care Flu vaccine given in AP or PP setting: given during prenatal care Contraception: bilateral tubal ligation  Prenatal Labs: A POS//Rubella Not immune//RPR negative//HIV negative/HepB Surface Ag negative//plans to breastfeed, plans to bottle feed  Plan:  Alwyn RenStephanie B Abruzzese was discharged to home in good condition. [facility]  Discharge Medications:   Medication List    TAKE these medications   Brexpiprazole 1 MG Tabs Commonly known as:  REXULTI Take 2 mg by mouth every morning.   escitalopram 20 MG tablet Commonly known as:  LEXAPRO Take 1 tablet (20 mg total) by mouth daily.   HYDROmorphone 2 MG tablet Commonly known as:  DILAUDID Take 1-2 tablets (2-4 mg total) by mouth every 4 (four) hours as needed for moderate pain or severe pain.   LORazepam 1 MG tablet Commonly known as:  ATIVAN Take 1 mg by mouth daily.   multivitamin-prenatal 27-0.8 MG Tabs tablet Take 1 tablet by mouth daily at 12 noon.   ondansetron 4 MG tablet Commonly known as:  ZOFRAN Take 1 tablet (4 mg total) by mouth daily as needed for nausea or vomiting.   promethazine 25 MG tablet Commonly known as:  PHENERGAN Take 1 tablet (25 mg total) by mouth every 6 (six) hours as needed for nausea or vomiting.   traZODone 150 MG tablet Commonly known as:  DESYREL Take 1 tablet (150 mg total) by mouth at bedtime.   zolpidem 5 MG tablet Commonly known as:  AMBIEN Take 1 tablet (5 mg total) by mouth at bedtime as needed for sleep.       Follow-up arrangements:  6 weeks

## 2016-07-02 NOTE — Anesthesia Preprocedure Evaluation (Signed)
Anesthesia Evaluation  Patient identified by MRN, date of birth, ID band Patient awake    Reviewed: Allergy & Precautions, H&P , NPO status , Patient's Chart, lab work & pertinent test results, reviewed documented beta blocker date and time   Airway Mallampati: II  TM Distance: >3 FB Neck ROM: full    Dental no notable dental hx. (+) Teeth Intact   Pulmonary neg pulmonary ROS, Current Smoker,    Pulmonary exam normal breath sounds clear to auscultation       Cardiovascular Exercise Tolerance: Good negative cardio ROS   Rhythm:regular Rate:Normal     Neuro/Psych negative neurological ROS  negative psych ROS   GI/Hepatic negative GI ROS, Neg liver ROS,   Endo/Other  negative endocrine ROSdiabetes  Renal/GU      Musculoskeletal   Abdominal   Peds  Hematology negative hematology ROS (+)   Anesthesia Other Findings   Reproductive/Obstetrics (+) Pregnancy                             Anesthesia Physical Anesthesia Plan  ASA: II  Anesthesia Plan: Epidural   Post-op Pain Management:    Induction:   Airway Management Planned:   Additional Equipment:   Intra-op Plan:   Post-operative Plan:   Informed Consent: I have reviewed the patients History and Physical, chart, labs and discussed the procedure including the risks, benefits and alternatives for the proposed anesthesia with the patient or authorized representative who has indicated his/her understanding and acceptance.     Plan Discussed with:   Anesthesia Plan Comments:         Anesthesia Quick Evaluation  

## 2016-07-02 NOTE — H&P (Signed)
History and Physical Interval Note:  07/02/2016 8:27 AM  Frances Sanders  has presented today for INDUCTION OF LABOR (pitocin, amniotomy),  with the diagnosis of A1 DM and ACD. The various methods of treatment have been discussed with the patient and family. After consideration of risks, benefits and other options for treatment, the patient has consented to  Labor induction .  The patient's history has been reviewed, patient examined, no change in status, and is stable for induction as planned.  See H&P. I have reviewed the patient's chart and labs.  Questions were answered to the patient's satisfaction.    Frances Sanders

## 2016-07-03 ENCOUNTER — Inpatient Hospital Stay: Payer: Medicaid Other | Admitting: Anesthesiology

## 2016-07-03 ENCOUNTER — Encounter
Admission: EM | Disposition: A | Discharge status: Home / Self Care | Payer: Self-pay | Source: Home / Self Care | Attending: Obstetrics & Gynecology

## 2016-07-03 ENCOUNTER — Encounter: Payer: Self-pay | Admitting: *Deleted

## 2016-07-03 HISTORY — PX: TUBAL LIGATION: SHX77

## 2016-07-03 LAB — CBC
HCT: 31.1 % — ABNORMAL LOW (ref 35.0–47.0)
HEMOGLOBIN: 10.9 g/dL — AB (ref 12.0–16.0)
MCH: 33.7 pg (ref 26.0–34.0)
MCHC: 35.1 g/dL (ref 32.0–36.0)
MCV: 96 fL (ref 80.0–100.0)
PLATELETS: 302 10*3/uL (ref 150–440)
RBC: 3.24 MIL/uL — AB (ref 3.80–5.20)
RDW: 13.8 % (ref 11.5–14.5)
WBC: 17.2 10*3/uL — ABNORMAL HIGH (ref 3.6–11.0)

## 2016-07-03 LAB — RPR: RPR: NONREACTIVE

## 2016-07-03 LAB — GLUCOSE, CAPILLARY: Glucose-Capillary: 72 mg/dL (ref 65–99)

## 2016-07-03 SURGERY — LIGATION, FALLOPIAN TUBE, POSTPARTUM
Anesthesia: General | Laterality: Bilateral | Wound class: Clean Contaminated

## 2016-07-03 MED ORDER — FENTANYL CITRATE (PF) 100 MCG/2ML IJ SOLN
INTRAMUSCULAR | Status: DC | PRN
  Administered 2016-07-03 (×2): 50 ug via INTRAVENOUS

## 2016-07-03 MED ORDER — BUPIVACAINE HCL 0.5 % IJ SOLN
INTRAMUSCULAR | Status: DC | PRN
  Administered 2016-07-03: 20 mL

## 2016-07-03 MED ORDER — MIDAZOLAM HCL 2 MG/2ML IJ SOLN
INTRAMUSCULAR | Status: DC | PRN
  Administered 2016-07-03: 1 mg via INTRAVENOUS

## 2016-07-03 MED ORDER — PHENYLEPHRINE HCL 10 MG/ML IJ SOLN
INTRAMUSCULAR | Status: DC | PRN
  Administered 2016-07-03: 100 ug via INTRAVENOUS

## 2016-07-03 MED ORDER — PROPOFOL 10 MG/ML IV BOLUS
INTRAVENOUS | Status: DC | PRN
  Administered 2016-07-03: 150 mg via INTRAVENOUS

## 2016-07-03 MED ORDER — LIDOCAINE HCL (CARDIAC) 20 MG/ML IV SOLN
INTRAVENOUS | Status: DC | PRN
  Administered 2016-07-03: 30 mg via INTRAVENOUS

## 2016-07-03 MED ORDER — NEOSTIGMINE METHYLSULFATE 10 MG/10ML IV SOLN
INTRAVENOUS | Status: DC | PRN
  Administered 2016-07-03: 3 mg via INTRAVENOUS

## 2016-07-03 MED ORDER — FENTANYL CITRATE (PF) 100 MCG/2ML IJ SOLN
INTRAMUSCULAR | Status: AC
  Administered 2016-07-03: 25 ug via INTRAVENOUS
  Filled 2016-07-03: qty 2

## 2016-07-03 MED ORDER — SUCCINYLCHOLINE CHLORIDE 20 MG/ML IJ SOLN
INTRAMUSCULAR | Status: DC | PRN
  Administered 2016-07-03: 80 mg via INTRAVENOUS

## 2016-07-03 MED ORDER — LACTATED RINGERS IV SOLN
INTRAVENOUS | Status: DC | PRN
  Administered 2016-07-03 (×2): via INTRAVENOUS

## 2016-07-03 MED ORDER — OXYCODONE HCL 5 MG PO TABS: 5.0000 mg | ORAL_TABLET | Freq: Once | ORAL | Status: DC | PRN

## 2016-07-03 MED ORDER — GLYCOPYRROLATE 0.2 MG/ML IJ SOLN
INTRAMUSCULAR | Status: DC | PRN
  Administered 2016-07-03: 0.6 mg via INTRAVENOUS

## 2016-07-03 MED ORDER — FENTANYL CITRATE (PF) 100 MCG/2ML IJ SOLN
25.0000 ug | INTRAMUSCULAR | Status: AC | PRN
  Administered 2016-07-03 (×6): 25 ug via INTRAVENOUS

## 2016-07-03 MED ORDER — HYDROMORPHONE HCL 2 MG PO TABS
2.0000 mg | ORAL_TABLET | ORAL | Status: DC | PRN
  Administered 2016-07-03 – 2016-07-04 (×5): 4 mg via ORAL
  Filled 2016-07-03 (×5): qty 2

## 2016-07-03 MED ORDER — ROCURONIUM BROMIDE 100 MG/10ML IV SOLN
INTRAVENOUS | Status: DC | PRN
  Administered 2016-07-03: 20 mg via INTRAVENOUS
  Administered 2016-07-03: 10 mg via INTRAVENOUS

## 2016-07-03 MED ORDER — OXYCODONE HCL 5 MG/5ML PO SOLN: 5.0000 mg | Freq: Once | ORAL | Status: DC | PRN

## 2016-07-03 SURGICAL SUPPLY — 24 items
CHLORAPREP W/TINT 26ML (MISCELLANEOUS) ×2 IMPLANT
DERMABOND ADVANCED (GAUZE/BANDAGES/DRESSINGS) ×1
DERMABOND ADVANCED .7 DNX12 (GAUZE/BANDAGES/DRESSINGS) ×1 IMPLANT
DRAPE LAPAROTOMY 77X122 PED (DRAPES) ×2 IMPLANT
DRAPE LAPAROTOMY T 102X78X121 (DRAPES) ×2 IMPLANT
DRESSING TELFA 4X3 1S ST N-ADH (GAUZE/BANDAGES/DRESSINGS) ×2 IMPLANT
DRSG TEGADERM 2-3/8X2-3/4 SM (GAUZE/BANDAGES/DRESSINGS) ×2 IMPLANT
ELECT CAUTERY BLADE 6.4 (BLADE) ×2 IMPLANT
ELECT REM PT RETURN 9FT ADLT (ELECTROSURGICAL) ×2
ELECTRODE REM PT RTRN 9FT ADLT (ELECTROSURGICAL) ×1 IMPLANT
GLOVE BIO SURGEON STRL SZ8 (GLOVE) ×8 IMPLANT
GOWN STRL REUS W/ TWL LRG LVL3 (GOWN DISPOSABLE) ×1 IMPLANT
GOWN STRL REUS W/ TWL XL LVL3 (GOWN DISPOSABLE) ×1 IMPLANT
GOWN STRL REUS W/TWL LRG LVL3 (GOWN DISPOSABLE) ×1
GOWN STRL REUS W/TWL XL LVL3 (GOWN DISPOSABLE) ×1
LABEL OR SOLS (LABEL) ×2 IMPLANT
NDL SAFETY 22GX1.5 (NEEDLE) ×2 IMPLANT
NS IRRIG 500ML POUR BTL (IV SOLUTION) ×2 IMPLANT
PACK BASIN MINOR ARMC (MISCELLANEOUS) ×2 IMPLANT
STRAP SAFETY BODY (MISCELLANEOUS) ×2 IMPLANT
SUT VIC AB 0 SH 27 (SUTURE) ×2 IMPLANT
SUT VIC AB 2-0 UR6 27 (SUTURE) ×2 IMPLANT
SUT VIC AB 4-0 PS2 18 (SUTURE) ×4 IMPLANT
SYRINGE 10CC LL (SYRINGE) ×2 IMPLANT

## 2016-07-03 NOTE — Progress Notes (Signed)
RN in room with informed consent for pt to sign; RN saw the order from Dr. Tiburcio PeaHarris to get the consent; RN also saw the "attestation" order from Dr. Tiburcio PeaHarris; RN in room and showed informed consent to pt; RN asked if pt had "spoken with a doctor about the bilateral tubal procedure, for example, how it's performed, the risks and benefits and had your questions answered if you have any?"; pt looked at RN and wrinkled her forehead and said, "um, I don't think so"; RN asked a little differently, "maybe before or after you delivered did Dr. Tiburcio PeaHarris mention anything about your surgery tomorrow, about how he's going to do the procedure, any risks and did your questions get answered?"; once again the pt said "no, I don't think so"; RN told pt that she is to be informed about her surgery and then sign the informed consent; RN placed informed consent in pt's chart

## 2016-07-03 NOTE — Anesthesia Preprocedure Evaluation (Signed)
Anesthesia Evaluation  Patient identified by MRN, date of birth, ID band Patient awake    Reviewed: Allergy & Precautions, H&P , NPO status , Patient's Chart, lab work & pertinent test results  History of Anesthesia Complications Negative for: history of anesthetic complications  Airway Mallampati: III  TM Distance: <3 FB Neck ROM: full    Dental no notable dental hx. (+) Poor Dentition, Missing   Pulmonary neg shortness of breath, Current Smoker,    Pulmonary exam normal breath sounds clear to auscultation       Cardiovascular Exercise Tolerance: Good (-) angina(-) Past MI and (-) DOE negative cardio ROS Normal cardiovascular exam Rhythm:regular Rate:Normal     Neuro/Psych PSYCHIATRIC DISORDERS Anxiety Depression Bipolar Disorder negative neurological ROS     GI/Hepatic Neg liver ROS, GERD  Controlled,  Endo/Other  diabetes, Gestational  Renal/GU      Musculoskeletal   Abdominal   Peds  Hematology negative hematology ROS (+)   Anesthesia Other Findings Past Medical History: No date: Anxiety No date: Bipolar 1 disorder (HCC) No date: Depression No date: Insomnia  Past Surgical History: No date: APPENDECTOMY No date: CESAREAN SECTION No date: HERNIA REPAIR  BMI    Body Mass Index:  25.39 kg/m      Reproductive/Obstetrics negative OB ROS                             Anesthesia Physical Anesthesia Plan  ASA: III  Anesthesia Plan: General ETT   Post-op Pain Management:    Induction:   Airway Management Planned:   Additional Equipment:   Intra-op Plan:   Post-operative Plan:   Informed Consent: I have reviewed the patients History and Physical, chart, labs and discussed the procedure including the risks, benefits and alternatives for the proposed anesthesia with the patient or authorized representative who has indicated his/her understanding and acceptance.   Dental  Advisory Given  Plan Discussed with: Anesthesiologist, CRNA and Surgeon  Anesthesia Plan Comments:         Anesthesia Quick Evaluation

## 2016-07-03 NOTE — Op Note (Signed)
Operative Note  07/03/2016  PRE-OP DIAGNOSIS: Desire for sterility  POST-OP DIAGNOSIS: same  SURGEON: Annamarie MajorPaul Harris, MD, FACOG  PROCEDURE: Postpartum Bilateral Tubal Ligation Procedure   ANESTHESIA: Choice   ESTIMATED BLOOD LOSS: Min  SPECIMENS: Portion of left and right tube  COMPLICATIONS: None  DISPOSITION: PACU - hemodynamically stable.  CONDITION: stable  FINDINGS: Exam under anesthesia revealed small, mobile  uterus with no masses and bilateral adnexa without masses or fullness.   TECHNIQUE:  Patient is prepped and draped in usual sterile fashion after adequate anesthesia is obtained in the supine position on the operating room table.  Local anesthesia is injected into the skin just inferior to the umbilicus, followed by a small elliptical incision with a scapel.  Fascia is identified and tented upwards, and an incision is made with Mayo scissors.  Identification of no adherent bowel is made. Retractors are placed and trendelenburg positioning is achieved.    The left Fallopian tube was identified, grasped with the Babcock clamps, lifted to the skin incision and followed out distally to the fimbriae. Fallopian tube is identified and clamped with two Kelly clamps to include fimbriae to completely excise. Excellent hemostasis was noted and the tube was returned to the abdomen. Attention was then turned to the right fallopian tube after confirmation of identification by tracing the tube out to the fimbriae. The same procedure was then performed on the right Fallopian tube. Again, excellent hemostasis was noted at the end of the procedure.  Retractors are removed and fascia closed with a 2-0 Vicryl suture. Irrigation and hemostasis confirmed.  Skin closed with a 4-0 vicryl suture in a subcuticular fashion followed by skin adhesive.  Pt goes to recovery room in stable fashion.  All counts correct times 2.

## 2016-07-03 NOTE — Anesthesia Postprocedure Evaluation (Signed)
Anesthesia Post Note  Patient: Frances RenStephanie B Sanders  Procedure(s) Performed: * No procedures listed *  Patient location during evaluation: PACU Anesthesia Type: General Level of consciousness: awake and alert Pain management: pain level controlled Vital Signs Assessment: post-procedure vital signs reviewed and stable Respiratory status: spontaneous breathing, nonlabored ventilation, respiratory function stable and patient connected to nasal cannula oxygen Cardiovascular status: blood pressure returned to baseline and stable Postop Assessment: no signs of nausea or vomiting Anesthetic complications: no    Last Vitals:  Vitals:   07/03/16 0330 07/03/16 0820  BP: 110/65 97/63  Pulse: 80 78  Resp: 20 18  Temp: 37 C 37 C    Last Pain:  Vitals:   07/03/16 0820  TempSrc: Oral  PainSc:                  [name]

## 2016-07-03 NOTE — Progress Notes (Signed)
Pt wanting to "step off the unit"; RN went over safety concerns for pt; RN would rather pt just ambulate on the 3rd floor but IF the pt has to leave the floor, please take someone with her; pt agreed; also, RN reminded pt that hospital is smoke free place; RN told pt that she's required to tell folks that; pt left unit in wheelchair escorted by husband to "step off the unit"; pt's other son (33 year old) in room with baby; RN did ask pt if she was ok with her 33 year old staying with the baby (as it was the pt's idea); pt said "of course"; RN showed 33 year old son the call bell and said to press it and say "i need the nurse" if he felt worried/uncomfortable at all; pt and her husband to return in about 5-10 minutes

## 2016-07-03 NOTE — Anesthesia Procedure Notes (Signed)
Performed by: Kady Toothaker       

## 2016-07-03 NOTE — Anesthesia Procedure Notes (Addendum)
Procedure Name: Intubation Date/Time: 07/03/2016 2:05 PM Performed by: Charna BusmanIAMOND, Frances Sanders Pre-anesthesia Checklist: Patient identified, Patient being monitored, Timeout performed, Emergency Drugs available and Suction available Patient Re-evaluated:Patient Re-evaluated prior to inductionOxygen Delivery Method: Circle system utilized Preoxygenation: Pre-oxygenation with 100% oxygen Intubation Type: IV induction and Combination inhalational/ intravenous induction Ventilation: Mask ventilation without difficulty Laryngoscope Size: 3 and Miller Grade View: Grade II Tube type: Oral Tube size: 6.0 mm Number of attempts: 1 Airway Equipment and Method: Stylet Placement Confirmation: ETT inserted through vocal cords under direct vision,  positive ETCO2 and breath sounds checked- equal and bilateral Secured at: 21 cm Tube secured with: Tape Dental Injury: Teeth and Oropharynx as per pre-operative assessment

## 2016-07-03 NOTE — Transfer of Care (Signed)
Immediate Anesthesia Transfer of Care Note  Patient: Frances Sanders  Procedure(s) Performed: Procedure(s): POST PARTUM TUBAL LIGATION BY SALPINGECTOMY (Bilateral)  Patient Location: PACU  Anesthesia Type:General  Level of Consciousness: awake, oriented and patient cooperative  Airway & Oxygen Therapy: Patient Spontanous Breathing and Patient connected to face mask oxygen  Post-op Assessment: Report given to RN and Post -op Vital signs reviewed and stable  Post vital signs: Reviewed and stable  Last Vitals:  Vitals:   07/03/16 1246 07/03/16 1445  BP: (!) 103/51 (P) 128/77  Pulse:  (P) 96  Resp:  (P) 16  Temp: (!) 36 C (P) 36.5 C    Last Pain:  Vitals:   07/03/16 1246  TempSrc: Tympanic  PainSc: 3          Complications: No apparent anesthesia complications

## 2016-07-03 NOTE — Progress Notes (Signed)
History and Physical Interval Note:  07/03/2016 9:50 AM  Frances Sanders  has presented today for surgery, with the diagnosis of DESIRE FOR PERMANENT STERILITY  The various methods of treatment have been discussed with the patient and family. After consideration of risks, benefits and other options for treatment, the patient has consented to  Procedure(s): POST PARTUM TUBAL LIGATION (N/A) as a surgical intervention .  The patient's history has been reviewed, patient examined, no change in status, stable for surgery.  Pt has the following beta blocker history-  Not taking Beta Blocker.  I have reviewed the patient's chart and labs.  Questions were answered to the patient's satisfaction.    [name]

## 2016-07-03 NOTE — Progress Notes (Signed)
Post Partum Day 1  Subjective: Doing well, no complaints.  Tolerating regular diet, pain with PO meds, voiding and ambulating without difficulty. Pt is currently NPO awaiting bilateral tubal ligation. Consent form including how procedure is performed, risks and benefits of procedure and recovery following procedure were reviewed with patient and questions answered. Pt verbalizes understanding and states that she will sign consent with RN this am.   No CP SOB F/C N/V or leg pain No HA, change of vision, RUQ/epigastric pain  Objective: BP 97/63 (BP Location: Left Arm)   Pulse 78   Temp 98.6 F (37 C) (Oral)   Resp 18   Ht 5' (1.524 m)   Wt 130 lb (59 kg)   LMP 08/23/2015   SpO2 99%   Breastfeeding   BMI 25.39 kg/m   Physical Exam:  General: NAD CV: RRR Pulm: nl effort, CTABL Lochia: moderate Uterine Fundus: fundus firm and below umbilicus DVT Evaluation: no cords, ttp LEs    Recent Labs  07/02/16 0852 07/03/16 0436  HGB 12.4 10.9*  HCT 36.4 31.1*  WBC 15.4* 17.2*  PLT 322 302    Assessment/Plan: 35 y.o. G2P2001 postpartum day # 1  1. NPO prior to BTL 2. A+, Rubella non-immune, Varicella immune 3. TDAP UTD 4. Breastfeeding/BTL 5. Disposition: discharge to home tomorrow   Tresea MallGLEDHILL,Jeena Arnett, CNM

## 2016-07-04 ENCOUNTER — Ambulatory Visit: Payer: Self-pay

## 2016-07-04 MED ORDER — ONDANSETRON HCL 4 MG PO TABS: 4.0000 mg | ORAL_TABLET | Freq: Three times a day (TID) | ORAL | 1 refills | Status: DC | PRN

## 2016-07-04 MED ORDER — DOCUSATE SODIUM 100 MG PO CAPS: 100.0000 mg | ORAL_CAPSULE | Freq: Two times a day (BID) | ORAL | 0 refills | Status: DC

## 2016-07-04 MED ORDER — VARICELLA VIRUS VACCINE LIVE 1350 PFU/0.5ML IJ SUSR
0.5000 mL | Freq: Once | INTRAMUSCULAR | Status: DC
  Filled 2016-07-04: qty 0.5

## 2016-07-04 MED ORDER — HYDROMORPHONE HCL 2 MG PO TABS: 2.0000 mg | ORAL_TABLET | ORAL | 0 refills | Status: DC | PRN

## 2016-07-04 NOTE — Progress Notes (Addendum)
Pt seen this am and is awaiting discharge to home. She has been seen by Dr Tiburcio PeaHarris. The pediatrician has discharged the baby. [facility]. Pt requests Rx for zofran and dilaudid per her discussion with Dr Tiburcio PeaHarris. Pt is varicella not immune per her records and rubella immune. She declines varicella vaccine. RN is working on discharge now.  Tresea MallGLEDHILL,Xavia Kniskern, CNM

## 2016-07-04 NOTE — Addendum Note (Signed)
Addendum  created 07/04/16 0751 by Henrietta HooverKimberly Sophia Cubero, CRNA   Sign clinical note

## 2016-07-04 NOTE — Lactation Note (Signed)
This note was copied from a baby's chart. Lactation Consultation Note  Patient Name: Frances Sanders [id number]'s Date: 07/04/2016 Reason for consult: Follow-up assessment;Infant weight loss   Maternal Data Formula Feeding for Exclusion: No Pt spoken to about breast pump rental, pt desires to purchase a pump elsewhere and then [facility], encouraged frequent feeds offering both breasts each feeding, breasts starting to change in fullness, pt also shown how to use manual Medela pump.  feeding Feeding Type: Breast Fed Length of feed: 30 min  LATCH Score/Interventions Latch: Grasps breast easily, tongue down, lips flanged, rhythmical sucking.  Audible Swallowing: Spontaneous and intermittent  Type of Nipple: Everted at rest and after stimulation  Comfort (Breast/Nipple): Soft / non-tender     Hold (Positioning): No assistance needed to correctly position infant at breast.  LATCH Score: 10  Lactation Tools Discussed/[facility] (wants to sign up for WIC, [facility]andy [facility])   Consult Status Consult Status: Complete    Dyann KiefMarsha D Lavonne Cass 07/04/2016, 12:26 PM

## 2016-07-04 NOTE — Anesthesia Postprocedure Evaluation (Signed)
Anesthesia Post Note  Patient: Frances RenStephanie B Butzin  Procedure(s) Performed: * No procedures listed *  Patient location during evaluation: Mother Baby Anesthesia Type: Epidural Level of consciousness: awake, awake and alert and oriented Pain management: pain level controlled Vital Signs Assessment: post-procedure vital signs reviewed and stable Respiratory status: spontaneous breathing and respiratory function stable Cardiovascular status: blood pressure returned to baseline Postop Assessment: no headache Anesthetic complications: no Comments: Moving feet and legs, no complaints Had BTL yesterday    Last Vitals:  Vitals:   07/03/16 2246 07/04/16 0451  BP: (!) 97/55 (!) 97/58  Pulse: 87 76  Resp: 19 20  Temp: 36.6 C 36.6 C    Last Pain:  Vitals:   07/04/16 0600  TempSrc:   PainSc: 5                  Vernie MurdersPope,  Maudell Stanbrough G

## 2016-07-04 NOTE — Progress Notes (Signed)
Post Partum Day 2  Subjective: Doing well, no complaints.  Tolerating regular diet, pain with PO meds, voiding and ambulating without difficulty. Mild-mod inc pain from PP BTL. No CP SOB F/C N/V or leg pain No HA, change of vision, RUQ/epigastric pain  Objective: BP 97/63 (BP Location: Left Arm)   Pulse 78   Temp 98.6 F (37 C) (Oral)   Resp 18   Ht 5' (1.524 m)   Wt 130 lb (59 kg)   LMP 08/23/2015   SpO2 99%   Breastfeeding   BMI 25.39 kg/m   Physical Exam:  General: NAD CV: RRR Pulm: nl effort, CTABL Lochia: moderate Uterine Fundus: fundus firm and below umbilicus DVT Evaluation: no cords, ttp LEs  INC- C/D/I  Recent Labs  07/02/16 0852 07/03/16 0436  HGB 12.4 10.9*  HCT 36.4 31.1*  WBC 15.4* 17.2*  PLT 322 302    Assessment/Plan: 35 y.o. G2P2001 postpartum day # 2  1. Dilaudid for pain 2. A+, Rubella non-immune, Varicella immune 3. TDAP UTD 4. Breastfeeding/BTL 5. Disposition: discharge to home   Letitia Libraobert Paul Yossef Gilkison, MD

## 2016-07-04 NOTE — Progress Notes (Signed)
Patient discharged home with infant and significant other. Discharge instructions, prescriptions and follow up appointment given to and reviewed with patient and significant other. Patient verbalized understanding. Escorted out via wheelchair by axiliary.  

## 2016-07-04 NOTE — Anesthesia Post-op Follow-up Note (Signed)
  Anesthesia Pain Follow-up Note  Patient: Frances Sanders  Day #: 2  Date of Follow-up: 07/04/2016 Time: 7:27 AM  Last Vitals:  Vitals:   07/03/16 2246 07/04/16 0451  BP: (!) 97/55 (!) 97/58  Pulse: 87 76  Resp: 19 20  Temp: 36.6 C 36.6 C    Level of Consciousness: alert  Pain: mild   Side Effects:None  Catheter Site Exam:clean, dry, no drainage     Plan: D/C from anesthesia care  Vernie MurdersPope,  Yaniyah Koors G

## 2016-07-05 NOTE — Anesthesia Postprocedure Evaluation (Signed)
Anesthesia Post Note  Patient: [name]  Procedure(s) Performed: Procedure(s) (LRB): POST PARTUM TUBAL LIGATION BY SALPINGECTOMY (Bilateral)  Patient location during evaluation: PACU Anesthesia Type: General Level of consciousness: awake and alert and oriented Pain management: pain level controlled Vital Signs Assessment: post-procedure vital signs reviewed and stable Respiratory status: spontaneous breathing Cardiovascular status: blood pressure returned to baseline Anesthetic complications: no    Last Vitals:  Vitals:   07/04/16 0818 07/04/16 1128  BP: 104/69   Pulse: 80   Resp: 18   Temp: 36.7 C 36.9 C    Last Pain:  Vitals:   07/04/16 1202  TempSrc:   PainSc: 6                  Janat Tabbert

## 2016-07-07 LAB — SURGICAL PATHOLOGY

## 2016-10-10 ENCOUNTER — Telehealth: Payer: Self-pay | Admitting: Obstetrics & Gynecology

## 2016-10-10 DIAGNOSIS — S3992XS Unspecified injury of lower back, sequela: Secondary | ICD-10-CM

## 2016-10-10 NOTE — Telephone Encounter (Signed)
Made order for referral

## 2016-10-10 NOTE — Telephone Encounter (Signed)
Pt is calling today needing an referral for an Chiropractor from her delivery when she hurt her tailbone. [facility] [place]. Cb# [phone]. Pt would like to be notified about this referral.

## 2016-11-06 ENCOUNTER — Telehealth: Payer: Self-pay

## 2016-11-06 NOTE — Telephone Encounter (Signed)
Pt calling, dlvd 06/2016, breastfeeding, right breast not producing much at all like it used to.  Pediatrician told her there are rxs that can be called in.  Pt uses [facility].  Left detailed msg for pt to call lactation consultant at [facility] #s given and if they think she needs rx to call us back.

## 2017-01-05 ENCOUNTER — Telehealth: Payer: Self-pay | Admitting: Obstetrics & Gynecology

## 2017-01-05 NOTE — Telephone Encounter (Signed)
[facility] and [facility]

## 2017-01-05 NOTE — Telephone Encounter (Signed)
What do I need to do with this?

## 2017-01-05 NOTE — Telephone Encounter (Signed)
Approved.  

## 2017-01-05 NOTE — Telephone Encounter (Signed)
Patient has lost weight since the birth of her daughter.  WIC has encouraged her to drink Ensure.  WIC needs permission to give her Ensure.  Please approve and I will make sure the proper paperwork is completed [facility].

## 2017-09-30 ENCOUNTER — Ambulatory Visit: Payer: Self-pay | Admitting: Obstetrics & Gynecology

## 2017-10-06 ENCOUNTER — Encounter: Payer: Self-pay | Admitting: Obstetrics & Gynecology

## 2017-10-06 ENCOUNTER — Ambulatory Visit (INDEPENDENT_AMBULATORY_CARE_PROVIDER_SITE_OTHER): Payer: BLUE CROSS/BLUE SHIELD | Admitting: Obstetrics & Gynecology

## 2017-10-06 VITALS — BP 120/80 | HR 121 | Ht 60.0 in | Wt 100.0 lb

## 2017-10-06 DIAGNOSIS — F332 Major depressive disorder, recurrent severe without psychotic features: Secondary | ICD-10-CM | POA: Diagnosis not present

## 2017-10-06 DIAGNOSIS — Z0000 Encounter for general adult medical examination without abnormal findings: Secondary | ICD-10-CM | POA: Diagnosis not present

## 2017-10-06 DIAGNOSIS — F419 Anxiety disorder, unspecified: Secondary | ICD-10-CM | POA: Diagnosis not present

## 2017-10-06 MED ORDER — NORETHINDRONE 0.35 MG PO TABS: 1.0000 | ORAL_TABLET | Freq: Every day | ORAL | 11 refills | Status: DC

## 2017-10-06 NOTE — Addendum Note (Signed)
Addended by: Nadara MustardHARRIS, Reilly Molchan P on: 10/06/2017 08:56 AM   Modules accepted: Orders

## 2017-10-06 NOTE — Patient Instructions (Addendum)
PAP every three years  Norgestrel tablets (contraception) What is this medicine? NORGESTREL (nawr JES truhl) is an oral contraceptive (birth control pill). The product contains a female hormone known as a progestin. It is used to prevent pregnancy. NOTE: This drug is discontinued in the [place]. This medicine may be used for other purposes; ask your health care provider or pharmacist if you have questions. COMMON BRAND NAME(S): Ovrette What should I tell my health care provider before I take this medicine? They need to know if you have any of these conditions: -blood vessel disease or blood clots -breast, cervical, or vaginal cancer -diabetes -heart disease -kidney disease -liver disease -mental depression -migraine -seizures (convulsions) -stroke -vaginal bleeding -an unusual or allergic reaction to norethindrone, other medicines, foods, dyes, or preservatives -pregnant or trying to get pregnant -breast-feeding How should I use this medicine? Take this medicine by mouth. You may take it with or without food. Follow the directions on the prescription label. Take this medicine at the same time each day and in the order directed on the package. Do not take your medicine more often than directed. Contact your pediatrician regarding the use of this medicine in children. Special care may be needed. This medicine has been used in female children who have started having menstrual periods. A patient package insert for the product will be given with each prescription and refill. Read this sheet carefully each time. The sheet may change frequently. Overdosage: If you think you have taken too much of this medicine contact a poison control center or emergency room at once. NOTE: This medicine is only for you. Do not share this medicine with others. What if I miss a dose? Try not to miss a dose. Every time you miss a dose or take a dose late your chance of pregnancy increases. When 1 pill is  missed (even if only 3 hours late), take the missed pill as soon as possible and continue taking a pill each day at the regular time (use a back up method of birth control for the next 48 hours). If more than 1 dose is missed, use an additional birth control method for the rest of your pill pack until menses occurs. Contact your health care professional if more than 1 dose has been missed. What may interact with this medicine? -antibiotics or medicines for infections, especially rifampin, rifabutin, rifapentine, and griseofulvin, and possibly penicillins or tetracyclines -aprepitant -barbiturate medicines, such as phenobarbital -bosentan -carbamazepine -felbamate -modafinil -oxcarbazepine -phenytoin -ritonavir or other medicines for HIV infection or AIDS -St. John's wort -topiramate This list may not describe all possible interactions. Give your health care provider a list of all the medicines, herbs, non-prescription drugs, or dietary supplements you use. Also tell them if you smoke, drink alcohol, or use illegal drugs. Some items may interact with your medicine. What should I watch for while using this medicine? Visit your doctor or health care professional for regular checks on your progress. You will need a regular breast and pelvic exam and Pap smear while on this medicine. Use an additional method of birth control during the first cycle that you take these tablets. If you have any reason to think you are pregnant, stop taking this medicine right away and contact your doctor or health care professional. If you are taking this medicine for hormone related problems, it may take several cycles of use to see improvement in your condition. This medicine does not protect you against HIV infection (AIDS) or any other  sexually transmitted diseases. What side effects may I notice from receiving this medicine? Side effects that you should report to your doctor or health care professional as soon as  possible: -breast tenderness or discharge -pain in the abdomen, chest, groin or leg -severe headache -skin rash, itching, or hives -sudden shortness of breath -unusually weak or tired -vision or speech problems -yellowing of skin or eyes Side effects that usually do not require medical attention (report to your doctor or health care professional if they continue or are bothersome): -changes in sexual desire -change in menstrual flow -facial hair growth -fluid retention and swelling -headache -irritability -nausea -weight gain or loss This list may not describe all possible side effects. Call your doctor for medical advice about side effects. You may report side effects to FDA at 1-800-FDA-1088. Where should I keep my medicine? Keep out of the reach of children. Store at room temperature between 15 and 30 degrees C (59 and 86 degrees F). Throw away any unused medicine after the expiration date. NOTE: This sheet is a summary. It may not cover all possible information. If you have questions about this medicine, talk to your doctor, pharmacist, or health care provider.  2018 Elsevier/Gold Standard (2008-07-06 13:57:04)

## 2017-10-06 NOTE — Progress Notes (Signed)
HPI:      [name] is a 37 y.o. G2P2001 who LMP was Patient's last menstrual period was 09/29/2017., she presents today for her annual examination. The patient has no complaints today. The patient is sexually active. Her last pap: approximate date 2018 and was normal. The patient does perform self breast exams.  There is no notable family history of breast or ovarian cancer in her family.  The patient has regular exercise: yes.  The patient denies current symptoms of depression buth as a lot of anxiety.  GYN History: Contraception: tubal ligation  PMHx: Past Medical History:  Diagnosis Date  . Anxiety   . Bipolar 1 disorder (HCC)   . Depression   . Insomnia    Past Surgical History:  Procedure Laterality Date  . APPENDECTOMY    . CESAREAN SECTION    . HERNIA REPAIR    . TUBAL LIGATION Bilateral 07/03/2016   Procedure: POST PARTUM TUBAL LIGATION BY SALPINGECTOMY;  Surgeon: Nadara Mustard, MD;  Location: [facility] ORS;  Service: Gynecology;  Laterality: Bilateral;   Family History  Problem Relation Age of Onset  . Diabetes Paternal Aunt   . Diabetes Paternal Uncle   . Cancer Maternal Grandmother   . Diabetes Maternal Grandmother   . Heart disease Maternal Grandmother   . Hypertension Maternal Grandmother   . Diabetes Maternal Grandfather    Social History   Tobacco Use  . Smoking status: Current Every Day Smoker    Packs/day: 0.50    Types: Cigarettes  . Smokeless tobacco: Never Used  Substance Use Topics  . Alcohol use: No  . Drug use: No    Current Outpatient Medications:  .  Brexpiprazole (REXULTI) 1 MG TABS, Take 2 mg by mouth every morning. (Patient not taking: Reported on 10/06/2017), Disp: 60 tablet, Rfl: 0 .  docusate sodium (COLACE) 100 MG capsule, Take 1 capsule (100 mg total) by mouth 2 (two) times daily. (Patient not taking: Reported on 10/06/2017), Disp: 10 capsule, Rfl: 0 .  escitalopram (LEXAPRO) 20 MG tablet, Take 1 tablet (20 mg total) by  mouth daily. (Patient not taking: Reported on 10/06/2017), Disp: 30 tablet, Rfl: 0 .  HYDROmorphone (DILAUDID) 2 MG tablet, Take 1-2 tablets (2-4 mg total) by mouth every 4 (four) hours as needed for moderate pain or severe pain. (Patient not taking: Reported on 10/06/2017), Disp: 40 tablet, Rfl: 0 .  LORazepam (ATIVAN) 1 MG tablet, Take 1 mg by mouth daily., Disp: , Rfl:  .  norethindrone (MICRONOR,CAMILA,ERRIN) 0.35 MG tablet, Take 1 tablet (0.35 mg total) by mouth daily., Disp: 1 Package, Rfl: 11 .  ondansetron (ZOFRAN) 4 MG tablet, Take 1 tablet (4 mg total) by mouth every 8 (eight) hours as needed for nausea or vomiting. (Patient not taking: Reported on 10/06/2017), Disp: 20 tablet, Rfl: 1 .  Prenatal Vit-Fe Fumarate-FA (MULTIVITAMIN-PRENATAL) 27-0.8 MG TABS tablet, Take 1 tablet by mouth daily at 12 noon., Disp: , Rfl:  .  promethazine (PHENERGAN) 25 MG tablet, Take 1 tablet (25 mg total) by mouth every 6 (six) hours as needed for nausea or vomiting. (Patient not taking: Reported on 10/06/2017), Disp: 20 tablet, Rfl: 0 .  traZODone (DESYREL) 150 MG tablet, Take 1 tablet (150 mg total) by mouth at bedtime. (Patient not taking: Reported on 10/06/2017), Disp: 30 tablet, Rfl: 0 .  zolpidem (AMBIEN) 5 MG tablet, Take 1 tablet (5 mg total) by mouth at bedtime as needed for sleep. (Patient not taking: Reported on  10/06/2017), Disp: 30 tablet, Rfl: 1 Allergies: Patient has no known allergies.  Review of Systems  Constitutional: Negative for chills, fever and malaise/fatigue.  HENT: Negative for congestion, sinus pain and sore throat.   Eyes: Negative for blurred vision and pain.  Respiratory: Negative for cough and wheezing.   Cardiovascular: Negative for chest pain and leg swelling.  Gastrointestinal: Negative for abdominal pain, constipation, diarrhea, heartburn, nausea and vomiting.  Genitourinary: Negative for dysuria, frequency, hematuria and urgency.  Musculoskeletal: Negative for back pain, joint pain,  myalgias and neck pain.  Skin: Negative for itching and rash.  Neurological: Negative for dizziness, tremors and weakness.  Endo/Heme/Allergies: Does not bruise/bleed easily.  Psychiatric/Behavioral: Negative for depression. The patient is not nervous/anxious and does not have insomnia.     Objective: BP 120/80   Pulse (!) 121   Ht 5' (1.524 m)   Wt 100 lb (45.4 kg)   LMP 09/29/2017   BMI 19.53 kg/m   Filed Weights   10/06/17 0811  Weight: 100 lb (45.4 kg)   Body mass index is 19.53 kg/m. Physical Exam  Constitutional: She is oriented to person, place, and time. She appears well-developed and well-nourished. No distress.  Genitourinary: Rectum normal, vagina normal and uterus normal. Pelvic exam was performed with patient supine. There is no rash or lesion on the right labia. There is no rash or lesion on the left labia. Vagina exhibits no lesion. No bleeding in the vagina. Right adnexum does not display mass and does not display tenderness. Left adnexum does not display mass and does not display tenderness. Cervix does not exhibit motion tenderness, lesion, friability or polyp.   Uterus is mobile and midaxial. Uterus is not enlarged or exhibiting a mass.  HENT:  Head: Normocephalic and atraumatic. Head is without laceration.  Right Ear: Hearing normal.  Left Ear: Hearing normal.  Nose: No epistaxis.  No foreign bodies.  Mouth/Throat: Uvula is midline, oropharynx is clear and moist and mucous membranes are normal.  Eyes: Pupils are equal, round, and reactive to light.  Neck: Normal range of motion. Neck supple. No thyromegaly present.  Cardiovascular: Normal rate and regular rhythm. Exam reveals no gallop and no friction rub.  No murmur heard. Pulmonary/Chest: Effort normal and breath sounds normal. No respiratory distress. She has no wheezes. Right breast exhibits no mass, no skin change and no tenderness. Left breast exhibits no mass, no skin change and no tenderness.    Abdominal: Soft. Bowel sounds are normal. She exhibits no distension. There is no tenderness. There is no rebound.  Musculoskeletal: Normal range of motion.  Neurological: She is alert and oriented to person, place, and time. No cranial nerve deficit.  Skin: Skin is warm and dry.  Psychiatric: She has a normal mood and affect. Judgment normal.  Vitals reviewed.  Assessment:  ANNUAL EXAM 1. Annual physical exam   2. Anxiety   3. 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  Cervical Screening-  Pap smear schedule reviewed with patient  2. Labs managed by PCP  3. Counseling for contraception: bilateral tubal ligation   4. Anxiety.  Periods reg.  Pt desires OCP to help w mood.  Can try prog only as she is smoker and soon to be 37 yo.  Cont counseling.    F/U  Return in about 1 year (around 10/07/2018) for Annual.  Annamarie MajorPaul Shrika Milos, MD, [facility],  [facility] 10/06/2017  8:50 AM

## 2017-10-13 ENCOUNTER — Encounter: Payer: Self-pay | Admitting: Obstetrics and Gynecology

## 2017-10-16 ENCOUNTER — Encounter: Payer: Self-pay | Admitting: Obstetrics & Gynecology

## 2017-10-16 ENCOUNTER — Telehealth: Payer: Self-pay | Admitting: Obstetrics & Gynecology

## 2017-10-16 NOTE — Telephone Encounter (Signed)
Please advise pt on lab results

## 2017-10-16 NOTE — Telephone Encounter (Signed)
-----   Message from La LuzMychart, Generic sent at 10/16/2017 11:22 AM EDT -----    Appointment Request From: Malissa HippoStephanie E Meara    With Provider: [name], MD [[facility]]    Preferred Date Range: 11/09/2017 - 11/11/2017    Preferred Times: Monday Morning, Monday Afternoon, Tuesday Morning, Tuesday Afternoon, Wednesday Morning, Wednesday Afternoon    Reason for visit: Request an Appointment    Comments:  Overdue for Tetanus. I can do between 10AM-2AM   Pt was seen at out office on 10/06/2017 for annual. Please advise

## 2017-10-19 NOTE — Telephone Encounter (Signed)
Schedule for injection appt w nurse for TDaP

## 2017-10-19 NOTE — Telephone Encounter (Signed)
Called and left voice mail for patient to call back to be schedule °

## 2017-10-19 NOTE — Telephone Encounter (Signed)
Let her know we have not ordered any labs recently for her.

## 2017-10-20 ENCOUNTER — Ambulatory Visit: Payer: BLUE CROSS/BLUE SHIELD

## 2017-10-20 DIAGNOSIS — Z7189 Other specified counseling: Secondary | ICD-10-CM

## 2017-10-20 NOTE — Progress Notes (Signed)
Pt on nurse schedule today, thought she was due for TDAP, but she had one in 2017 with pregnancy. Pt was scheduled for Flu shot as well but declined it.

## 2017-10-22 ENCOUNTER — Encounter: Payer: Self-pay | Admitting: Obstetrics & Gynecology

## 2017-10-29 ENCOUNTER — Encounter: Payer: Self-pay | Admitting: Obstetrics & Gynecology

## 2017-10-29 ENCOUNTER — Other Ambulatory Visit: Payer: Self-pay | Admitting: Obstetrics & Gynecology

## 2017-10-29 MED ORDER — FLUCONAZOLE 150 MG PO TABS: 150.0000 mg | ORAL_TABLET | Freq: Once | ORAL | 1 refills | Status: AC

## 2018-05-21 ENCOUNTER — Encounter: Payer: Self-pay | Admitting: Obstetrics & Gynecology

## 2018-05-25 ENCOUNTER — Encounter: Payer: Self-pay | Admitting: Obstetrics & Gynecology

## 2018-05-27 ENCOUNTER — Ambulatory Visit (INDEPENDENT_AMBULATORY_CARE_PROVIDER_SITE_OTHER): Payer: Medicare Other | Admitting: Obstetrics & Gynecology

## 2018-05-27 ENCOUNTER — Encounter: Payer: Self-pay | Admitting: Obstetrics & Gynecology

## 2018-05-27 VITALS — BP 120/80 | Ht 60.0 in | Wt 112.0 lb

## 2018-05-27 DIAGNOSIS — N943 Premenstrual tension syndrome: Secondary | ICD-10-CM

## 2018-05-27 DIAGNOSIS — N926 Irregular menstruation, unspecified: Secondary | ICD-10-CM | POA: Diagnosis not present

## 2018-05-27 MED ORDER — MEDROXYPROGESTERONE ACETATE 150 MG/ML IM SUSP: 150.0000 mg | INTRAMUSCULAR | 0 refills | Status: DC

## 2018-05-27 NOTE — Progress Notes (Signed)
HPI:      [name] is a 37 y.o. [id number] who LMP was Patient's last menstrual period was 05/12/2018., presents today for a problem visit.  She complains of irreg cycles, somewhat better with norethinedrone but desires something more.  Also hormoness seem to help w mood and PMS.  Has had BTL. Stress at home right now.  PMHx: She  has a past medical history of Anxiety, Bipolar 1 disorder (HCC), Depression, and Insomnia. Also,  has a past surgical history that includes Appendectomy; Hernia repair; Cesarean section; and Tubal ligation (Bilateral, 07/03/2016)., family history includes Colon cancer in her maternal grandmother; Diabetes in her maternal grandfather, maternal grandmother, paternal aunt, and paternal uncle; Heart disease in her maternal grandmother; Hypertension in her maternal grandmother; Ovarian cancer (age of onset: 68) in her cousin; Uterine cancer in her cousin.,  reports that she has been smoking cigarettes. She has been smoking about 0.50 packs per day. She has never used smokeless tobacco. She reports that she does not drink alcohol or use drugs.  She  Current Outpatient Medications:  .  Brexpiprazole (REXULTI) 1 MG TABS, Take 2 mg by mouth every morning. (Patient not taking: Reported on 10/06/2017), Disp: 60 tablet, Rfl: 0 .  docusate sodium (COLACE) 100 MG capsule, Take 1 capsule (100 mg total) by mouth 2 (two) times daily. (Patient not taking: Reported on 10/06/2017), Disp: 10 capsule, Rfl: 0 .  escitalopram (LEXAPRO) 20 MG tablet, Take 1 tablet (20 mg total) by mouth daily. (Patient not taking: Reported on 10/06/2017), Disp: 30 tablet, Rfl: 0 .  HYDROmorphone (DILAUDID) 2 MG tablet, Take 1-2 tablets (2-4 mg total) by mouth every 4 (four) hours as needed for moderate pain or severe pain. (Patient not taking: Reported on 10/06/2017), Disp: 40 tablet, Rfl: 0 .  LORazepam (ATIVAN) 1 MG tablet, Take 1 mg by mouth daily., Disp: , Rfl:  .  medroxyPROGESTERone (DEPO-PROVERA) 150  MG/ML injection, Inject 1 mL (150 mg total) into the muscle every 3 (three) months., Disp: 1 mL, Rfl: 0 .  ondansetron (ZOFRAN) 4 MG tablet, Take 1 tablet (4 mg total) by mouth every 8 (eight) hours as needed for nausea or vomiting. (Patient not taking: Reported on 10/06/2017), Disp: 20 tablet, Rfl: 1 .  Prenatal Vit-Fe Fumarate-FA (MULTIVITAMIN-PRENATAL) 27-0.8 MG TABS tablet, Take 1 tablet by mouth daily at 12 noon., Disp: , Rfl:  .  promethazine (PHENERGAN) 25 MG tablet, Take 1 tablet (25 mg total) by mouth every 6 (six) hours as needed for nausea or vomiting. (Patient not taking: Reported on 10/06/2017), Disp: 20 tablet, Rfl: 0 .  traZODone (DESYREL) 150 MG tablet, Take 1 tablet (150 mg total) by mouth at bedtime. (Patient not taking: Reported on 10/06/2017), Disp: 30 tablet, Rfl: 0 .  zolpidem (AMBIEN) 5 MG tablet, Take 1 tablet (5 mg total) by mouth at bedtime as needed for sleep. (Patient not taking: Reported on 10/06/2017), Disp: 30 tablet, Rfl: 1  Also, has No Known Allergies.  Review of Systems  All other systems reviewed and are negative.  Objective: BP 120/80   Ht 5' (1.524 m)   Wt 112 lb (50.8 kg)   LMP 05/12/2018   BMI 21.87 kg/m  Physical Exam  Constitutional: She is oriented to person, place, and time. She appears well-developed and well-nourished. No distress.  Musculoskeletal: Normal range of motion.  Neurological: She is alert and oriented to person, place, and time.  Skin: Skin is warm and dry.  Psychiatric: She has a  normal mood and affect.  Vitals reviewed.   ASSESSMENT/PLAN:  Irregular menses    -  Primary   PMS (premenstrual syndrome)        Change to Depo Provera    Rx sent, shot later today  Birth Control I discussed multiple birth control options and methods with the patient.  The risks and benefits of each were reviewed.  The possible side effects including deep venous thrombosis, breast tenderness, fluid retention, mood changes and abnormal vaginal bleeding  were discussed.  Combination as well as progesterone-only options, pros and cons counseled.  A total of 15 minutes were spent face-to-face with the patient during this encounter and over half of that time dealt with counseling and coordination of care.  Annamarie Major, MD, [facility], [facility] 05/27/2018  11:25 AM

## 2018-05-28 ENCOUNTER — Ambulatory Visit (INDEPENDENT_AMBULATORY_CARE_PROVIDER_SITE_OTHER): Payer: Medicare Other

## 2018-05-28 ENCOUNTER — Encounter: Payer: Self-pay | Admitting: Obstetrics & Gynecology

## 2018-05-28 DIAGNOSIS — Z3042 Encounter for surveillance of injectable contraceptive: Secondary | ICD-10-CM | POA: Diagnosis not present

## 2018-05-28 MED ORDER — MEDROXYPROGESTERONE ACETATE 150 MG/ML IM SUSP
150.0000 mg | Freq: Once | INTRAMUSCULAR | Status: AC
  Administered 2018-05-28: 150 mg via INTRAMUSCULAR

## 2018-08-12 ENCOUNTER — Ambulatory Visit (INDEPENDENT_AMBULATORY_CARE_PROVIDER_SITE_OTHER): Payer: Medicare Other | Admitting: Obstetrics & Gynecology

## 2018-08-12 ENCOUNTER — Encounter: Payer: Self-pay | Admitting: Obstetrics & Gynecology

## 2018-08-12 VITALS — BP 120/80 | Ht 60.0 in | Wt 100.0 lb

## 2018-08-12 DIAGNOSIS — N926 Irregular menstruation, unspecified: Secondary | ICD-10-CM

## 2018-08-12 DIAGNOSIS — L918 Other hypertrophic disorders of the skin: Secondary | ICD-10-CM | POA: Diagnosis not present

## 2018-08-12 MED ORDER — MEDROXYPROGESTERONE ACETATE 150 MG/ML IM SUSP: 150.0000 mg | INTRAMUSCULAR | 3 refills | Status: DC

## 2018-08-12 NOTE — Progress Notes (Signed)
  History of Present Illness:  Frances Sanders is a 38 y.o. who was started on  . medroxyPROGESTERone (DEPO-PROVERA) 150 MG/ML injection Inject 1 mL (150 mg total) into the muscle every 3 (three) months.   approximately 3 months ago. Since that time, she states that her symptoms are improving.  Some irreg dark bleeding, none this month, and it has been sporadic.  Also reports concerns w skin tags especially beside right eye but also inner thigh and elbow.  PMHx: She  has a past medical history of Anxiety, Bipolar 1 disorder (HCC), Depression, and Insomnia. Also,  has a past surgical history that includes Appendectomy; Hernia repair; Cesarean section; and Tubal ligation (Bilateral, 07/03/2016)., family history includes Colon cancer in her maternal grandmother; Diabetes in her maternal grandfather, maternal grandmother, paternal aunt, and paternal uncle; Heart disease in her maternal grandmother; Hypertension in her maternal grandmother; Ovarian cancer (age of onset: 52) in her cousin; Uterine cancer in her cousin.,  reports that she has been smoking cigarettes. She has been smoking about 0.50 packs per day. She has never used smokeless tobacco. She reports that she does not drink alcohol or use drugs. Current Meds  Medication Sig  . diazepam (VALIUM) 5 MG tablet Take 5 mg by mouth every 6 (six) hours as needed for anxiety.  . medroxyPROGESTERone (DEPO-PROVERA) 150 MG/ML injection Inject 1 mL (150 mg total) into the muscle every 3 (three) months.  . [DISCONTINUED] medroxyPROGESTERone (DEPO-PROVERA) 150 MG/ML injection Inject 1 mL (150 mg total) into the muscle every 3 (three) months.  . Also, has No Known Allergies..  Review of Systems  All other systems reviewed and are negative.  Physical Exam:  BP 120/80   Ht 5' (1.524 m)   Wt 100 lb (45.4 kg)   LMP 05/10/2018   BMI 19.53 kg/m  Body mass index is 19.53 kg/m. Constitutional: Well nourished, well developed female in no acute distress.   Abdomen: diffusely non tender to palpation, non distended, and no masses, hernias Neuro: Grossly intact Psych:  Normal mood and affect.    Assessment:   Irregular menses    -  Primary   Skin tag       Relevant Orders   Ambulatory referral to Dermatology     Medication treatment is going well for her irreg bleeding w first dose of Depo; to continue.  Plan: She will undergo no change in her medical therapy.  She was amenable to this plan and we will see her back for annual/PRN.  A total of 15 minutes were spent face-to-face with the patient during this encounter and over half of that time dealt with counseling and coordination of care.  Annamarie Major, MD, [facility], [facility] 08/12/2018  10:07 AM

## 2018-08-13 ENCOUNTER — Ambulatory Visit: Payer: Medicare Other

## 2018-08-13 ENCOUNTER — Ambulatory Visit (INDEPENDENT_AMBULATORY_CARE_PROVIDER_SITE_OTHER): Payer: Medicare Other

## 2018-08-13 DIAGNOSIS — Z3042 Encounter for surveillance of injectable contraceptive: Secondary | ICD-10-CM

## 2018-08-13 MED ORDER — MEDROXYPROGESTERONE ACETATE 150 MG/ML IM SUSP
150.0000 mg | Freq: Once | INTRAMUSCULAR | Status: AC
  Administered 2018-08-13: 150 mg via INTRAMUSCULAR

## 2018-08-13 NOTE — Progress Notes (Signed)
Pt presents for Depo Provera Injection within dates. Given IM LUOQ.

## 2018-09-06 DIAGNOSIS — N926 Irregular menstruation, unspecified: Secondary | ICD-10-CM

## 2018-10-08 ENCOUNTER — Ambulatory Visit: Payer: Medicare Other | Admitting: Obstetrics & Gynecology

## 2018-10-14 ENCOUNTER — Ambulatory Visit: Payer: Medicare Other | Admitting: Obstetrics & Gynecology

## 2018-10-26 ENCOUNTER — Ambulatory Visit: Payer: Medicare Other | Admitting: Obstetrics & Gynecology

## 2018-11-05 ENCOUNTER — Ambulatory Visit: Payer: Medicare Other

## 2018-11-05 ENCOUNTER — Other Ambulatory Visit: Payer: Self-pay

## 2018-11-08 ENCOUNTER — Other Ambulatory Visit: Payer: Self-pay

## 2018-11-08 ENCOUNTER — Ambulatory Visit (INDEPENDENT_AMBULATORY_CARE_PROVIDER_SITE_OTHER): Payer: Medicaid Other

## 2018-11-08 DIAGNOSIS — Z3042 Encounter for surveillance of injectable contraceptive: Secondary | ICD-10-CM

## 2018-11-08 MED ORDER — MEDROXYPROGESTERONE ACETATE 150 MG/ML IM SUSP
150.0000 mg | Freq: Once | INTRAMUSCULAR | Status: AC
  Administered 2018-11-08: 150 mg via INTRAMUSCULAR

## 2018-11-08 NOTE — Progress Notes (Signed)
Patient presents for Depo Provera injection today within dates. Given IM RUOQ. Patient tolerated well.

## 2019-01-31 ENCOUNTER — Ambulatory Visit: Payer: Medicare Other

## 2019-02-01 ENCOUNTER — Ambulatory Visit: Payer: Medicare Other

## 2019-02-07 ENCOUNTER — Ambulatory Visit (INDEPENDENT_AMBULATORY_CARE_PROVIDER_SITE_OTHER): Payer: Medicaid Other

## 2019-02-07 ENCOUNTER — Other Ambulatory Visit: Payer: Self-pay

## 2019-02-07 DIAGNOSIS — Z3042 Encounter for surveillance of injectable contraceptive: Secondary | ICD-10-CM | POA: Diagnosis not present

## 2019-02-07 MED ORDER — MEDROXYPROGESTERONE ACETATE 150 MG/ML IM SUSP
150.0000 mg | Freq: Once | INTRAMUSCULAR | Status: AC
  Administered 2019-02-07: 150 mg via INTRAMUSCULAR

## 2019-05-24 ENCOUNTER — Telehealth: Payer: Self-pay | Admitting: Obstetrics & Gynecology

## 2019-05-24 ENCOUNTER — Other Ambulatory Visit: Payer: Self-pay

## 2019-05-24 ENCOUNTER — Ambulatory Visit (INDEPENDENT_AMBULATORY_CARE_PROVIDER_SITE_OTHER): Payer: Medicare Other

## 2019-05-24 DIAGNOSIS — Z3202 Encounter for pregnancy test, result negative: Secondary | ICD-10-CM | POA: Diagnosis not present

## 2019-05-24 DIAGNOSIS — N912 Amenorrhea, unspecified: Secondary | ICD-10-CM

## 2019-05-24 DIAGNOSIS — Z3042 Encounter for surveillance of injectable contraceptive: Secondary | ICD-10-CM | POA: Diagnosis not present

## 2019-05-24 LAB — POCT URINE PREGNANCY: Preg Test, Ur: NEGATIVE

## 2019-05-24 MED ORDER — MEDROXYPROGESTERONE ACETATE 150 MG/ML IM SUSP
150.0000 mg | Freq: Once | INTRAMUSCULAR | Status: AC
  Administered 2019-05-24: 150 mg via INTRAMUSCULAR

## 2019-05-24 NOTE — Progress Notes (Signed)
Patient presents today for Depo Provera injection outside of date range. Patient not currently on menses. Pregnancy test performed per protocol (negative).Given IM RUOQ. Patient tolerated well. 

## 2019-05-24 NOTE — Telephone Encounter (Signed)
Patient is scheduled for annual/depo with [facility] 08/18/2019.  She will need refill on depo for that visit.  Preferred pharmacy [facility].

## 2019-05-25 ENCOUNTER — Other Ambulatory Visit: Payer: Self-pay

## 2019-05-25 MED ORDER — MEDROXYPROGESTERONE ACETATE 150 MG/ML IM SUSP: 150.0000 mg | INTRAMUSCULAR | 3 refills | Status: DC

## 2019-05-25 NOTE — Telephone Encounter (Signed)
done

## 2019-08-18 ENCOUNTER — Ambulatory Visit: Payer: Medicare Other | Attending: Internal Medicine

## 2019-08-18 ENCOUNTER — Ambulatory Visit: Payer: Medicare Other | Admitting: Obstetrics & Gynecology

## 2019-08-18 DIAGNOSIS — Z20822 Contact with and (suspected) exposure to covid-19: Secondary | ICD-10-CM | POA: Diagnosis not present

## 2019-08-19 ENCOUNTER — Ambulatory Visit: Payer: Medicare Other | Admitting: Obstetrics & Gynecology

## 2019-08-19 LAB — NOVEL CORONAVIRUS, NAA: SARS-CoV-2, NAA: NOT DETECTED

## 2019-08-25 ENCOUNTER — Other Ambulatory Visit: Payer: Self-pay

## 2019-08-25 ENCOUNTER — Telehealth: Payer: Self-pay | Admitting: Obstetrics & Gynecology

## 2019-08-25 MED ORDER — MEDROXYPROGESTERONE ACETATE 150 MG/ML IM SUSP: 150.0000 mg | INTRAMUSCULAR | 0 refills | Status: DC

## 2019-08-25 NOTE — Telephone Encounter (Signed)
Rx done. 

## 2019-08-25 NOTE — Telephone Encounter (Signed)
Patient is callin stating she called her pharmacy there are refills there. Disregard request at this time

## 2019-08-25 NOTE — Telephone Encounter (Signed)
Patient is calling to get a refill on herdepo. Patient had to cancel last appointment due to covid exposure patient is schedule for depo on 08/31/19 and schedule for annual at the next available appointment with [facility] on 09/12/19 for annual. Please advise

## 2019-08-31 ENCOUNTER — Ambulatory Visit: Payer: Medicare Other

## 2019-09-12 ENCOUNTER — Ambulatory Visit (INDEPENDENT_AMBULATORY_CARE_PROVIDER_SITE_OTHER): Payer: Medicare Other | Admitting: Obstetrics & Gynecology

## 2019-09-12 ENCOUNTER — Encounter: Payer: Self-pay | Admitting: Obstetrics & Gynecology

## 2019-09-12 ENCOUNTER — Other Ambulatory Visit: Payer: Self-pay

## 2019-09-12 ENCOUNTER — Other Ambulatory Visit (HOSPITAL_COMMUNITY)
Admission: RE | Admit: 2019-09-12 | Discharge: 2019-09-12 | Disposition: A | Discharge status: Home / Self Care | Payer: Medicare Other | Source: Ambulatory Visit | Attending: Obstetrics & Gynecology | Admitting: Obstetrics & Gynecology

## 2019-09-12 ENCOUNTER — Telehealth: Payer: Self-pay

## 2019-09-12 VITALS — BP 120/80 | Ht 60.0 in | Wt 131.0 lb

## 2019-09-12 DIAGNOSIS — Z01411 Encounter for gynecological examination (general) (routine) with abnormal findings: Secondary | ICD-10-CM | POA: Diagnosis not present

## 2019-09-12 DIAGNOSIS — Z30430 Encounter for insertion of intrauterine contraceptive device: Secondary | ICD-10-CM

## 2019-09-12 DIAGNOSIS — Z131 Encounter for screening for diabetes mellitus: Secondary | ICD-10-CM

## 2019-09-12 DIAGNOSIS — Z01419 Encounter for gynecological examination (general) (routine) without abnormal findings: Secondary | ICD-10-CM

## 2019-09-12 DIAGNOSIS — Z124 Encounter for screening for malignant neoplasm of cervix: Secondary | ICD-10-CM

## 2019-09-12 DIAGNOSIS — Z1321 Encounter for screening for nutritional disorder: Secondary | ICD-10-CM

## 2019-09-12 DIAGNOSIS — Z13220 Encounter for screening for lipoid disorders: Secondary | ICD-10-CM

## 2019-09-12 DIAGNOSIS — Z1151 Encounter for screening for human papillomavirus (HPV): Secondary | ICD-10-CM | POA: Diagnosis not present

## 2019-09-12 DIAGNOSIS — F17210 Nicotine dependence, cigarettes, uncomplicated: Secondary | ICD-10-CM

## 2019-09-12 DIAGNOSIS — N946 Dysmenorrhea, unspecified: Secondary | ICD-10-CM | POA: Diagnosis not present

## 2019-09-12 DIAGNOSIS — Z1329 Encounter for screening for other suspected endocrine disorder: Secondary | ICD-10-CM

## 2019-09-12 NOTE — Progress Notes (Signed)
HPI:      [name] is a 39 y.o. [id number] who LMP was Patient's last menstrual period was 07/24/2019., she presents today for her annual examination. The patient has no complaints today other than WEIGHT GAIN 30 lbs on Remron, also Depo Provera.  Has BTL but desires to not have periods due to past pain and bleeding. The patient is sexually active. Her last pap: approximate date 2018 and was normal and she has h/o LEEP many years ago for dysplasia. The patient does perform self breast exams.  There is no notable family history of breast or ovarian cancer in her family.  The patient has regular exercise: yes.  The patient denies current symptoms of depression.    GYN History: Contraception: tubal ligation 2017  PMHx: Past Medical History:  Diagnosis Date  . Anxiety   . Bipolar 1 disorder (HCC)   . Depression   . Insomnia    Past Surgical History:  Procedure Laterality Date  . APPENDECTOMY    . CESAREAN SECTION    . HERNIA REPAIR    . TUBAL LIGATION Bilateral 07/03/2016   Procedure: POST PARTUM TUBAL LIGATION BY SALPINGECTOMY;  Surgeon: Nadara Mustard, MD;  Location: [facility] ORS;  Service: Gynecology;  Laterality: Bilateral;   Family History  Problem Relation Age of Onset  . Diabetes Paternal Aunt   . Diabetes Paternal Uncle   . Diabetes Maternal Grandmother   . Heart disease Maternal Grandmother   . Hypertension Maternal Grandmother   . Colon cancer Maternal Grandmother   . Diabetes Maternal Grandfather   . Ovarian cancer Cousin 25       ? accurate family history  . Uterine cancer Cousin    Social History   Tobacco Use  . Smoking status: Current Every Day Smoker    Packs/day: 0.50    Types: Cigarettes  . Smokeless tobacco: Never Used  Substance Use Topics  . Alcohol use: No  . Drug use: No    Current Outpatient Medications:  .  ARIPiprazole (ABILIFY) 10 MG tablet, Take 10 mg by mouth daily., Disp: , Rfl:  .  diazepam (VALIUM) 5 MG tablet, Take 5 mg by  mouth every 6 (six) hours as needed for anxiety., Disp: , Rfl:  .  mirtazapine (REMERON) 15 MG tablet, , Disp: , Rfl: 2 .  buPROPion (WELLBUTRIN SR) 150 MG 12 hr tablet, , Disp: , Rfl: 2 .  metFORMIN (GLUCOPHAGE) 500 MG tablet, , Disp: , Rfl:  Allergies: Patient has no known allergies.  Review of Systems  Constitutional: Negative for chills, fever and malaise/fatigue.  HENT: Negative for congestion, sinus pain and sore throat.   Eyes: Negative for blurred vision and pain.  Respiratory: Negative for cough and wheezing.   Cardiovascular: Negative for chest pain and leg swelling.  Gastrointestinal: Negative for abdominal pain, constipation, diarrhea, heartburn, nausea and vomiting.  Genitourinary: Negative for dysuria, frequency, hematuria and urgency.  Musculoskeletal: Negative for back pain, joint pain, myalgias and neck pain.  Skin: Negative for itching and rash.  Neurological: Negative for dizziness, tremors and weakness.  Endo/Heme/Allergies: Does not bruise/bleed easily.  Psychiatric/Behavioral: Negative for depression. The patient is not nervous/anxious and does not have insomnia.     Objective: BP 120/80   Ht 5' (1.524 m)   Wt 131 lb (59.4 kg)   LMP 07/24/2019   BMI 25.58 kg/m   Filed Weights   09/12/19 1351  Weight: 131 lb (59.4 kg)   Body mass index is  25.58 kg/m. Physical Exam Constitutional:      General: She is not in acute distress.    Appearance: She is well-developed.  Genitourinary:     Pelvic exam was performed with patient supine.     Vagina, uterus and rectum normal.     No lesions in the vagina.     No vaginal bleeding.     No cervical motion tenderness, friability, lesion or polyp.     Uterus is mobile.     Uterus is not enlarged.     No uterine mass detected.    Uterus is midaxial.     No right or left adnexal mass present.     Right adnexa not tender.     Left adnexa not tender.  HENT:     Head: Normocephalic and atraumatic. No laceration.      Right Ear: Hearing normal.     Left Ear: Hearing normal.     Mouth/Throat:     Pharynx: Uvula midline.  Eyes:     Pupils: Pupils are equal, round, and reactive to light.  Neck:     Thyroid: No thyromegaly.  Cardiovascular:     Rate and Rhythm: Normal rate and regular rhythm.     Heart sounds: No murmur. No friction rub. No gallop.   Pulmonary:     Effort: Pulmonary effort is normal. No respiratory distress.     Breath sounds: Normal breath sounds. No wheezing.  Chest:     Breasts:        Right: No mass, skin change or tenderness.        Left: No mass, skin change or tenderness.  Abdominal:     General: Bowel sounds are normal. There is no distension.     Palpations: Abdomen is soft.     Tenderness: There is no abdominal tenderness. There is no rebound.  Musculoskeletal:        General: Normal range of motion.     Cervical back: Normal range of motion and neck supple.  Neurological:     Mental Status: She is alert and oriented to person, place, and time.     Cranial Nerves: No cranial nerve deficit.  Skin:    General: Skin is warm and dry.  Psychiatric:        Judgment: Judgment normal.  Vitals reviewed.     Assessment:  ANNUAL EXAM 1. Women's annual routine gynecological examination   2. Screening for cervical cancer   3. Encounter for insertion of intrauterine contraceptive device (IUD)   4. Dysmenorrhea   5. Screening for cholesterol level   6. Screening for diabetes mellitus   7. Screening for thyroid disorder   8. Encounter for vitamin deficiency screening    Screening Plan:            1.  Cervical Screening-  Pap smear done today  2. Breast screening- Exam annually and mammogram>40 planned   3. Colonoscopy every 10 years, Hemoccult testing - after age 14  4. Labs To return fasting at a later date  5. Counseling for contraception: bilateral tubal ligation, Change fro Depo to IUD due to dysmenorrhea and not wanting periods (weight gain SE to Depo, is a  tobacco user and so OCP not advised)   IUD PROCEDURE NOTE:  Frances Sanders is a 40 y.o. [id number] here for IUD insertion. No GYN concerns.  Last pap smear was normal.  IUD Insertion Procedure Note Patient identified, informed consent performed, consent signed.   Discussed  risks of irregular bleeding, cramping, infection, malpositioning or misplacement of the IUD outside the uterus which may require further procedure such as laparoscopy, risk of failure <1%. Time out was performed.  Urine pregnancy test negative.  A bimanual exam showed the uterus to be midposition.  Speculum placed in the vagina.  Cervix visualized.  Cleaned with Betadine x 2.  Grasped anteriorly with a single tooth tenaculum.  Uterus sounded to 7 cm.   Mirena IUD placed per manufacturer's recommendations.  Strings trimmed to 3 cm. Tenaculum was removed, good hemostasis noted.  Patient tolerated procedure well.   Patient was given post-procedure instructions.  She was advised to have backup contraception for one week.  Patient was also asked to check IUD strings periodically and follow up in 4 weeks for IUD check.  Annamarie Major, MD, [facility], [facility] 09/12/2019  2:45 PM

## 2019-09-12 NOTE — Telephone Encounter (Signed)
Pt aware pap and blood work can be done.

## 2019-09-12 NOTE — Telephone Encounter (Signed)
Pt calling; has appt today c [facility]; only gets one yearly physical c pap and blood work; Dr Juel Burrow usually does her physical but not the pap.  Wants to know if Dr. Tiburcio Pea will do her pap and blood work today. [phone]  [facility]

## 2019-09-12 NOTE — Patient Instructions (Signed)
Intrauterine Device Insertion, Care After  This sheet gives you information about how to care for yourself after your procedure. Your health care provider may also give you more specific instructions. If you have problems or questions, contact your health care provider. What can I expect after the procedure? After the procedure, it is common to have:  Cramps and pain in the abdomen.  Light bleeding (spotting) or heavier bleeding that is like your menstrual period. This may last for up to a few days.  Lower back pain.  Dizziness.  Headaches.  Nausea. Follow these instructions at home:  Before resuming sexual activity, check to make sure that you can feel the IUD string(s). You should be able to feel the end of the string(s) below the opening of your cervix. If your IUD string is in place, you may resume sexual activity. ? If you had a hormonal IUD inserted more than 7 days after your most recent period started, you will need to use a backup method of birth control for 7 days after IUD insertion. Ask your health care provider whether this applies to you.  Continue to check that the IUD is still in place by feeling for the string(s) after every menstrual period, or once a month.  Take over-the-counter and prescription medicines only as told by your health care provider.  Do not drive or use heavy machinery while taking prescription pain medicine.  Keep all follow-up visits as told by your health care provider. This is important. Contact a health care provider if:  You have bleeding that is heavier or lasts longer than a normal menstrual cycle.  You have a fever.  You have cramps or abdominal pain that get worse or do not get better with medicine.  You develop abdominal pain that is new or is not in the same area of earlier cramping and pain.  You feel lightheaded or weak.  You have abnormal or bad-smelling discharge from your vagina.  You have pain during sexual  activity.  You have any of the following problems with your IUD string(s): ? The string bothers or hurts you or your sexual partner. ? You cannot feel the string. ? The string has gotten longer.  You can feel the IUD in your vagina.  You think you may be pregnant, or you miss your menstrual period.  You think you may have an STI (sexually transmitted infection). Get help right away if:  You have flu-like symptoms.  You have a fever and chills.  You can feel that your IUD has slipped out of place. Summary  After the procedure, it is common to have cramps and pain in the abdomen. It is also common to have light bleeding (spotting) or heavier bleeding that is like your menstrual period.  Continue to check that the IUD is still in place by feeling for the string(s) after every menstrual period, or once a month.  Keep all follow-up visits as told by your health care provider. This is important.  Contact your health care provider if you have problems with your IUD string(s), such as the string getting longer or bothering you or your sexual partner. This information is not intended to replace advice given to you by your health care provider. Make sure you discuss any questions you have with your health care provider. Document Revised: 07/03/2017 Document Reviewed: 06/11/2016 Elsevier Patient Education  2020 Elsevier Inc.  

## 2019-09-13 ENCOUNTER — Other Ambulatory Visit: Payer: Self-pay

## 2019-09-13 ENCOUNTER — Other Ambulatory Visit: Payer: Medicare Other

## 2019-09-13 DIAGNOSIS — Z1329 Encounter for screening for other suspected endocrine disorder: Secondary | ICD-10-CM

## 2019-09-13 DIAGNOSIS — Z1321 Encounter for screening for nutritional disorder: Secondary | ICD-10-CM | POA: Diagnosis not present

## 2019-09-13 DIAGNOSIS — Z13220 Encounter for screening for lipoid disorders: Secondary | ICD-10-CM

## 2019-09-13 DIAGNOSIS — Z131 Encounter for screening for diabetes mellitus: Secondary | ICD-10-CM | POA: Diagnosis not present

## 2019-09-14 LAB — VITAMIN D 25 HYDROXY (VIT D DEFICIENCY, FRACTURES): Vit D, 25-Hydroxy: 13.3 ng/mL — ABNORMAL LOW (ref 30.0–100.0)

## 2019-09-14 LAB — LIPID PANEL
Chol/HDL Ratio: 3.5 ratio (ref 0.0–4.4)
Cholesterol, Total: 166 mg/dL (ref 100–199)
HDL: 47 mg/dL (ref >39)
LDL Chol Calc (NIH): 91 mg/dL (ref 0–99)
Triglycerides: 159 mg/dL — ABNORMAL HIGH (ref 0–149)
VLDL Cholesterol Cal: 28 mg/dL (ref 5–40)

## 2019-09-14 LAB — TSH: TSH: 2.03 u[IU]/mL (ref 0.450–4.500)

## 2019-09-14 LAB — GLUCOSE, FASTING: Glucose, Plasma: 88 mg/dL (ref 65–99)

## 2019-09-15 ENCOUNTER — Other Ambulatory Visit: Payer: Self-pay | Admitting: Obstetrics & Gynecology

## 2019-09-15 DIAGNOSIS — E559 Vitamin D deficiency, unspecified: Secondary | ICD-10-CM

## 2019-09-15 LAB — CYTOLOGY - PAP
Comment: NEGATIVE
Diagnosis: NEGATIVE
High risk HPV: NEGATIVE

## 2019-09-15 MED ORDER — VITAMIN D (ERGOCALCIFEROL) 1.25 MG (50000 UNIT) PO CAPS: 50000.0000 [IU] | ORAL_CAPSULE | ORAL | 2 refills | Status: DC

## 2019-09-15 NOTE — Progress Notes (Signed)
Vitamin D deficiency documented, recommend supplementation first with Rx 50,000 U weekly for 3 months, then 1000 U daily OTC vit D.  Annamarie Major, MD, [facility], [facility] 09/15/2019  7:57 AM

## 2019-10-10 ENCOUNTER — Encounter: Payer: Self-pay | Admitting: Obstetrics & Gynecology

## 2019-10-10 ENCOUNTER — Other Ambulatory Visit: Payer: Self-pay

## 2019-10-10 ENCOUNTER — Ambulatory Visit (INDEPENDENT_AMBULATORY_CARE_PROVIDER_SITE_OTHER): Payer: Medicare Other | Admitting: Obstetrics & Gynecology

## 2019-10-10 VITALS — BP 120/80 | Ht 60.0 in | Wt 131.0 lb

## 2019-10-10 DIAGNOSIS — Z30431 Encounter for routine checking of intrauterine contraceptive device: Secondary | ICD-10-CM

## 2019-10-10 DIAGNOSIS — Z862 Personal history of diseases of the blood and blood-forming organs and certain disorders involving the immune mechanism: Secondary | ICD-10-CM

## 2019-10-10 DIAGNOSIS — E559 Vitamin D deficiency, unspecified: Secondary | ICD-10-CM

## 2019-10-10 NOTE — Progress Notes (Signed)
  History of Present Illness:  Frances Sanders is a 39 y.o. that had a Mirena IUD placed approximately 4 weeks ago. Since that time, she states that she has had no bleeding and pain  PMHx: She  has a past medical history of Anxiety, Bipolar 1 disorder (HCC), Depression, and Insomnia. Also,  has a past surgical history that includes Appendectomy; Hernia repair; Cesarean section; and Tubal ligation (Bilateral, 07/03/2016)., family history includes Colon cancer in her maternal grandmother; Diabetes in her maternal grandfather, maternal grandmother, paternal aunt, and paternal uncle; Heart disease in her maternal grandmother; Hypertension in her maternal grandmother; Ovarian cancer (age of onset: 76) in her cousin; Uterine cancer in her cousin.,  reports that she has been smoking cigarettes. She has been smoking about 0.50 packs per day. She has never used smokeless tobacco. She reports that she does not drink alcohol or use drugs. Current Meds  Medication Sig  . ARIPiprazole (ABILIFY) 10 MG tablet Take 10 mg by mouth daily.  Marland Kitchen buPROPion (WELLBUTRIN SR) 150 MG 12 hr tablet   . diazepam (VALIUM) 5 MG tablet Take 5 mg by mouth every 6 (six) hours as needed for anxiety.  . metFORMIN (GLUCOPHAGE) 500 MG tablet   . mirtazapine (REMERON) 15 MG tablet   . Vitamin D, Ergocalciferol, (DRISDOL) 1.25 MG (50000 UNIT) CAPS capsule Take 1 capsule (50,000 Units total) by mouth every 7 (seven) days. For 3 months.  .  Also, has No Known Allergies..  Review of Systems  All other systems reviewed and are negative.   Physical Exam:  BP 120/80   Ht 5' (1.524 m)   Wt 131 lb (59.4 kg)   BMI 25.58 kg/m  Body mass index is 25.58 kg/m. Constitutional: Well nourished, well developed female in no acute distress.  Abdomen: diffusely non tender to palpation, non distended, and no masses, hernias Neuro: Grossly intact Psych:  Normal mood and affect.    Pelvic exam:  Two IUD strings present seen coming from the  cervical os. EGBUS, vaginal vault and cervix: within normal limits  Assessment: IUD strings present in proper location; pt doing well  Plan: She was told to continue to use barrier contraception, in order to prevent any STIs, and to take a home pregnancy test or call us if she ever thinks she may be pregnant, and that her IUD expires in 6 years.  She was amenable to this plan and we will see her back in 1 year/PRN.  Check Vit D and Iron 2 mos, taking meds now for this  A total of 20 minutes were spent face-to-face with the patient as well as preparation, review, communication, and documentation during this encounter.   Annamarie Major, MD, [facility], [facility] 10/10/2019  4:15 PM

## 2019-10-26 DIAGNOSIS — M5412 Radiculopathy, cervical region: Secondary | ICD-10-CM | POA: Diagnosis not present

## 2019-10-26 DIAGNOSIS — M9902 Segmental and somatic dysfunction of thoracic region: Secondary | ICD-10-CM | POA: Diagnosis not present

## 2019-10-26 DIAGNOSIS — M546 Pain in thoracic spine: Secondary | ICD-10-CM | POA: Diagnosis not present

## 2019-10-26 DIAGNOSIS — M9901 Segmental and somatic dysfunction of cervical region: Secondary | ICD-10-CM | POA: Diagnosis not present

## 2019-11-03 DIAGNOSIS — M9902 Segmental and somatic dysfunction of thoracic region: Secondary | ICD-10-CM | POA: Diagnosis not present

## 2019-11-03 DIAGNOSIS — M9901 Segmental and somatic dysfunction of cervical region: Secondary | ICD-10-CM | POA: Diagnosis not present

## 2019-11-03 DIAGNOSIS — M5412 Radiculopathy, cervical region: Secondary | ICD-10-CM | POA: Diagnosis not present

## 2019-11-03 DIAGNOSIS — M546 Pain in thoracic spine: Secondary | ICD-10-CM | POA: Diagnosis not present

## 2019-11-10 ENCOUNTER — Other Ambulatory Visit: Payer: Self-pay | Admitting: Obstetrics & Gynecology

## 2019-11-24 ENCOUNTER — Other Ambulatory Visit: Payer: Self-pay | Admitting: Obstetrics & Gynecology

## 2019-11-24 DIAGNOSIS — E559 Vitamin D deficiency, unspecified: Secondary | ICD-10-CM

## 2019-11-24 MED ORDER — OMEPRAZOLE 20 MG PO CPDR: 20.0000 mg | DELAYED_RELEASE_CAPSULE | Freq: Every day | ORAL | 11 refills | Status: DC

## 2019-11-24 NOTE — Telephone Encounter (Signed)
Patient is schedule for 12/12/19 

## 2019-12-12 ENCOUNTER — Other Ambulatory Visit: Payer: Self-pay | Admitting: Obstetrics & Gynecology

## 2019-12-12 ENCOUNTER — Other Ambulatory Visit: Payer: Medicare Other

## 2019-12-12 DIAGNOSIS — Z1321 Encounter for screening for nutritional disorder: Secondary | ICD-10-CM

## 2019-12-13 ENCOUNTER — Other Ambulatory Visit: Payer: Medicare Other

## 2019-12-13 ENCOUNTER — Other Ambulatory Visit: Payer: Self-pay

## 2019-12-13 DIAGNOSIS — E559 Vitamin D deficiency, unspecified: Secondary | ICD-10-CM | POA: Diagnosis not present

## 2019-12-13 DIAGNOSIS — Z1321 Encounter for screening for nutritional disorder: Secondary | ICD-10-CM | POA: Diagnosis not present

## 2019-12-13 DIAGNOSIS — Z862 Personal history of diseases of the blood and blood-forming organs and certain disorders involving the immune mechanism: Secondary | ICD-10-CM

## 2019-12-14 LAB — VITAMIN D 25 HYDROXY (VIT D DEFICIENCY, FRACTURES): Vit D, 25-Hydroxy: 109 ng/mL — ABNORMAL HIGH (ref 30.0–100.0)

## 2019-12-14 LAB — VITAMIN B12: Vitamin B-12: 281 pg/mL (ref 232–1245)

## 2019-12-14 LAB — IRON: Iron: 112 ug/dL (ref 27–159)

## 2019-12-28 NOTE — Telephone Encounter (Signed)
Pt calling. Please call pt when you get a moment.

## 2019-12-29 NOTE — Telephone Encounter (Signed)
Pt aware.

## 2020-01-03 ENCOUNTER — Other Ambulatory Visit: Payer: Self-pay | Admitting: Obstetrics & Gynecology

## 2020-01-10 ENCOUNTER — Telehealth: Payer: Self-pay | Admitting: Obstetrics & Gynecology

## 2020-01-10 NOTE — Telephone Encounter (Signed)
Patient aware of scheduled work in

## 2020-01-10 NOTE — Telephone Encounter (Signed)
Patient is calling needing to have a face to face consult with Dr. Tiburcio Pea about some issues she is having. Patient is off work on Friday and wants to know if she can be worked into the schedule. Please advise. Patient doesn't want to be contacted thru Mychart

## 2020-01-10 NOTE — Telephone Encounter (Signed)
Sch 800 sharp  overbook

## 2020-01-12 ENCOUNTER — Other Ambulatory Visit: Payer: Self-pay | Admitting: Obstetrics & Gynecology

## 2020-01-13 ENCOUNTER — Ambulatory Visit (INDEPENDENT_AMBULATORY_CARE_PROVIDER_SITE_OTHER): Payer: Medicare Other | Admitting: Obstetrics & Gynecology

## 2020-01-13 ENCOUNTER — Other Ambulatory Visit: Payer: Self-pay

## 2020-01-13 ENCOUNTER — Encounter: Payer: Self-pay | Admitting: Obstetrics & Gynecology

## 2020-01-13 VITALS — BP 120/80 | Ht 60.0 in | Wt 111.0 lb

## 2020-01-13 DIAGNOSIS — F4320 Adjustment disorder, unspecified: Secondary | ICD-10-CM

## 2020-01-13 NOTE — Progress Notes (Signed)
  History of Present Illness:  Frances Sanders is a 39 y.o. who has been having worsening problems related to stress and mostly with family and neighbors, concerned for former psychiatric deterioriations and has been on meds in past; has been seen routinely at [facility] ([facility]) but feels her identity and privacy there has not been protected and wants a new venue.  She is stable now, she says, and denies SI, HI, severe depression, mania.  Also reports IUD doing well, rare bleeding.  PMHx: She  has a past medical history of Anxiety, Bipolar 1 disorder (HCC), Depression, and Insomnia. Also,  has a past surgical history that includes Appendectomy; Hernia repair; Cesarean section; and Tubal ligation (Bilateral, 07/03/2016)., family history includes Colon cancer in her maternal grandmother; Diabetes in her maternal grandfather, maternal grandmother, paternal aunt, and paternal uncle; Heart disease in her maternal grandmother; Hypertension in her maternal grandmother; Ovarian cancer (age of onset: 27) in her cousin; Uterine cancer in her cousin.,  reports that she has been smoking cigarettes. She has been smoking about 0.50 packs per day. She has never used smokeless tobacco. She reports that she does not drink alcohol and does not use drugs. Current Meds  Medication Sig  . diazepam (VALIUM) 5 MG tablet Take 5 mg by mouth every 6 (six) hours as needed for anxiety.  . metFORMIN (GLUCOPHAGE) 500 MG tablet   . mirtazapine (REMERON) 15 MG tablet   . omeprazole (PRILOSEC) 20 MG capsule Take 1 capsule (20 mg total) by mouth daily.  . Also, has No Known Allergies..  Review of Systems  All other systems reviewed and are negative.   Physical Exam:  BP 120/80   Ht 5' (1.524 m)   Wt 111 lb (50.3 kg)   BMI 21.68 kg/m  Body mass index is 21.68 kg/m. Constitutional: Well nourished, well developed female in no acute distress.  Abdomen: diffusely non tender to palpation, non distended, and no masses,  hernias Neuro: Grossly intact Psych:  Normal mood and affect.    Assessment:  Problem List Items Addressed This Visit    Adjustment disorder, unspecified type    -  Primary   Relevant Orders   Ambulatory referral to Psychiatry     Plan: She will undergo no change in her medical therapy.  Will refer to new psychiatry  She was amenable to this plan and we will see her back for annual/PRN.  A total of 30 minutes were spent face-to-face with the patient as well as preparation, review, communication, and documentation during this encounter.   Annamarie Major, MD, [facility], [facility] 01/13/2020  8:38 AM

## 2020-01-17 NOTE — Telephone Encounter (Signed)
Referal psychiatry

## 2020-01-18 NOTE — Telephone Encounter (Signed)
Has appt with Dr Elna Breslow at [facility] 8/23 @ 1:00

## 2020-01-31 ENCOUNTER — Other Ambulatory Visit: Payer: Self-pay

## 2020-01-31 ENCOUNTER — Emergency Department
Admission: EM | Admit: 2020-01-31 | Discharge: 2020-01-31 | Disposition: A | Discharge status: Home / Self Care | Payer: Medicare Other | Attending: Emergency Medicine | Admitting: Emergency Medicine

## 2020-01-31 DIAGNOSIS — L247 Irritant contact dermatitis due to plants, except food: Secondary | ICD-10-CM | POA: Diagnosis not present

## 2020-01-31 DIAGNOSIS — Z7984 Long term (current) use of oral hypoglycemic drugs: Secondary | ICD-10-CM | POA: Insufficient documentation

## 2020-01-31 DIAGNOSIS — L739 Follicular disorder, unspecified: Secondary | ICD-10-CM

## 2020-01-31 DIAGNOSIS — W57XXXA Bitten or stung by nonvenomous insect and other nonvenomous arthropods, initial encounter: Secondary | ICD-10-CM | POA: Insufficient documentation

## 2020-01-31 DIAGNOSIS — R109 Unspecified abdominal pain: Secondary | ICD-10-CM | POA: Diagnosis present

## 2020-01-31 DIAGNOSIS — R197 Diarrhea, unspecified: Secondary | ICD-10-CM | POA: Diagnosis not present

## 2020-01-31 DIAGNOSIS — Z3202 Encounter for pregnancy test, result negative: Secondary | ICD-10-CM | POA: Insufficient documentation

## 2020-01-31 DIAGNOSIS — S70362A Insect bite (nonvenomous), left thigh, initial encounter: Secondary | ICD-10-CM | POA: Diagnosis not present

## 2020-01-31 DIAGNOSIS — F17210 Nicotine dependence, cigarettes, uncomplicated: Secondary | ICD-10-CM | POA: Diagnosis not present

## 2020-01-31 DIAGNOSIS — E119 Type 2 diabetes mellitus without complications: Secondary | ICD-10-CM | POA: Diagnosis not present

## 2020-01-31 DIAGNOSIS — L237 Allergic contact dermatitis due to plants, except food: Secondary | ICD-10-CM | POA: Diagnosis not present

## 2020-01-31 HISTORY — DX: Type 2 diabetes mellitus without complications: E11.9

## 2020-01-31 HISTORY — DX: Gestational diabetes mellitus in pregnancy, unspecified control: O24.419

## 2020-01-31 LAB — PREGNANCY, URINE: Preg Test, Ur: NEGATIVE

## 2020-01-31 LAB — CBC
HCT: 41 % (ref 36.0–46.0)
Hemoglobin: 14.3 g/dL (ref 12.0–15.0)
MCH: 33.8 pg (ref 26.0–34.0)
MCHC: 34.9 g/dL (ref 30.0–36.0)
MCV: 96.9 fL (ref 80.0–100.0)
Platelets: 289 10*3/uL (ref 150–400)
RBC: 4.23 MIL/uL (ref 3.87–5.11)
RDW: 12.6 % (ref 11.5–15.5)
WBC: 6.8 10*3/uL (ref 4.0–10.5)
nRBC: 0 % (ref 0.0–0.2)

## 2020-01-31 LAB — URINALYSIS, COMPLETE (UACMP) WITH MICROSCOPIC
Bacteria, UA: NONE SEEN
Bilirubin Urine: NEGATIVE
Glucose, UA: NEGATIVE mg/dL
Hgb urine dipstick: NEGATIVE
Ketones, ur: NEGATIVE mg/dL
Leukocytes,Ua: NEGATIVE
Nitrite: NEGATIVE
Protein, ur: NEGATIVE mg/dL
Specific Gravity, Urine: 1.003 — ABNORMAL LOW (ref 1.005–1.030)
Squamous Epithelial / HPF: NONE SEEN (ref 0–5)
WBC, UA: NONE SEEN WBC/hpf (ref 0–5)
pH: 6 (ref 5.0–8.0)

## 2020-01-31 LAB — LIPASE, BLOOD: Lipase: 39 U/L (ref 11–51)

## 2020-01-31 LAB — COMPREHENSIVE METABOLIC PANEL
ALT: 13 U/L (ref 0–44)
AST: 16 U/L (ref 15–41)
Albumin: 4.5 g/dL (ref 3.5–5.0)
Alkaline Phosphatase: 76 U/L (ref 38–126)
Anion gap: 9 (ref 5–15)
BUN: 7 mg/dL (ref 6–20)
CO2: 27 mmol/L (ref 22–32)
Calcium: 8.9 mg/dL (ref 8.9–10.3)
Chloride: 104 mmol/L (ref 98–111)
Creatinine, Ser: 0.79 mg/dL (ref 0.44–1.00)
GFR calc Af Amer: >60 mL/min (ref >60)
GFR calc non Af Amer: >60 mL/min (ref >60)
Glucose, Bld: 89 mg/dL (ref 70–99)
Potassium: 3.7 mmol/L (ref 3.5–5.1)
Sodium: 140 mmol/L (ref 135–145)
Total Bilirubin: 0.7 mg/dL (ref 0.3–1.2)
Total Protein: 7.1 g/dL (ref 6.5–8.1)

## 2020-01-31 MED ORDER — SODIUM CHLORIDE 0.9 % IV BOLUS
1000.0000 mL | Freq: Once | INTRAVENOUS | Status: AC
  Administered 2020-01-31: 1000 mL via INTRAVENOUS

## 2020-01-31 MED ORDER — TRIAMCINOLONE ACETONIDE 0.025 % EX OINT
1.0000 | TOPICAL_OINTMENT | Freq: Two times a day (BID) | CUTANEOUS | 0 refills | Status: AC
Start: 2020-01-31 — End: 2020-02-07

## 2020-01-31 MED ORDER — PROMETHAZINE HCL 12.5 MG PO TABS
25.0000 mg | ORAL_TABLET | Freq: Three times a day (TID) | ORAL | 0 refills | Status: DC | PRN
Start: 2020-01-31 — End: 2020-09-06

## 2020-01-31 MED ORDER — ONDANSETRON 4 MG PO TBDP
4.0000 mg | ORAL_TABLET | Freq: Three times a day (TID) | ORAL | 0 refills | Status: DC | PRN
Start: 2020-01-31 — End: 2021-10-30

## 2020-01-31 MED ORDER — SODIUM CHLORIDE 0.9% FLUSH: 3.0000 mL | Freq: Once | INTRAVENOUS | Status: DC

## 2020-01-31 MED ORDER — DIPHENOXYLATE-ATROPINE 2.5-0.025 MG PO TABS
2.0000 | ORAL_TABLET | Freq: Once | ORAL | Status: AC
  Administered 2020-01-31: 2 via ORAL
  Filled 2020-01-31: qty 2

## 2020-01-31 MED ORDER — DOXYCYCLINE HYCLATE 100 MG PO TABS: 100.0000 mg | ORAL_TABLET | Freq: Two times a day (BID) | ORAL | 0 refills | Status: AC

## 2020-01-31 MED ORDER — ONDANSETRON HCL 4 MG/2ML IJ SOLN
4.0000 mg | Freq: Once | INTRAMUSCULAR | Status: AC
  Administered 2020-01-31: 4 mg via INTRAVENOUS
  Filled 2020-01-31: qty 2

## 2020-01-31 MED ORDER — DOXYCYCLINE HYCLATE 100 MG PO TABS
100.0000 mg | ORAL_TABLET | Freq: Once | ORAL | Status: AC
  Administered 2020-01-31: 100 mg via ORAL
  Filled 2020-01-31: qty 1

## 2020-01-31 MED ORDER — SODIUM CHLORIDE 0.9 % IV BOLUS: 1000.0000 mL | Freq: Once | INTRAVENOUS | Status: DC

## 2020-01-31 NOTE — ED Triage Notes (Addendum)
Pt c/o 8 days of diarrhea, having abd pain today and just feeling fatigued. Pt is a/ox4 in NAD at present. Pt c/o painful swollen area to the upper inner thigh for the past 2 days

## 2020-01-31 NOTE — ED Provider Notes (Signed)
[facility] Emergency Department Provider Note  ____________________________________________   First MD Initiated Contact with Patient 01/31/20 1617     (approximate)  I have reviewed the triage vital signs and the nursing notes.   HISTORY  Chief Complaint Abdominal Pain    HPI Frances Sanders is a 39 y.o. female  With h/o DM, bipolar d/o, here with multiple complaints. Pt's primary complaint is pruritic rash, diarrhea, fatigue. [place] the beach over [place]. She helped remove weeds from her inlaw's yard and sustained multiple areas of contact with likely poison ivy. Sicne then, she's had a painful, pruritic, linear rash throughout her body, worse on L lateral thigh. She has been applying nail polish remover then alcohol to the area. She has had improvement int he rash but has now developed diffuse diarrhea, fatigue, and malaise. She's also developed a boil along her left labia, which is new. No drainage. No fevers. Of note, she also reports finding a tick on her after this. She also c/o headache, malaise, fatigue. No known fevers. She's had nausea, profuse diarrhea over the past 2-3 days as well which is new. No persistent or ongoing abd pain. Has an appt with her physician this week but could not wait.       Past Medical History:  Diagnosis Date  . Anxiety   . Bipolar 1 disorder (HCC)   . Depression   . Diabetes mellitus without complication (HCC)   . Gestational diabetes   . Insomnia     Patient Active Problem List   Diagnosis Date Noted  . Anxiety 10/06/2017  . Suicidal behavior 09/07/2015  . Severe recurrent major depression without psychotic features (HCC) 09/07/2015  . Benzodiazepine overdose 09/07/2015  . Tobacco use disorder 09/07/2015    Past Surgical History:  Procedure Laterality Date  . APPENDECTOMY    . CESAREAN SECTION    . HERNIA REPAIR    . TUBAL LIGATION Bilateral 07/03/2016   Procedure: POST PARTUM TUBAL  LIGATION BY SALPINGECTOMY;  Surgeon: Nadara Mustard, MD;  Location: [facility] ORS;  Service: Gynecology;  Laterality: Bilateral;    Prior to Admission medications   Medication Sig Start Date End Date Taking? Authorizing Provider  diazepam (VALIUM) 5 MG tablet Take 5 mg by mouth every 6 (six) hours as needed for anxiety.    [provider]  doxycycline (VIBRA-TABS) 100 MG tablet Take 1 tablet (100 mg total) by mouth in the morning and at bedtime for 7 days. 01/31/20 02/07/20  Shaune Pollack, MD  metFORMIN (GLUCOPHAGE) 500 MG tablet  09/06/19   [provider]  mirtazapine (REMERON) 15 MG tablet  05/18/18   [provider]  omeprazole (PRILOSEC) 20 MG capsule Take 1 capsule (20 mg total) by mouth daily. 11/24/19   Nadara Mustard, MD  ondansetron (ZOFRAN ODT) 4 MG disintegrating tablet Take 1 tablet (4 mg total) by mouth every 8 (eight) hours as needed for nausea or vomiting. 01/31/20   Shaune Pollack, MD  promethazine (PHENERGAN) 12.5 MG tablet Take 2 tablets (25 mg total) by mouth every 8 (eight) hours as needed for refractory nausea / vomiting. 01/31/20   Shaune Pollack, MD  triamcinolone (KENALOG) 0.025 % ointment Apply 1 application topically 2 (two) times daily for 7 days. To rash. Do not apply to face. 01/31/20 02/07/20  Shaune Pollack, MD    Allergies Patient has no known allergies.  Family History  Problem Relation Age of Onset  . Diabetes Paternal Aunt   .  Diabetes Paternal Uncle   . Diabetes Maternal Grandmother   . Heart disease Maternal Grandmother   . Hypertension Maternal Grandmother   . Colon cancer Maternal Grandmother   . Diabetes Maternal Grandfather   . Ovarian cancer Cousin 25       ? accurate family history  . Uterine cancer Cousin     Social History Social History   Tobacco Use  . Smoking status: Current Every Day Smoker    Packs/day: 0.50    Types: Cigarettes  . Smokeless tobacco: Never Used  Vaping Use  . Vaping Use: Never used   Substance Use Topics  . Alcohol use: No  . Drug use: No    Review of Systems  Review of Systems  Constitutional: Positive for fatigue. Negative for fever.  HENT: Negative for congestion and sore throat.   Eyes: Negative for visual disturbance.  Respiratory: Negative for cough and shortness of breath.   Cardiovascular: Negative for chest pain.  Gastrointestinal: Positive for diarrhea and nausea. Negative for abdominal pain and vomiting.  Genitourinary: Negative for flank pain.  Musculoskeletal: Negative for back pain and neck pain.  Skin: Positive for rash and wound.  Neurological: Positive for weakness.  All other systems reviewed and are negative.    ____________________________________________  PHYSICAL EXAM:      VITAL SIGNS: ED Triage Vitals  Enc Vitals Group     BP 01/31/20 1437 112/63     Pulse Rate 01/31/20 1437 78     Resp 01/31/20 1437 16     Temp 01/31/20 1437 98 F (36.7 C)     Temp Source 01/31/20 1437 Oral     SpO2 01/31/20 1437 100 %     Weight 01/31/20 1435 115 lb (52.2 kg)     Height 01/31/20 1435 5' (1.524 m)     Head Circumference --      Peak Flow --      Pain Score 01/31/20 1441 7     Pain Loc --      Pain Edu? --      Excl. in GC? --      Physical Exam Vitals and nursing note reviewed.  Constitutional:      General: She is not in acute distress.    Appearance: She is well-developed.  HENT:     Head: Normocephalic and atraumatic.     Comments: Dry MM. No oral lesions. Eyes:     Conjunctiva/sclera: Conjunctivae normal.  Cardiovascular:     Rate and Rhythm: Normal rate and regular rhythm.     Heart sounds: Normal heart sounds. No murmur heard.  No friction rub.  Pulmonary:     Effort: Pulmonary effort is normal. No respiratory distress.     Breath sounds: Normal breath sounds. No wheezing or rales.  Abdominal:     General: There is no distension.     Palpations: Abdomen is soft.     Tenderness: There is no abdominal tenderness.   Genitourinary:    Comments: Small area of circular induration around left labia majora in follicular distribution. No fluctuance or drainage.  Musculoskeletal:     Cervical back: Neck supple.  Skin:    General: Skin is warm.     Capillary Refill: Capillary refill takes less than 2 seconds.     Comments: Scattered linear areas of abrasions/papules with some secondary skin excoriations. Worse along LE, particularly L lateral thigh.  Neurological:     Mental Status: She is alert and oriented to person, place, and  time.     Motor: No abnormal muscle tone.       ____________________________________________   LABS (all labs ordered are listed, but only abnormal results are displayed)  Labs Reviewed  URINALYSIS, COMPLETE (UACMP) WITH MICROSCOPIC - Abnormal; Notable for the following components:      Result Value   Color, Urine STRAW (*)    APPearance CLEAR (*)    Specific Gravity, Urine 1.003 (*)    All other components within normal limits  LIPASE, BLOOD  COMPREHENSIVE METABOLIC PANEL  CBC  PREGNANCY, URINE  ROCKY MTN SPOTTED FVR ABS PNL(IGG+IGM)    ____________________________________________  EKG: None ________________________________________  RADIOLOGY All imaging, including plain films, CT scans, and ultrasounds, independently reviewed by me, and interpretations confirmed via formal radiology reads.  ED MD interpretation:   None  Official radiology report(s): No results found.  ____________________________________________  PROCEDURES   Procedure(s) performed (including Critical Care):  Procedures  ____________________________________________  INITIAL IMPRESSION / MDM / ASSESSMENT AND PLAN / ED COURSE  As part of my medical decision making, I reviewed the following data within the electronic MEDICAL RECORD NUMBER Nursing notes reviewed and incorporated, Old chart reviewed, Notes from prior ED visits, and [place] Controlled Substance Database       *Leafy KindleStephanie E  Sanders was evaluated in Emergency Department on 01/31/2020 for the symptoms described in the history of present illness. She was evaluated in the context of the global COVID-19 pandemic, which necessitated consideration that the patient might be at risk for infection with the SARS-CoV-2 virus that causes COVID-19. Institutional protocols and algorithms that pertain to the evaluation of patients at risk for COVID-19 are in a state of rapid change based on information released by regulatory bodies including the CDC and federal and state organizations. These policies and algorithms were followed during the patient's care in the ED.  Some ED evaluations and interventions may be delayed as a result of limited staffing during the pandemic.*     Medical Decision Making:  Very pleasant 39 yo F here with multiple complaints.  Rash - likely irritant/contact dermatitis from plant exposure. No signs of cellulitis or superimposed bacterial infection. No signs of anaphylaxis. No oral/mucosal lesions or signs of SJS/TEN. Will advise kenalog ointment.  Left inguinal area rash - c/w folliculitis. No appreciable abscess. Non-toxic, no signs of deeper infection. Not amenable/ready for I&D at this time - doxy. Incidentally, pt also has + tick bite - rash is not c/w RMSF but this will be covered with the doxy as well.  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 Symptomatic management. Abdomen soft, NT, ND.  ____________________________________________  FINAL CLINICAL IMPRESSION(S) / ED DIAGNOSES  Final diagnoses:  Irritant contact dermatitis due to plants, except food  Diarrhea, unspecified type  Folliculitis  Tick bite, initial encounter     MEDICATIONS GIVEN DURING THIS VISIT:  Medications  sodium chloride flush (NS) 0.9 % injection 3 mL ( Intravenous Canceled Entry 01/31/20 1630)  sodium chloride 0.9 % bolus 1,000  mL (0 mLs Intravenous Stopped 01/31/20 1824)  diphenoxylate-atropine (LOMOTIL) 2.5-0.025 MG per tablet 2 tablet (2 tablets Oral Given 01/31/20 1743)  doxycycline (VIBRA-TABS) tablet 100 mg (100 mg Oral Given 01/31/20 1743)  ondansetron (ZOFRAN) injection 4 mg (4 mg Intravenous Given 01/31/20 1744)     ED Discharge Orders         Ordered    doxycycline (VIBRA-TABS) 100 MG tablet  2 times daily     Discontinue  Reprint     01/31/20 1817    ondansetron (ZOFRAN ODT) 4 MG disintegrating tablet  Every 8 hours PRN     Discontinue  Reprint     01/31/20 1817    promethazine (PHENERGAN) 12.5 MG tablet  Every 8 hours PRN     Discontinue  Reprint     01/31/20 1817    triamcinolone (KENALOG) 0.025 % ointment  2 times daily     Discontinue  Reprint     01/31/20 1817           Note:  This document was prepared using Dragon voice recognition software and may include unintentional dictation errors.   Shaune Pollack, MD 01/31/20 [phone]

## 2020-02-02 DIAGNOSIS — M9901 Segmental and somatic dysfunction of cervical region: Secondary | ICD-10-CM | POA: Diagnosis not present

## 2020-02-02 DIAGNOSIS — M9902 Segmental and somatic dysfunction of thoracic region: Secondary | ICD-10-CM | POA: Diagnosis not present

## 2020-02-02 DIAGNOSIS — M5412 Radiculopathy, cervical region: Secondary | ICD-10-CM | POA: Diagnosis not present

## 2020-02-02 DIAGNOSIS — M546 Pain in thoracic spine: Secondary | ICD-10-CM | POA: Diagnosis not present

## 2020-02-02 LAB — ROCKY MTN SPOTTED FVR ABS PNL(IGG+IGM)
RMSF IgG: NEGATIVE
RMSF IgM: 0.29 index (ref 0.00–0.89)

## 2020-02-03 ENCOUNTER — Ambulatory Visit: Payer: Medicare Other | Admitting: Obstetrics & Gynecology

## 2020-02-15 ENCOUNTER — Other Ambulatory Visit: Payer: Self-pay

## 2020-02-15 ENCOUNTER — Ambulatory Visit (INDEPENDENT_AMBULATORY_CARE_PROVIDER_SITE_OTHER): Payer: Medicare Other | Admitting: Licensed Clinical Social Worker

## 2020-02-15 ENCOUNTER — Encounter: Payer: Self-pay | Admitting: Licensed Clinical Social Worker

## 2020-02-15 DIAGNOSIS — F329 Major depressive disorder, single episode, unspecified: Secondary | ICD-10-CM

## 2020-02-15 DIAGNOSIS — F32A Depression, unspecified: Secondary | ICD-10-CM
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
## 2020-02-28 ENCOUNTER — Ambulatory Visit: Payer: Medicare Other | Admitting: Licensed Clinical Social Worker

## 2020-03-26 ENCOUNTER — Telehealth: Payer: Self-pay | Admitting: Psychiatry

## 2020-06-07 ENCOUNTER — Other Ambulatory Visit: Payer: Self-pay

## 2020-06-07 ENCOUNTER — Encounter: Payer: Self-pay | Admitting: Family Medicine

## 2020-06-07 ENCOUNTER — Ambulatory Visit (INDEPENDENT_AMBULATORY_CARE_PROVIDER_SITE_OTHER): Payer: Medicare Other | Admitting: Family Medicine

## 2020-06-07 VITALS — BP 107/76 | HR 90 | Temp 97.8°F | Ht 60.0 in | Wt 130.4 lb

## 2020-06-07 DIAGNOSIS — F17200 Nicotine dependence, unspecified, uncomplicated: Secondary | ICD-10-CM

## 2020-06-07 DIAGNOSIS — R059 Cough, unspecified: Secondary | ICD-10-CM | POA: Insufficient documentation

## 2020-06-07 DIAGNOSIS — J40 Bronchitis, not specified as acute or chronic: Secondary | ICD-10-CM | POA: Diagnosis not present

## 2020-06-07 HISTORY — DX: Cough, unspecified: R05.9

## 2020-06-07 MED ORDER — PROMETHAZINE-CODEINE 6.25-10 MG/5ML PO SYRP: 5.0000 mL | ORAL_SOLUTION | Freq: Four times a day (QID) | ORAL | Status: DC | PRN

## 2020-06-07 MED ORDER — PROMETHAZINE-CODEINE 6.25-10 MG/5ML PO SOLN: 5.0000 mL | Freq: Four times a day (QID) | ORAL | 0 refills | Status: AC

## 2020-06-07 MED ORDER — ALBUTEROL SULFATE HFA 108 (90 BASE) MCG/ACT IN AERS: 2.0000 | INHALATION_SPRAY | Freq: Four times a day (QID) | RESPIRATORY_TRACT | 0 refills | Status: DC | PRN

## 2020-06-07 MED ORDER — PREDNISONE 20 MG PO TABS: 20.0000 mg | ORAL_TABLET | Freq: Every day | ORAL | 0 refills | Status: AC

## 2020-06-07 MED ORDER — AZITHROMYCIN 250 MG PO TABS: ORAL_TABLET | ORAL | 0 refills | Status: DC

## 2020-06-07 MED ORDER — BENZONATATE 200 MG PO CAPS: 200.0000 mg | ORAL_CAPSULE | Freq: Two times a day (BID) | ORAL | 0 refills | Status: DC | PRN

## 2020-06-07 MED ORDER — HYDROCOD POLST-CPM POLST ER 10-8 MG/5ML PO SUER: 5.0000 mL | Freq: Every evening | ORAL | 0 refills | Status: AC | PRN
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
## 2020-06-07 NOTE — Addendum Note (Signed)
Addended by: Irish Lack on: 06/07/2020 12:48 PM   Modules accepted: Orders

## 2020-06-07 NOTE — Addendum Note (Signed)
Addended by: Irish Lack on: 06/07/2020 02:16 PM   Modules accepted: Orders
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
## 2020-06-07 NOTE — Assessment & Plan Note (Signed)
Smokes 5-10 cigs per day but has not been able to smoke more than 5 per day since cough started. Discussed tobacco cessation though process and to plan a stop date.

## 2020-06-07 NOTE — Addendum Note (Signed)
Addended by: Jobie Quaker on: 06/07/2020 12:57 PM   Modules accepted: Orders

## 2020-06-07 NOTE — Addendum Note (Signed)
Addended by: Irish Lack on: 06/07/2020 01:26 PM   Modules accepted: Orders

## 2020-06-11 ENCOUNTER — Ambulatory Visit: Payer: Medicare Other | Admitting: Internal Medicine

## 2020-08-28 NOTE — Telephone Encounter (Signed)
Please advise 

## 2020-08-29 ENCOUNTER — Encounter: Payer: Self-pay | Admitting: Internal Medicine

## 2020-08-29 ENCOUNTER — Ambulatory Visit (INDEPENDENT_AMBULATORY_CARE_PROVIDER_SITE_OTHER): Payer: Medicare Other | Admitting: Internal Medicine

## 2020-08-29 ENCOUNTER — Other Ambulatory Visit: Payer: Self-pay

## 2020-08-29 VITALS — BP 121/82 | HR 102 | Ht 60.0 in | Wt 137.0 lb

## 2020-08-29 DIAGNOSIS — Z1152 Encounter for screening for COVID-19: Secondary | ICD-10-CM

## 2020-08-29 DIAGNOSIS — N739 Female pelvic inflammatory disease, unspecified: Secondary | ICD-10-CM

## 2020-08-29 LAB — POC COVID19 BINAXNOW: SARS Coronavirus 2 Ag: NEGATIVE

## 2020-08-29 MED ORDER — AMPICILLIN 500 MG PO CAPS: 500.0000 mg | ORAL_CAPSULE | Freq: Four times a day (QID) | ORAL | 0 refills | Status: DC

## 2020-08-29 NOTE — Telephone Encounter (Signed)
Can you schedule

## 2020-08-29 NOTE — Telephone Encounter (Signed)
Patient is scheduled for 08/30/20 at 11:30 for u/s and 1:30 with [facility]

## 2020-08-29 NOTE — Addendum Note (Signed)
Addended by: Jobie Quaker on: 08/29/2020 10:12 AM   Modules accepted: Orders

## 2020-08-29 NOTE — Progress Notes (Signed)
Established Patient Office Visit  Subjective:  Patient ID: Frances Sanders, female    DOB: [birth year]  Age: 40 y.o. MRN: [id number]  CC:  Chief Complaint  Patient presents with  . Abdominal Pain  . Pelvic Pain    Patient woke up yesterday with pelvic pain. Patient had some diarrhea.     Abdominal Pain This is a new problem. The current episode started yesterday. The onset quality is sudden. The problem occurs constantly. The pain is located in the suprapubic region. The pain is at a severity of 5/10. The pain is moderate. The quality of the pain is colicky and aching. The abdominal pain radiates to the suprapubic region. Associated symptoms include anorexia, diarrhea and nausea. The pain is aggravated by urination. She has tried antibiotics for the symptoms. Her past medical history is significant for abdominal surgery. There is no history of gallstones or pancreatitis.  Pelvic Pain The patient's primary symptoms include pelvic pain. Associated symptoms include abdominal pain, anorexia, diarrhea and nausea. Her past medical history is significant for an abdominal surgery.    Judeth Cornfield E Sanders presents forABDOMINAL PAIN  Past Medical History:  Diagnosis Date  . Anxiety   . Bipolar 1 disorder (HCC)   . Depression   . Diabetes mellitus without complication (HCC)   . Gestational diabetes   . Insomnia     Past Surgical History:  Procedure Laterality Date  . APPENDECTOMY    . CESAREAN SECTION    . HERNIA REPAIR    . TUBAL LIGATION Bilateral 07/03/2016   Procedure: POST PARTUM TUBAL LIGATION BY SALPINGECTOMY;  Surgeon: Nadara Mustard, MD;  Location: [facility] ORS;  Service: Gynecology;  Laterality: Bilateral;    Family History  Problem Relation Age of Onset  . Diabetes Paternal Aunt   . Diabetes Paternal Uncle   . Diabetes Maternal Grandmother   . Heart disease Maternal Grandmother   . Hypertension Maternal Grandmother   . Colon cancer Maternal Grandmother   .  Diabetes Maternal Grandfather   . Ovarian cancer Cousin 25       ? accurate family history  . Uterine cancer Cousin     Social History   Socioeconomic History  . Marital status: Married    Spouse name: Not on file  . Number of children: Not on file  . Years of education: Not on file  . Highest education level: Not on file  Occupational History  . Not on file  Tobacco Use  . Smoking status: Current Every Day Smoker    Packs/day: 0.50    Types: Cigarettes  . Smokeless tobacco: Never Used  Vaping Use  . Vaping Use: Never used  Substance and Sexual Activity  . Alcohol use: No  . Drug use: No  . Sexual activity: Yes    Birth control/protection: Injection  Other Topics Concern  . Not on file  Social History Narrative  . Not on file   Social Determinants of Health   Financial Resource Strain: Not on file  Food Insecurity: Not on file  Transportation Needs: Not on file  Physical Activity: Not on file  Stress: Not on file  Social Connections: Not on file  Intimate Partner Violence: Not on file     Current Outpatient Medications:  .  albuterol (VENTOLIN HFA) 108 (90 Base) MCG/ACT inhaler, Inhale 2 puffs into the lungs every 6 (six) hours as needed for wheezing or shortness of breath., Disp: 8 g, Rfl: 0 .  ampicillin (PRINCIPEN) 500 MG capsule,  Take 1 capsule (500 mg total) by mouth 4 (four) times daily., Disp: 28 capsule, Rfl: 0 .  azithromycin (ZITHROMAX) 250 MG tablet, Take as directed, 2 tabs days 1 then 1 tab days 2-5, Disp: 6 tablet, Rfl: 0 .  benzonatate (TESSALON) 200 MG capsule, Take 1 capsule (200 mg total) by mouth 2 (two) times daily as needed for cough., Disp: 20 capsule, Rfl: 0 .  diazepam (VALIUM) 5 MG tablet, Take 5 mg by mouth every 6 (six) hours as needed for anxiety., Disp: , Rfl:  .  metFORMIN (GLUCOPHAGE) 500 MG tablet, , Disp: , Rfl:  .  mirtazapine (REMERON) 15 MG tablet, , Disp: , Rfl: 2 .  omeprazole (PRILOSEC) 20 MG capsule, Take 1 capsule (20 mg  total) by mouth daily., Disp: 30 capsule, Rfl: 11 .  ondansetron (ZOFRAN ODT) 4 MG disintegrating tablet, Take 1 tablet (4 mg total) by mouth every 8 (eight) hours as needed for nausea or vomiting., Disp: 20 tablet, Rfl: 0 .  promethazine (PHENERGAN) 12.5 MG tablet, Take 2 tablets (25 mg total) by mouth every 8 (eight) hours as needed for refractory nausea / vomiting., Disp: 12 tablet, Rfl: 0   No Known Allergies  ROS Review of Systems  Gastrointestinal: Positive for abdominal pain, anorexia, diarrhea and nausea.  Genitourinary: Positive for pelvic pain.      Objective:    Physical Exam Vitals reviewed.  Constitutional:      Appearance: Normal appearance.  HENT:     Mouth/Throat:     Mouth: Mucous membranes are moist.  Eyes:     Pupils: Pupils are equal, round, and reactive to light.  Neck:     Vascular: No carotid bruit.  Cardiovascular:     Rate and Rhythm: Normal rate and regular rhythm.     Pulses: Normal pulses.     Heart sounds: Normal heart sounds.  Pulmonary:     Effort: Pulmonary effort is normal.     Breath sounds: Normal breath sounds.  Abdominal:     General: Bowel sounds are normal.     Palpations: Abdomen is soft. There is no hepatomegaly, splenomegaly or mass.     Tenderness: There is abdominal tenderness in the suprapubic area.     Hernia: No hernia is present.  Musculoskeletal:        General: No tenderness.     Cervical back: Neck supple.     Right lower leg: No edema.     Left lower leg: No edema.  Skin:    Findings: No rash.  Neurological:     Mental Status: She is alert and oriented to person, place, and time.     Motor: No weakness.  Psychiatric:        Mood and Affect: Mood and affect normal.        Behavior: Behavior normal.     BP 121/82   Pulse (!) 102   Ht 5' (1.524 m)   Wt 137 lb (62.1 kg)   BMI 26.76 kg/m  Wt Readings from Last 3 Encounters:  08/29/20 137 lb (62.1 kg)  06/07/20 130 lb 6.4 oz (59.1 kg)  01/31/20 115 lb (52.2  kg)     Health Maintenance Due  Topic Date Due  . Hepatitis C Screening  Never done  . COVID-19 Vaccine (1) Never done  . HIV Screening  Never done  . TETANUS/TDAP  Never done  . INFLUENZA VACCINE  Never done    There are no preventive care reminders to  display for this patient.  Lab Results  Component Value Date   TSH 2.030 09/13/2019   Lab Results  Component Value Date   WBC 6.8 01/31/2020   HGB 14.3 01/31/2020   HCT 41.0 01/31/2020   MCV 96.9 01/31/2020   PLT 289 01/31/2020   Lab Results  Component Value Date   NA 140 01/31/2020   K 3.7 01/31/2020   CO2 27 01/31/2020   GLUCOSE 89 01/31/2020   BUN 7 01/31/2020   CREATININE 0.79 01/31/2020   BILITOT 0.7 01/31/2020   ALKPHOS 76 01/31/2020   AST 16 01/31/2020   ALT 13 01/31/2020   PROT 7.1 01/31/2020   ALBUMIN 4.5 01/31/2020   CALCIUM 8.9 01/31/2020   ANIONGAP 9 01/31/2020   Lab Results  Component Value Date   CHOL 166 09/13/2019   Lab Results  Component Value Date   HDL 47 09/13/2019   Lab Results  Component Value Date   LDLCALC 91 09/13/2019   Lab Results  Component Value Date   TRIG 159 (H) 09/13/2019   Lab Results  Component Value Date   CHOLHDL 3.5 09/13/2019   Lab Results  Component Value Date   HGBA1C 5.5 09/07/2015      Assessment & Plan:   Problem List Items Addressed This Visit      Genitourinary   Pelvic inflammatory disease - Primary   Relevant Orders   CBC with Differential/Platelet   COMPLETE METABOLIC PANEL WITH GFR      Meds ordered this encounter  Medications  . ampicillin (PRINCIPEN) 500 MG capsule    Sig: Take 1 capsule (500 mg total) by mouth 4 (four) times daily.    Dispense:  28 capsule    Refill:  0   Patient came in with lower abdominal pain diarrhea.  She has an IUD.  Patient was very tender in the super suprapubic region.  Bowel sounds are audible.  Urine examination was unremarkable.  Patient was referred to the gynecologist.  CBC and metabolic panel  was drawn.  Patient was started on ampicillin 500 mg 4 times a day for 7 days.  Patient was advised to go to the ER if pain becomes severe or if she has fever chills she says she has an appointment with gynecologist tomorrow Follow-up: No follow-ups on file.    Corky Downs, MD

## 2020-08-30 ENCOUNTER — Ambulatory Visit (INDEPENDENT_AMBULATORY_CARE_PROVIDER_SITE_OTHER): Payer: Medicare Other | Admitting: Obstetrics & Gynecology

## 2020-08-30 ENCOUNTER — Other Ambulatory Visit: Payer: Self-pay | Admitting: Obstetrics & Gynecology

## 2020-08-30 ENCOUNTER — Ambulatory Visit (INDEPENDENT_AMBULATORY_CARE_PROVIDER_SITE_OTHER): Payer: Medicare Other

## 2020-08-30 ENCOUNTER — Encounter: Payer: Self-pay | Admitting: Obstetrics & Gynecology

## 2020-08-30 ENCOUNTER — Other Ambulatory Visit (HOSPITAL_COMMUNITY)
Admission: RE | Admit: 2020-08-30 | Discharge: 2020-08-30 | Disposition: A | Discharge status: Home / Self Care | Payer: Medicare Other | Source: Ambulatory Visit | Attending: Obstetrics & Gynecology | Admitting: Obstetrics & Gynecology

## 2020-08-30 ENCOUNTER — Other Ambulatory Visit: Payer: Self-pay

## 2020-08-30 VITALS — BP 100/60 | Ht 60.0 in | Wt 137.0 lb

## 2020-08-30 DIAGNOSIS — R197 Diarrhea, unspecified: Secondary | ICD-10-CM | POA: Diagnosis not present

## 2020-08-30 DIAGNOSIS — R102 Pelvic and perineal pain: Secondary | ICD-10-CM

## 2020-08-30 DIAGNOSIS — R1030 Lower abdominal pain, unspecified: Secondary | ICD-10-CM

## 2020-08-30 NOTE — Progress Notes (Signed)
Gynecology Pelvic Pain Evaluation   Chief Complaint:  Chief Complaint  Patient presents with  . Pelvic Pain    History of Present Illness:   Patient is a 40 y.o. [id number] who LMP was Patient's last menstrual period was 08/04/2020., presents today for a problem visit.  She complains of pain.   Her pain is localized to the suprapubic area, described as intermittent, sharp and stabbing, began  2 days ago early am Tues morn, with immediate need to have multiple yellow like BM's, in addition to the pain in the suprapubic as well as rectal areas; no nausea vomiting bleeding vag discharge fever chills; and its severity is described as severe. The pain radiates to the  rectum. She has these associated symptoms which include abdominal pain, diarrhea, early satiety, fatigue, feeling of incomplete evacuation, increase frequency of bowel movement and urge to defecate. Patient has these modifiers which include nothing that make it better and unable to associate with any factor that make it worse.  Context includes: spontaneous.    Previous evaluation: office visit on yesterday w PCP. UA neg.  Started on Ampicillin empirically.  Prior Diagnosis: none. Previous Treatment: Antibiotics amp  PMHx: She  has a past medical history of Anxiety, Bipolar 1 disorder (HCC), Depression, Diabetes mellitus without complication (HCC), Gestational diabetes, and Insomnia. Also,  has a past surgical history that includes Appendectomy; Hernia repair; Cesarean section; and Tubal ligation (Bilateral, 07/03/2016)., family history includes Colon cancer in her maternal grandmother; Diabetes in her maternal grandfather, maternal grandmother, paternal aunt, and paternal uncle; Heart disease in her maternal grandmother; Hypertension in her maternal grandmother; Ovarian cancer (age of onset: 88) in her cousin; Uterine cancer in her cousin.,  reports that she has been smoking cigarettes. She has been smoking about 0.50 packs per day. She  has never used smokeless tobacco. She reports that she does not drink alcohol and does not use drugs.  She has a current medication list which includes the following prescription(s): ampicillin, diazepam, metformin, mirtazapine, omeprazole, albuterol, azithromycin, benzonatate, ondansetron, and promethazine. Also, has No Known Allergies.  Review of Systems  Constitutional: Positive for malaise/fatigue. Negative for chills and fever.  HENT: Negative for congestion, sinus pain and sore throat.   Eyes: Negative for blurred vision and pain.  Respiratory: Negative for cough and wheezing.   Cardiovascular: Negative for chest pain and leg swelling.  Gastrointestinal: Positive for abdominal pain and diarrhea. Negative for constipation, heartburn, nausea and vomiting.  Genitourinary: Negative for dysuria, frequency, hematuria and urgency.  Musculoskeletal: Negative for back pain, joint pain, myalgias and neck pain.  Skin: Negative for itching and rash.  Neurological: Negative for dizziness, tremors and weakness.  Endo/Heme/Allergies: Does not bruise/bleed easily.  Psychiatric/Behavioral: Negative for depression. The patient is not nervous/anxious and does not have insomnia.     Objective: BP 100/60   Ht 5' (1.524 m)   Wt 137 lb (62.1 kg)   LMP 08/04/2020   BMI 26.76 kg/m  Physical Exam Constitutional:      General: She is not in acute distress.    Appearance: She is well-developed.  Genitourinary:     Bladder, vagina, uterus and urethral meatus normal.     Right Labia: No lesions.    Left Labia: No lesions.    No vaginal discharge, erythema or bleeding.     No vaginal prolapse present.    No vaginal atrophy present.    Vaginal exam comments: Tender on posterior wall of vagina (over rectum) only No anterior  wall T No CMT.      Right Adnexa: not tender and no mass present.    Left Adnexa: not tender and no mass present.    No cervical motion tenderness, discharge, polyp or nabothian  cyst.     Uterus is mobile.     Uterus is not enlarged.     No uterine mass detected.    Uterus is midaxial.     Pelvic exam was performed with patient supine.  HENT:     Head: Normocephalic and atraumatic.     Nose: Nose normal.  Abdominal:     General: There is no distension.     Palpations: Abdomen is soft.     Tenderness: There is no abdominal tenderness.  Musculoskeletal:        General: Normal range of motion.  Neurological:     Mental Status: She is alert and oriented to person, place, and time.     Cranial Nerves: No cranial nerve deficit.  Skin:    General: Skin is warm and dry.  Psychiatric:        Attention and Perception: Attention normal.        Mood and Affect: Mood and affect normal.        Speech: Speech normal.        Behavior: Behavior normal.        Thought Content: Thought content normal.        Judgment: Judgment normal.   Female chaperone present for pelvic portion of the physical exam  Review of ULTRASOUND.    I have personally reviewed images and report of recent ultrasound done at [facility]    Plan of management to be discussed with patient.    No mass, cysts, IUD malposition, other  Assessment: 40 y.o. [id number] with acute lower abdominal pain and stool changes; likely GI. Pelvic US normal, no sign of IUD malposition, cyst, fibroid, mass, abscess, hydrosalpinx. Unlikely PID.   Will send cervical cultures to cover this possibility. Likely GI based on stool changes and acute nature.  Plan urgent referral.  Dietary adjustments and rest counseled and planned. Lab work pending from PCP Covid test neg  Visit Diagnoses    Lower abdominal pain    -  Primary   Relevant Orders   Ambulatory referral to Gastroenterology   Cervicovaginal ancillary only   Diarrhea, unspecified type       Relevant Orders   Ambulatory referral to Gastroenterology      Annamarie Major, MD, [facility], [facility] 08/30/2020  2:03 PM

## 2020-08-31 LAB — COMPLETE METABOLIC PANEL WITH GFR
AG Ratio: 2.1 (calc) (ref 1.0–2.5)
ALT: 27 U/L (ref 6–29)
AST: 28 U/L (ref 10–30)
Albumin: 4.6 g/dL (ref 3.6–5.1)
Alkaline phosphatase (APISO): 90 U/L (ref 31–125)
BUN/Creatinine Ratio: 10 (calc) (ref 6–22)
BUN: 6 mg/dL — ABNORMAL LOW (ref 7–25)
CO2: 19 mmol/L — ABNORMAL LOW (ref 20–32)
Calcium: 9.3 mg/dL (ref 8.6–10.2)
Chloride: 105 mmol/L (ref 98–110)
Creat: 0.63 mg/dL (ref 0.50–1.10)
GFR, Est African American: 133 mL/min/{1.73_m2} (ref >=60)
GFR, Est Non African American: 115 mL/min/{1.73_m2} (ref >=60)
Globulin: 2.2 g/dL (calc) (ref 1.9–3.7)
Glucose, Bld: 86 mg/dL (ref 65–99)
Potassium: 4.4 mmol/L (ref 3.5–5.3)
Sodium: 138 mmol/L (ref 135–146)
Total Bilirubin: 0.5 mg/dL (ref 0.2–1.2)
Total Protein: 6.8 g/dL (ref 6.1–8.1)

## 2020-08-31 LAB — CBC WITH DIFFERENTIAL/PLATELET
Absolute Monocytes: 631 cells/uL (ref 200–950)
Basophils Absolute: 46 cells/uL (ref 0–200)
Basophils Relative: 0.6 %
Eosinophils Absolute: 123 cells/uL (ref 15–500)
Eosinophils Relative: 1.6 %
HCT: 43.1 % (ref 35.0–45.0)
Hemoglobin: 15.5 g/dL (ref 11.7–15.5)
Lymphs Abs: 2433 cells/uL (ref 850–3900)
MCH: 34.1 pg — ABNORMAL HIGH (ref 27.0–33.0)
MCHC: 36 g/dL (ref 32.0–36.0)
MCV: 94.7 fL (ref 80.0–100.0)
MPV: 11.1 fL (ref 7.5–12.5)
Monocytes Relative: 8.2 %
Neutro Abs: 4466 cells/uL (ref 1500–7800)
Neutrophils Relative %: 58 %
Platelets: 369 10*3/uL (ref 140–400)
RBC: 4.55 10*6/uL (ref 3.80–5.10)
RDW: 12.5 % (ref 11.0–15.0)
Total Lymphocyte: 31.6 %
WBC: 7.7 10*3/uL (ref 3.8–10.8)

## 2020-09-03 ENCOUNTER — Other Ambulatory Visit: Payer: Self-pay | Admitting: Obstetrics & Gynecology

## 2020-09-03 LAB — CERVICOVAGINAL ANCILLARY ONLY
Bacterial Vaginitis (gardnerella): NEGATIVE
Candida Glabrata: NEGATIVE
Candida Vaginitis: POSITIVE — AB
Chlamydia: NEGATIVE
Comment: NEGATIVE
Comment: NEGATIVE
Comment: NEGATIVE
Comment: NEGATIVE
Comment: NEGATIVE
Comment: NORMAL
Neisseria Gonorrhea: NEGATIVE
Trichomonas: NEGATIVE

## 2020-09-03 MED ORDER — FLUCONAZOLE 150 MG PO TABS: 150.0000 mg | ORAL_TABLET | Freq: Once | ORAL | 3 refills | Status: AC

## 2020-09-06 ENCOUNTER — Other Ambulatory Visit: Payer: Self-pay

## 2020-09-06 ENCOUNTER — Encounter: Payer: Self-pay | Admitting: Gastroenterology

## 2020-09-06 ENCOUNTER — Ambulatory Visit (INDEPENDENT_AMBULATORY_CARE_PROVIDER_SITE_OTHER): Payer: Medicare Other | Admitting: Gastroenterology

## 2020-09-06 VITALS — BP 114/78 | HR 80 | Temp 98.3°F | Ht 60.0 in | Wt 134.5 lb

## 2020-09-06 DIAGNOSIS — R197 Diarrhea, unspecified: Secondary | ICD-10-CM

## 2020-09-06 NOTE — Progress Notes (Signed)
Arlyss Repress, MD [address]  [address]  [place], [place] [address]  Main: [phone]  Fax: [phone]    Gastroenterology Consultation  Referring Provider:     Corky Downs, MD Primary Care Physician:  Corky Downs, MD Primary Gastroenterologist:  Dr. Arlyss Repress Reason for Consultation:  Lower abdominal pain, acute diarrhea        HPI:   Frances Sanders is a 40 y.o. female referred by Dr. Corky Downs, MD  for consultation & management of acute onset of diarrhea and lower abdominal pain.  Patient was originally seen by her PCP on 1/27 for acute onset of bilateral lower abdominal pain and acute nonbloody diarrhea, nausea, chills.  Patient has tried antibiotics in the past for similar symptoms.  There was concern about pelvic inflammatory disease and she was started on ampicillin, UA was negative.  She was referred to [facility].  Patient was seen by Dr. Tiburcio Pea on 1/27, transvaginal ultrasound was unremarkable with IUD in place.  She was found to have Candida vaginitis and was treated with fluconazole.  Patient is referred to GI by Dr. Tiburcio Pea, OB/GYN for further management.  Labs revealed normal WBC count.  CMP was normal.  Patient reports that her symptoms are persistent, currently having 3 nonbloody, yellow-colored watery bowel movements, lower abdominal pain and bloating/gas. She generally follows well-balanced diet, may eat out once a week, denies consumption of frozen or expired meat products. She is currently finishing the course of ampicillin. She smokes half pack per day for several years. She reports that her mom has history of celiac, colitis and ileitis and she is on a gluten-free diet.  NSAIDs: None  Antiplts/Anticoagulants/Anti thrombotics: None  GI Procedures: None  Past Medical History:  Diagnosis Date  . Anxiety   . Bipolar 1 disorder (HCC)   . Depression   . Diabetes mellitus without complication (HCC)   . Gestational diabetes   . Insomnia      Past Surgical History:  Procedure Laterality Date  . APPENDECTOMY    . CESAREAN SECTION    . HERNIA REPAIR    . TUBAL LIGATION Bilateral 07/03/2016   Procedure: POST PARTUM TUBAL LIGATION BY SALPINGECTOMY;  Surgeon: Nadara Mustard, MD;  Location: [facility] ORS;  Service: Gynecology;  Laterality: Bilateral;    Current Outpatient Medications:  .  ampicillin (PRINCIPEN) 500 MG capsule, Take 1 capsule (500 mg total) by mouth 4 (four) times daily., Disp: 28 capsule, Rfl: 0 .  ARIPiprazole (ABILIFY) 5 MG tablet, Take 5 mg by mouth daily., Disp: , Rfl:  .  diazepam (VALIUM) 5 MG tablet, Take 5 mg by mouth every 6 (six) hours as needed for anxiety., Disp: , Rfl:  .  metFORMIN (GLUCOPHAGE) 500 MG tablet, , Disp: , Rfl:  .  mirtazapine (REMERON) 15 MG tablet, , Disp: , Rfl: 2 .  omeprazole (PRILOSEC) 20 MG capsule, Take 1 capsule (20 mg total) by mouth daily., Disp: 30 capsule, Rfl: 11 .  ondansetron (ZOFRAN ODT) 4 MG disintegrating tablet, Take 1 tablet (4 mg total) by mouth every 8 (eight) hours as needed for nausea or vomiting., Disp: 20 tablet, Rfl: 0   Family History  Problem Relation Age of Onset  . Diabetes Paternal Aunt   . Diabetes Paternal Uncle   . Diabetes Maternal Grandmother   . Heart disease Maternal Grandmother   . Hypertension Maternal Grandmother   . Colon cancer Maternal Grandmother   . Diabetes Maternal Grandfather   . Ovarian  cancer Cousin 25       ? accurate family history  . Uterine cancer Cousin      Social History   Tobacco Use  . Smoking status: Current Every Day Smoker    Packs/day: 0.50    Types: Cigarettes  . Smokeless tobacco: Never Used  Vaping Use  . Vaping Use: Never used  Substance Use Topics  . Alcohol use: No  . Drug use: No    Allergies as of 09/06/2020  . (No Known Allergies)    Review of Systems:    All systems reviewed and negative except where noted in HPI.   Physical Exam:  BP 114/78 (BP Location: Left Arm, Patient Position:  Sitting, Cuff Size: Normal)   Pulse 80   Temp 98.3 F (36.8 C) (Oral)   Ht 5' (1.524 m)   Wt 134 lb 8 oz (61 kg)   BMI 26.27 kg/m  No LMP recorded. (Menstrual status: IUD).  General:   Alert,  Well-developed, well-nourished, pleasant and cooperative in NAD Head:  Normocephalic and atraumatic. Eyes:  Sclera clear, no icterus.   Conjunctiva pink. Ears:  Normal auditory acuity. Nose:  No deformity, discharge, or lesions. Mouth:  No deformity or lesions,oropharynx pink & moist. Neck:  Supple; no masses or thyromegaly. Lungs:  Respirations even and unlabored.  Clear throughout to auscultation.   No wheezes, crackles, or rhonchi. No acute distress. Heart:  Regular rate and rhythm; no murmurs, clicks, rubs, or gallops. Abdomen:  Normal bowel sounds. Soft, mild lower abdominal tenderness and mildly distended, tympanic without masses, hepatosplenomegaly or hernias noted.  No guarding or rebound tenderness.   Rectal: Not performed Msk:  Symmetrical without gross deformities. Good, equal movement & strength bilaterally. Pulses:  Normal pulses noted. Extremities:  No clubbing or edema.  No cyanosis. Neurologic:  Alert and oriented x3;  grossly normal neurologically. Skin:  Intact without significant lesions or rashes. No jaundice. Psych:  Alert and cooperative. Normal mood and affect.  Imaging Studies: Reviewed  Assessment and Plan:   Frances Sanders is a 40 y.o. female with no significant past medical history is seen in consultation for approximately 10 days history of nonbloody diarrhea and lower abdominal pain. Labs revealed no leukocytosis. Family history of ? Celiac disease, IBD  Recommend stool studies to rule out infection. If this is negative, recommend colonoscopy with TI evaluation to rule out IBD, also check celiac panel  Follow up in 2 months   Arlyss Repress, MD

## 2020-09-07 ENCOUNTER — Other Ambulatory Visit: Payer: Self-pay | Admitting: Obstetrics & Gynecology

## 2020-09-07 DIAGNOSIS — R197 Diarrhea, unspecified: Secondary | ICD-10-CM | POA: Diagnosis not present

## 2020-09-07 DIAGNOSIS — K591 Functional diarrhea: Secondary | ICD-10-CM | POA: Diagnosis not present

## 2020-09-07 MED ORDER — TERCONAZOLE 0.8 % VA CREA: 1.0000 | TOPICAL_CREAM | Freq: Every day | VAGINAL | 1 refills | Status: AC

## 2020-09-07 MED ORDER — TERCONAZOLE 0.4 % VA CREA
1.0000 | TOPICAL_CREAM | Freq: Every day | VAGINAL | 1 refills | Status: DC
Start: 2020-09-07 — End: 2020-09-07

## 2020-09-07 NOTE — Telephone Encounter (Signed)
Can you send in a refill?

## 2020-09-11 LAB — GI PROFILE, STOOL, PCR

## 2020-09-12 ENCOUNTER — Telehealth: Payer: Self-pay

## 2020-09-12 DIAGNOSIS — R197 Diarrhea, unspecified: Secondary | ICD-10-CM

## 2020-09-12 NOTE — Telephone Encounter (Signed)
Patient verbalized understanding of results. She will come to our office to have lab work done and order the labs

## 2020-09-12 NOTE — Telephone Encounter (Signed)
-----   Message from Toney Reil, MD sent at 09/12/2020 10:02 AM EST ----- Stool studies negative for infection, recommend to check celiac panel and H Pylori breath test, If these are negative, recommend colonoscopy  RV

## 2020-09-12 NOTE — Telephone Encounter (Signed)
Error

## 2020-09-16 LAB — H. PYLORI BREATH TEST: H pylori Breath Test: NEGATIVE

## 2020-09-16 LAB — CELIAC DISEASE PANEL
Endomysial IgA: NEGATIVE
IgA/Immunoglobulin A, Serum: 229 mg/dL (ref 87–352)
Transglutaminase IgA: <2 U/mL (ref 0–3)

## 2020-11-01 ENCOUNTER — Ambulatory Visit (INDEPENDENT_AMBULATORY_CARE_PROVIDER_SITE_OTHER): Payer: Medicare Other | Admitting: Family Medicine

## 2020-11-01 ENCOUNTER — Encounter: Payer: Self-pay | Admitting: Family Medicine

## 2020-11-01 VITALS — BP 141/84 | HR 116 | Temp 98.0°F | Ht 60.0 in | Wt 124.6 lb

## 2020-11-01 DIAGNOSIS — R059 Cough, unspecified: Secondary | ICD-10-CM

## 2020-11-01 DIAGNOSIS — J3489 Other specified disorders of nose and nasal sinuses: Secondary | ICD-10-CM

## 2020-11-01 DIAGNOSIS — Z20822 Contact with and (suspected) exposure to covid-19: Secondary | ICD-10-CM | POA: Diagnosis not present

## 2020-11-01 HISTORY — DX: Other specified disorders of nose and nasal sinuses: J34.89

## 2020-11-01 LAB — POC COVID19 BINAXNOW: SARS Coronavirus 2 Ag: NEGATIVE

## 2020-11-01 MED ORDER — AZITHROMYCIN 250 MG PO TABS: ORAL_TABLET | ORAL | 0 refills | Status: DC

## 2020-11-01 MED ORDER — BENZONATATE 200 MG PO CAPS: 200.0000 mg | ORAL_CAPSULE | Freq: Two times a day (BID) | ORAL | 0 refills | Status: DC | PRN

## 2020-11-01 MED ORDER — FLUCONAZOLE 150 MG PO TABS: 150.0000 mg | ORAL_TABLET | Freq: Once | ORAL | 0 refills | Status: AC

## 2020-11-01 NOTE — Assessment & Plan Note (Signed)
Patient with cough, ear pain and sinus pressure x 2 days with low grade fever. Her 40 year old has had similar sx x 5 days. No acute distress.   Plan- Symptomatic treatment with cough and sinus meds, rx Zithro dose pack to fill if not better in 4 days.

## 2020-11-01 NOTE — Progress Notes (Signed)
Established Patient Office Visit  SUBJECTIVE:  Subjective  Patient ID: Frances Sanders, female    DOB: Aug 05, 1980  Age: 40 y.o. MRN: [id number]  CC:  Chief Complaint  Patient presents with  . Sore Throat    HPI Frances Sanders is a 40 y.o. female presenting today for     Past Medical History:  Diagnosis Date  . Anxiety   . Bipolar 1 disorder (HCC)   . Depression   . Diabetes mellitus without complication (HCC)   . Gestational diabetes   . Insomnia     Past Surgical History:  Procedure Laterality Date  . APPENDECTOMY    . CESAREAN SECTION    . HERNIA REPAIR    . TUBAL LIGATION Bilateral 07/03/2016   Procedure: POST PARTUM TUBAL LIGATION BY SALPINGECTOMY;  Surgeon: Nadara Mustard, MD;  Location: [facility] ORS;  Service: Gynecology;  Laterality: Bilateral;    Family History  Problem Relation Age of Onset  . Diabetes Paternal Aunt   . Diabetes Paternal Uncle   . Diabetes Maternal Grandmother   . Heart disease Maternal Grandmother   . Hypertension Maternal Grandmother   . Colon cancer Maternal Grandmother   . Diabetes Maternal Grandfather   . Ovarian cancer Cousin 25       ? accurate family history  . Uterine cancer Cousin     Social History   Socioeconomic History  . Marital status: Married    Spouse name: Not on file  . Number of children: Not on file  . Years of education: Not on file  . Highest education level: Not on file  Occupational History  . Not on file  Tobacco Use  . Smoking status: Current Every Day Smoker    Packs/day: 0.50    Types: Cigarettes  . Smokeless tobacco: Never Used  Vaping Use  . Vaping Use: Never used  Substance and Sexual Activity  . Alcohol use: No  . Drug use: No  . Sexual activity: Yes    Birth control/protection: Injection  Other Topics Concern  . Not on file  Social History Narrative  . Not on file   Social Determinants of Health   Financial Resource Strain: Not on file  Food Insecurity: Not on file   Transportation Needs: Not on file  Physical Activity: Not on file  Stress: Not on file  Social Connections: Not on file  Intimate Partner Violence: Not on file     Current Outpatient Medications:  .  azithromycin (ZITHROMAX) 250 MG tablet, 2 tabs on day 1 then 1 tab daily x 4 days, Disp: 6 tablet, Rfl: 0 .  benzonatate (TESSALON) 200 MG capsule, Take 1 capsule (200 mg total) by mouth 2 (two) times daily as needed for cough., Disp: 20 capsule, Rfl: 0 .  fluconazole (DIFLUCAN) 150 MG tablet, Take 1 tablet (150 mg total) by mouth once for 1 dose., Disp: 1 tablet, Rfl: 0 .  ampicillin (PRINCIPEN) 500 MG capsule, Take 1 capsule (500 mg total) by mouth 4 (four) times daily., Disp: 28 capsule, Rfl: 0 .  ARIPiprazole (ABILIFY) 5 MG tablet, Take 5 mg by mouth daily., Disp: , Rfl:  .  diazepam (VALIUM) 5 MG tablet, Take 5 mg by mouth every 6 (six) hours as needed for anxiety., Disp: , Rfl:  .  metFORMIN (GLUCOPHAGE) 500 MG tablet, , Disp: , Rfl:  .  mirtazapine (REMERON) 15 MG tablet, , Disp: , Rfl: 2 .  omeprazole (PRILOSEC) 20 MG capsule, Take 1 capsule (  20 mg total) by mouth daily., Disp: 30 capsule, Rfl: 11 .  ondansetron (ZOFRAN ODT) 4 MG disintegrating tablet, Take 1 tablet (4 mg total) by mouth every 8 (eight) hours as needed for nausea or vomiting., Disp: 20 tablet, Rfl: 0   No Known Allergies  ROS Review of Systems  Constitutional: Positive for fatigue.  HENT: Positive for congestion, ear pain, postnasal drip, rhinorrhea, sinus pain and sneezing.   Respiratory: Positive for cough and wheezing.   Genitourinary: Negative.   Musculoskeletal: Negative.   Psychiatric/Behavioral: Negative.      OBJECTIVE:    Physical Exam Vitals and nursing note reviewed.  HENT:     Right Ear: Tympanic membrane normal.     Left Ear: Tympanic membrane normal.     Mouth/Throat:     Mouth: Mucous membranes are dry.  Cardiovascular:     Rate and Rhythm: Normal rate and regular rhythm.  Skin:     General: Skin is warm.  Neurological:     Mental Status: She is alert.     BP (!) 141/84   Pulse (!) 116   Temp 98 F (36.7 C)   Ht 5' (1.524 m)   Wt 124 lb 9.6 oz (56.5 kg)   BMI 24.33 kg/m  Wt Readings from Last 3 Encounters:  11/01/20 124 lb 9.6 oz (56.5 kg)  09/06/20 134 lb 8 oz (61 kg)  08/30/20 137 lb (62.1 kg)    Health Maintenance Due  Topic Date Due  . Hepatitis C Screening  Never done  . COVID-19 Vaccine (1) Never done  . HIV Screening  Never done  . TETANUS/TDAP  Never done  . INFLUENZA VACCINE  Never done    There are no preventive care reminders to display for this patient.  CBC Latest Ref Rng & Units 08/30/2020 01/31/2020 07/03/2016  WBC 3.8 - 10.8 Thousand/uL 7.7 6.8 17.2(H)  Hemoglobin 11.7 - 15.5 g/dL 83.3 82.5 10.9(L)  Hematocrit 35.0 - 45.0 % 43.1 41.0 31.1(L)  Platelets 140 - 400 Thousand/uL 369 289 302   CMP Latest Ref Rng & Units 08/30/2020 01/31/2020 01/21/2016  Glucose 65 - 99 mg/dL 86 89 053(Z)  BUN 7 - 25 mg/dL 6(L) 7 <7(Q)  Creatinine 0.50 - 1.10 mg/dL 7.34 1.93 7.90(W)  Sodium 135 - 146 mmol/L 138 140 133(L)  Potassium 3.5 - 5.3 mmol/L 4.4 3.7 3.2(L)  Chloride 98 - 110 mmol/L 105 104 104  CO2 20 - 32 mmol/L 19(L) 27 20(L)  Calcium 8.6 - 10.2 mg/dL 9.3 8.9 9.1  Total Protein 6.1 - 8.1 g/dL 6.8 7.1 6.8  Total Bilirubin 0.2 - 1.2 mg/dL 0.5 0.7 0.4  Alkaline Phos 38 - 126 U/L - 76 65  AST 10 - 30 U/L 28 16 23   ALT 6 - 29 U/L 27 13 14     Lab Results  Component Value Date   TSH 2.030 09/13/2019   Lab Results  Component Value Date   ALBUMIN 4.5 01/31/2020   ANIONGAP 9 01/31/2020   Lab Results  Component Value Date   CHOL 166 09/13/2019   CHOL 168 09/07/2015   HDL 47 09/13/2019   HDL 45 09/07/2015   LDLCALC 91 09/13/2019   LDLCALC 102 (H) 09/07/2015   CHOLHDL 3.5 09/13/2019   CHOLHDL 3.7 09/07/2015   Lab Results  Component Value Date   TRIG 159 (H) 09/13/2019   Lab Results  Component Value Date   HGBA1C 5.5 09/07/2015       ASSESSMENT & PLAN:  Problem List Items Addressed This Visit      Other   Cough    Patient with cough, ear pain and sinus pressure x 2 days with low grade fever. Her 40 year old has had similar sx x 5 days. No acute distress.   Plan- Symptomatic treatment with cough and sinus meds, rx Zithro dose pack to fill if not better in 4 days.       Sinus drainage    Other Visit Diagnoses    Suspected COVID-19 virus infection    -  Primary   Relevant Orders   POC COVID-19 (Completed)      Meds ordered this encounter  Medications  . benzonatate (TESSALON) 200 MG capsule    Sig: Take 1 capsule (200 mg total) by mouth 2 (two) times daily as needed for cough.    Dispense:  20 capsule    Refill:  0  . azithromycin (ZITHROMAX) 250 MG tablet    Sig: 2 tabs on day 1 then 1 tab daily x 4 days    Dispense:  6 tablet    Refill:  0  . fluconazole (DIFLUCAN) 150 MG tablet    Sig: Take 1 tablet (150 mg total) by mouth once for 1 dose.    Dispense:  1 tablet    Refill:  0      Follow-up: No follow-ups on file.    Irish Lack, FNP [facility] [address] [place], [place] [address]

## 2020-11-21 ENCOUNTER — Ambulatory Visit: Payer: Medicare Other | Admitting: Gastroenterology

## 2020-11-27 DIAGNOSIS — M9901 Segmental and somatic dysfunction of cervical region: Secondary | ICD-10-CM | POA: Diagnosis not present

## 2020-11-27 DIAGNOSIS — M5412 Radiculopathy, cervical region: Secondary | ICD-10-CM | POA: Diagnosis not present

## 2020-11-27 DIAGNOSIS — M9902 Segmental and somatic dysfunction of thoracic region: Secondary | ICD-10-CM | POA: Diagnosis not present

## 2020-11-27 DIAGNOSIS — M546 Pain in thoracic spine: Secondary | ICD-10-CM | POA: Diagnosis not present

## 2020-11-28 DIAGNOSIS — M9902 Segmental and somatic dysfunction of thoracic region: Secondary | ICD-10-CM | POA: Diagnosis not present

## 2020-11-28 DIAGNOSIS — M5412 Radiculopathy, cervical region: Secondary | ICD-10-CM | POA: Diagnosis not present

## 2020-11-28 DIAGNOSIS — M546 Pain in thoracic spine: Secondary | ICD-10-CM | POA: Diagnosis not present

## 2020-11-28 DIAGNOSIS — M9901 Segmental and somatic dysfunction of cervical region: Secondary | ICD-10-CM | POA: Diagnosis not present

## 2020-12-03 DIAGNOSIS — M9901 Segmental and somatic dysfunction of cervical region: Secondary | ICD-10-CM | POA: Diagnosis not present

## 2020-12-03 DIAGNOSIS — M5412 Radiculopathy, cervical region: Secondary | ICD-10-CM | POA: Diagnosis not present

## 2020-12-03 DIAGNOSIS — M546 Pain in thoracic spine: Secondary | ICD-10-CM | POA: Diagnosis not present

## 2020-12-03 DIAGNOSIS — M9902 Segmental and somatic dysfunction of thoracic region: Secondary | ICD-10-CM | POA: Diagnosis not present

## 2020-12-05 DIAGNOSIS — M9901 Segmental and somatic dysfunction of cervical region: Secondary | ICD-10-CM | POA: Diagnosis not present

## 2020-12-05 DIAGNOSIS — M546 Pain in thoracic spine: Secondary | ICD-10-CM | POA: Diagnosis not present

## 2020-12-05 DIAGNOSIS — M9902 Segmental and somatic dysfunction of thoracic region: Secondary | ICD-10-CM | POA: Diagnosis not present

## 2020-12-05 DIAGNOSIS — M5412 Radiculopathy, cervical region: Secondary | ICD-10-CM | POA: Diagnosis not present

## 2020-12-10 DIAGNOSIS — M5412 Radiculopathy, cervical region: Secondary | ICD-10-CM | POA: Diagnosis not present

## 2020-12-10 DIAGNOSIS — M546 Pain in thoracic spine: Secondary | ICD-10-CM | POA: Diagnosis not present

## 2020-12-10 DIAGNOSIS — M9901 Segmental and somatic dysfunction of cervical region: Secondary | ICD-10-CM | POA: Diagnosis not present

## 2020-12-10 DIAGNOSIS — M9902 Segmental and somatic dysfunction of thoracic region: Secondary | ICD-10-CM | POA: Diagnosis not present

## 2020-12-12 DIAGNOSIS — M5412 Radiculopathy, cervical region: Secondary | ICD-10-CM | POA: Diagnosis not present

## 2020-12-12 DIAGNOSIS — M9901 Segmental and somatic dysfunction of cervical region: Secondary | ICD-10-CM | POA: Diagnosis not present

## 2020-12-12 DIAGNOSIS — M546 Pain in thoracic spine: Secondary | ICD-10-CM | POA: Diagnosis not present

## 2020-12-12 DIAGNOSIS — M9902 Segmental and somatic dysfunction of thoracic region: Secondary | ICD-10-CM | POA: Diagnosis not present

## 2020-12-17 DIAGNOSIS — M9902 Segmental and somatic dysfunction of thoracic region: Secondary | ICD-10-CM | POA: Diagnosis not present

## 2020-12-17 DIAGNOSIS — M546 Pain in thoracic spine: Secondary | ICD-10-CM | POA: Diagnosis not present

## 2020-12-17 DIAGNOSIS — M5412 Radiculopathy, cervical region: Secondary | ICD-10-CM | POA: Diagnosis not present

## 2020-12-17 DIAGNOSIS — M9901 Segmental and somatic dysfunction of cervical region: Secondary | ICD-10-CM | POA: Diagnosis not present

## 2020-12-19 ENCOUNTER — Ambulatory Visit: Payer: Medicare Other | Admitting: Gastroenterology

## 2020-12-19 ENCOUNTER — Other Ambulatory Visit: Payer: Self-pay

## 2020-12-19 ENCOUNTER — Encounter: Payer: Self-pay | Admitting: Gastroenterology

## 2020-12-19 VITALS — BP 116/79 | HR 85 | Temp 97.4°F | Wt 119.1 lb

## 2020-12-19 DIAGNOSIS — R1031 Right lower quadrant pain: Secondary | ICD-10-CM | POA: Diagnosis not present

## 2020-12-19 DIAGNOSIS — G8929 Other chronic pain: Secondary | ICD-10-CM | POA: Diagnosis not present

## 2020-12-19 DIAGNOSIS — R140 Abdominal distension (gaseous): Secondary | ICD-10-CM | POA: Diagnosis not present

## 2020-12-19 MED ORDER — NA SULFATE-K SULFATE-MG SULF 17.5-3.13-1.6 GM/177ML PO SOLN: 354.0000 mL | Freq: Once | ORAL | 0 refills | Status: AC

## 2020-12-19 NOTE — Progress Notes (Signed)
Arlyss Repress, MD [address]  [address]  [place], [place] [address]  Main: [phone]  Fax: [phone]    Gastroenterology Consultation  Referring Provider:     Corky Downs, MD Primary Care Physician:  Corky Downs, MD Primary Gastroenterologist:  Dr. Arlyss Repress Reason for Consultation:  Lower abdominal pain, acute diarrhea        HPI:   Frances Sanders is a 40 y.o. female referred by Dr. Corky Downs, MD  for consultation & management of acute onset of diarrhea and lower abdominal pain.  Patient was originally seen by her PCP on 1/27 for acute onset of bilateral lower abdominal pain and acute nonbloody diarrhea, nausea, chills.  Patient has tried antibiotics in the past for similar symptoms.  There was concern about pelvic inflammatory disease and she was started on ampicillin, UA was negative.  She was referred to [facility]  Patient was seen by Dr. Tiburcio Pea on 1/27, transvaginal ultrasound was unremarkable with IUD in place.  She was found to have Candida vaginitis and was treated with fluconazole.  Patient is referred to GI by Dr. Tiburcio Pea, OB/GYN for further management.  Labs revealed normal WBC count.  CMP was normal.  Patient reports that her symptoms are persistent, currently having 3 nonbloody, yellow-colored watery bowel movements, lower abdominal pain and bloating/gas. She generally follows well-balanced diet, may eat out once a week, denies consumption of frozen or expired meat products. She is currently finishing the course of ampicillin. She smokes half pack per day for several years. She reports that her mom has history of celiac, colitis and ileitis and she is on a gluten-free diet.  Follow-up visit 12/19/2020 Patient is here for follow-up of her lower GI symptoms.  She reports that her diarrhea has resolved.  Stools are mushy, nonbloody, about once a day in the morning.  She does report abdominal bloating and ongoing right lower quadrant pain.  She was seen  by OB/GYN, pelvic inflammatory disease was ruled out.  She gave up unwanted use, patient lost about 18 pounds since end of January to date.  She states she is trying to eat healthy, 3 meals a day.  She continues to smoke half pack per day.  Stool studies negative for infection including C. difficile, celiac panel negative, H. pylori breath test negative  NSAIDs: None  Antiplts/Anticoagulants/Anti thrombotics: None  GI Procedures: None  Past Medical History:  Diagnosis Date  . Anxiety   . Bipolar 1 disorder (HCC)   . Depression   . Diabetes mellitus without complication (HCC)   . Gestational diabetes   . Insomnia     Past Surgical History:  Procedure Laterality Date  . APPENDECTOMY    . CESAREAN SECTION    . HERNIA REPAIR    . TUBAL LIGATION Bilateral 07/03/2016   Procedure: POST PARTUM TUBAL LIGATION BY SALPINGECTOMY;  Surgeon: Nadara Mustard, MD;  Location: [facility] ORS;  Service: Gynecology;  Laterality: Bilateral;    Current Outpatient Medications:  .  ARIPiprazole (ABILIFY) 5 MG tablet, Take 5 mg by mouth daily., Disp: , Rfl:  .  diazepam (VALIUM) 5 MG tablet, Take 5 mg by mouth every 6 (six) hours as needed for anxiety., Disp: , Rfl:  .  metFORMIN (GLUCOPHAGE) 500 MG tablet, , Disp: , Rfl:  .  mirtazapine (REMERON) 15 MG tablet, , Disp: , Rfl: 2 .  Na Sulfate-K Sulfate-Mg Sulf 17.5-3.13-1.6 GM/177ML SOLN, Take 354 mLs by mouth once for 1 dose., Disp: 354  mL, Rfl: 0 .  omeprazole (PRILOSEC) 20 MG capsule, Take 1 capsule (20 mg total) by mouth daily., Disp: 30 capsule, Rfl: 11 .  ondansetron (ZOFRAN ODT) 4 MG disintegrating tablet, Take 1 tablet (4 mg total) by mouth every 8 (eight) hours as needed for nausea or vomiting., Disp: 20 tablet, Rfl: 0   Family History  Problem Relation Age of Onset  . Diabetes Paternal Aunt   . Diabetes Paternal Uncle   . Diabetes Maternal Grandmother   . Heart disease Maternal Grandmother   . Hypertension Maternal Grandmother   . Colon cancer  Maternal Grandmother   . Diabetes Maternal Grandfather   . Ovarian cancer Cousin 25       ? accurate family history  . Uterine cancer Cousin      Social History   Tobacco Use  . Smoking status: Current Every Day Smoker    Packs/day: 0.50    Types: Cigarettes  . Smokeless tobacco: Never Used  Vaping Use  . Vaping Use: Never used  Substance Use Topics  . Alcohol use: No  . Drug use: No    Allergies as of 12/19/2020  . (No Known Allergies)    Review of Systems:    All systems reviewed and negative except where noted in HPI.   Physical Exam:  BP 116/79 (BP Location: Left Arm, Patient Position: Sitting, Cuff Size: Normal)   Pulse 85   Temp (!) 97.4 F (36.3 C) (Oral)   Wt 119 lb 2 oz (54 kg)   BMI 23.27 kg/m  No LMP recorded. (Menstrual status: IUD).  General:   Alert,  Well-developed, well-nourished, pleasant and cooperative in NAD Head:  Normocephalic and atraumatic. Eyes:  Sclera clear, no icterus.   Conjunctiva pink. Ears:  Normal auditory acuity. Nose:  No deformity, discharge, or lesions. Mouth:  No deformity or lesions,oropharynx pink & moist. Neck:  Supple; no masses or thyromegaly. Lungs:  Respirations even and unlabored.  Clear throughout to auscultation.   No wheezes, crackles, or rhonchi. No acute distress. Heart:  Regular rate and rhythm; no murmurs, clicks, rubs, or gallops. Abdomen:  Normal bowel sounds. Soft, right lower quadrant tenderness and mildly distended, tympanic without masses, hepatosplenomegaly or hernias noted.  No guarding or rebound tenderness.   Rectal: Not performed Msk:  Symmetrical without gross deformities. Good, equal movement & strength bilaterally. Pulses:  Normal pulses noted. Extremities:  No clubbing or edema.  No cyanosis. Neurologic:  Alert and oriented x3;  grossly normal neurologically. Skin:  Intact without significant lesions or rashes. No jaundice. Psych:  Alert and cooperative. Normal mood and affect.  Imaging  Studies: Reviewed  Assessment and Plan:   [name] is a 40 y.o. female with no significant past medical history is seen in for follow-up of acute nonbloody diarrhea and lower abdominal pain. Labs revealed no leukocytosis. Family history of ? Celiac disease, IBD. Stool studies negative for infection, celiac panel and H. pylori breath test negative.  Due to ongoing right lower quadrant pain, significant weight loss and abdominal distention, recommend diagnostic colonoscopy with TI evaluation to rule out IBD.  Can use pediatric colonoscope  Follow up based on the colonoscopy results   Arlyss Repress, MD

## 2020-12-24 ENCOUNTER — Other Ambulatory Visit: Payer: Self-pay | Admitting: Obstetrics & Gynecology

## 2020-12-24 ENCOUNTER — Encounter: Payer: Self-pay | Admitting: Gastroenterology

## 2020-12-24 MED ORDER — PEG 3350-KCL-NA BICARB-NACL 420 G PO SOLR: 4000.0000 mL | Freq: Once | ORAL | 0 refills | Status: AC

## 2021-01-09 ENCOUNTER — Encounter: Payer: Self-pay | Admitting: Gastroenterology

## 2021-01-24 ENCOUNTER — Other Ambulatory Visit: Payer: Self-pay

## 2021-01-24 ENCOUNTER — Encounter: Payer: Self-pay | Admitting: Gastroenterology

## 2021-01-24 ENCOUNTER — Ambulatory Visit: Payer: Medicare Other | Admitting: Anesthesiology

## 2021-01-24 ENCOUNTER — Ambulatory Visit
Admission: RE | Admit: 2021-01-24 | Discharge: 2021-01-24 | Disposition: A | Discharge status: Home / Self Care | Payer: Medicare Other | Attending: Gastroenterology | Admitting: Gastroenterology

## 2021-01-24 ENCOUNTER — Encounter
Admission: RE | Disposition: A | Discharge status: Home / Self Care | Payer: Self-pay | Source: Home / Self Care | Attending: Gastroenterology

## 2021-01-24 DIAGNOSIS — R1031 Right lower quadrant pain: Secondary | ICD-10-CM | POA: Diagnosis not present

## 2021-01-24 DIAGNOSIS — Z6822 Body mass index (BMI) 22.0-22.9, adult: Secondary | ICD-10-CM | POA: Insufficient documentation

## 2021-01-24 DIAGNOSIS — K635 Polyp of colon: Secondary | ICD-10-CM

## 2021-01-24 DIAGNOSIS — K621 Rectal polyp: Secondary | ICD-10-CM | POA: Diagnosis not present

## 2021-01-24 DIAGNOSIS — F17210 Nicotine dependence, cigarettes, uncomplicated: Secondary | ICD-10-CM | POA: Diagnosis not present

## 2021-01-24 DIAGNOSIS — Z79899 Other long term (current) drug therapy: Secondary | ICD-10-CM | POA: Diagnosis not present

## 2021-01-24 DIAGNOSIS — Z800 Family history of malignant neoplasm of digestive organs: Secondary | ICD-10-CM | POA: Diagnosis not present

## 2021-01-24 DIAGNOSIS — R634 Abnormal weight loss: Secondary | ICD-10-CM

## 2021-01-24 DIAGNOSIS — R140 Abdominal distension (gaseous): Secondary | ICD-10-CM | POA: Diagnosis not present

## 2021-01-24 DIAGNOSIS — R197 Diarrhea, unspecified: Secondary | ICD-10-CM | POA: Diagnosis not present

## 2021-01-24 DIAGNOSIS — K625 Hemorrhage of anus and rectum: Secondary | ICD-10-CM | POA: Insufficient documentation

## 2021-01-24 DIAGNOSIS — Z7984 Long term (current) use of oral hypoglycemic drugs: Secondary | ICD-10-CM | POA: Insufficient documentation

## 2021-01-24 HISTORY — PX: COLONOSCOPY WITH PROPOFOL: SHX5780

## 2021-01-24 HISTORY — PX: BIOPSY: SHX5522

## 2021-01-24 HISTORY — PX: POLYPECTOMY: SHX5525

## 2021-01-24 HISTORY — DX: Paranoid schizophrenia: F20.0

## 2021-01-24 LAB — GLUCOSE, CAPILLARY
Glucose-Capillary: 98 mg/dL (ref 70–99)
Glucose-Capillary: 107 mg/dL — ABNORMAL HIGH (ref 70–99)

## 2021-01-24 LAB — POCT PREGNANCY, URINE: Preg Test, Ur: NEGATIVE

## 2021-01-24 SURGERY — COLONOSCOPY WITH PROPOFOL
Anesthesia: General | Site: Rectum

## 2021-01-24 MED ORDER — LIDOCAINE HCL (CARDIAC) PF 100 MG/5ML IV SOSY
PREFILLED_SYRINGE | INTRAVENOUS | Status: DC | PRN
  Administered 2021-01-24: 30 mg via INTRAVENOUS

## 2021-01-24 MED ORDER — SODIUM CHLORIDE 0.9 % IV SOLN: INTRAVENOUS | Status: DC

## 2021-01-24 MED ORDER — LACTATED RINGERS IV SOLN: INTRAVENOUS | Status: DC

## 2021-01-24 MED ORDER — HYDROCORTISONE (PERIANAL) 2.5 % EX CREA: TOPICAL_CREAM | Freq: Three times a day (TID) | CUTANEOUS | 1 refills | Status: AC

## 2021-01-24 MED ORDER — STERILE WATER FOR IRRIGATION IR SOLN
Status: DC | PRN
  Administered 2021-01-24: 100 mL

## 2021-01-24 MED ORDER — PROPOFOL 10 MG/ML IV BOLUS
INTRAVENOUS | Status: DC | PRN
  Administered 2021-01-24 (×3): 30 mg via INTRAVENOUS
  Administered 2021-01-24: 150 mg via INTRAVENOUS
  Administered 2021-01-24 (×2): 20 mg via INTRAVENOUS

## 2021-01-24 SURGICAL SUPPLY — 26 items
CLIP HMST 235XBRD CATH ROT (MISCELLANEOUS) IMPLANT
CLIP RESOLUTION 360 11X235 (MISCELLANEOUS)
ELECT REM PT RETURN 9FT ADLT (ELECTROSURGICAL)
ELECTRODE REM PT RTRN 9FT ADLT (ELECTROSURGICAL) IMPLANT
FCP ESCP3.2XJMB 240X2.8X (MISCELLANEOUS)
FORCEPS BIOP RAD 4 LRG CAP 4 (CUTTING FORCEPS) ×2 IMPLANT
FORCEPS BIOP RJ4 240 W/NDL (MISCELLANEOUS)
FORCEPS ESCP3.2XJMB 240X2.8X (MISCELLANEOUS) IMPLANT
GOWN CVR UNV OPN BCK APRN NK (MISCELLANEOUS) ×2 IMPLANT
GOWN ISOL THUMB LOOP REG UNIV (MISCELLANEOUS) ×4
INJECTOR VARIJECT VIN23 (MISCELLANEOUS) IMPLANT
KIT DEFENDO VALVE AND CONN (KITS) IMPLANT
KIT PRC NS LF DISP ENDO (KITS) ×1 IMPLANT
KIT PROCEDURE OLYMPUS (KITS) ×2
MANIFOLD NEPTUNE II (INSTRUMENTS) ×2 IMPLANT
MARKER SPOT ENDO TATTOO 5ML (MISCELLANEOUS) IMPLANT
PROBE APC STR FIRE (PROBE) IMPLANT
RETRIEVER NET ROTH 2.5X230 LF (MISCELLANEOUS) IMPLANT
SNARE COLD EXACTO (MISCELLANEOUS) ×2 IMPLANT
SNARE SHORT THROW 13M SML OVAL (MISCELLANEOUS) IMPLANT
SNARE SHORT THROW 30M LRG OVAL (MISCELLANEOUS) IMPLANT
SNARE SNG USE RND 15MM (INSTRUMENTS) IMPLANT
SPOT EX ENDOSCOPIC TATTOO (MISCELLANEOUS)
TRAP ETRAP POLY (MISCELLANEOUS) ×2 IMPLANT
VARIJECT INJECTOR VIN23 (MISCELLANEOUS)
WATER STERILE IRR 250ML POUR (IV SOLUTION) ×2 IMPLANT

## 2021-01-24 NOTE — Anesthesia Postprocedure Evaluation (Signed)
Anesthesia Post Note  Patient: Frances Sanders  Procedure(s) Performed: COLONOSCOPY WITH PROPOFOL (Rectum) BIOPSY (Rectum) POLYPECTOMY (Rectum)     Patient location during evaluation: PACU Anesthesia Type: General Level of consciousness: awake and alert Pain management: pain level controlled Vital Signs Assessment: post-procedure vital signs reviewed and stable Respiratory status: spontaneous breathing and nonlabored ventilation Cardiovascular status: blood pressure returned to baseline Postop Assessment: no apparent nausea or vomiting Anesthetic complications: no   No notable events documented.  Charlotte Fidalgo [employer]

## 2021-01-24 NOTE — Anesthesia Procedure Notes (Signed)
Date/Time: 01/24/2021 8:39 AM Performed by: Maree Krabbe, CRNA Pre-anesthesia Checklist: Patient identified, Emergency Drugs available, Suction available, Timeout performed and Patient being monitored Patient Re-evaluated:Patient Re-evaluated prior to induction Oxygen Delivery Method: Nasal cannula Placement Confirmation: positive ETCO2

## 2021-01-24 NOTE — Transfer of Care (Signed)
Immediate Anesthesia Transfer of Care Note  Patient: Frances Sanders  Procedure(s) Performed: COLONOSCOPY WITH PROPOFOL (Rectum) BIOPSY (Rectum) POLYPECTOMY (Rectum)  Patient Location: PACU  Anesthesia Type: General  Level of Consciousness: awake, alert  and patient cooperative  Airway and Oxygen Therapy: Patient Spontanous Breathing and Patient connected to supplemental oxygen  Post-op Assessment: Post-op Vital signs reviewed, Patient's Cardiovascular Status Stable, Respiratory Function Stable, Patent Airway and No signs of Nausea or vomiting  Post-op Vital Signs: Reviewed and stable  Complications: No notable events documented.

## 2021-01-24 NOTE — H&P (Signed)
Arlyss Repress, MD [address]  [address]  [place], [place] [address]  Main: [phone]  Fax: [phone] Pager: [phone]  Primary Care Physician:  Corky Downs, MD Primary Gastroenterologist:  Dr. Arlyss Repress  Pre-Procedure History & Physical: HPI:  Frances Sanders is a 40 y.o. female is here for an colonoscopy.   Past Medical History:  Diagnosis Date   Anxiety    Cough 06/07/2020   Depression    Diabetes mellitus without complication (HCC)    Gestational diabetes    Insomnia    Paranoid schizophrenia (HCC)    Sinus drainage 11/01/2020    Past Surgical History:  Procedure Laterality Date   APPENDECTOMY     HERNIA REPAIR     TUBAL LIGATION Bilateral 07/03/2016   Procedure: POST PARTUM TUBAL LIGATION BY SALPINGECTOMY;  Surgeon: Nadara Mustard, MD;  Location: [facility] ORS;  Service: Gynecology;  Laterality: Bilateral;    Prior to Admission medications   Medication Sig Start Date End Date Taking? Authorizing Provider  diazepam (VALIUM) 5 MG tablet Take 5 mg by mouth every 6 (six) hours as needed for anxiety.   Yes [provider]  metFORMIN (GLUCOPHAGE) 500 MG tablet 500 mg 2 (two) times daily. 09/06/19  Yes [provider]  mirtazapine (REMERON) 15 MG tablet  05/18/18  Yes [provider]  omeprazole (PRILOSEC) 20 MG capsule TAKE 1 CAPSULE BY MOUTH DAILY 12/24/20  Yes Nadara Mustard, MD  promethazine (PHENERGAN) 12.5 MG tablet Take 12.5 mg by mouth every 6 (six) hours as needed for nausea or vomiting.   Yes [provider]  ARIPiprazole (ABILIFY) 5 MG tablet Take 5 mg by mouth daily. Patient not taking: Reported on 01/24/2021 05/23/20   [provider]  ondansetron (ZOFRAN ODT) 4 MG disintegrating tablet Take 1 tablet (4 mg total) by mouth every 8 (eight) hours as needed for nausea or vomiting. 01/31/20   Shaune Pollack, MD    Allergies as of 12/19/2020   (No Known Allergies)    Family History  Problem  Relation Age of Onset   Diabetes Paternal Aunt    Diabetes Paternal Uncle    Diabetes Maternal Grandmother    Heart disease Maternal Grandmother    Hypertension Maternal Grandmother    Colon cancer Maternal Grandmother    Diabetes Maternal Grandfather    Ovarian cancer Cousin 25       ? accurate family history   Uterine cancer Cousin     Social History   Socioeconomic History   Marital status: Married    Spouse name: Not on file   Number of children: Not on file   Years of education: Not on file   Highest education level: Not on file  Occupational History   Not on file  Tobacco Use   Smoking status: Every Day    Packs/day: 0.50    Pack years: 0.00    Types: Cigarettes   Smokeless tobacco: Never  Vaping Use   Vaping Use: Never used  Substance and Sexual Activity   Alcohol use: No   Drug use: No   Sexual activity: Yes    Birth control/protection: Injection  Other Topics Concern   Not on file  Social History Narrative   Not on file   Social Determinants of Health   Financial Resource Strain: Not on file  Food Insecurity: Not on file  Transportation Needs: Not on file  Physical Activity: Not on file  Stress: Not on file  Social  Connections: Not on file  Intimate Partner Violence: Not on file    Review of Systems: See HPI, otherwise negative ROS  Physical Exam: BP 97/60   Pulse 66   Temp 98.2 F (36.8 C) (Temporal)   Ht 5' (1.524 m)   Wt 52.2 kg   LMP  (LMP Unknown)   SpO2 100%   BMI 22.46 kg/m  General:   Alert,  pleasant and cooperative in NAD Head:  Normocephalic and atraumatic. Neck:  Supple; no masses or thyromegaly. Lungs:  Clear throughout to auscultation.    Heart:  Regular rate and rhythm. Abdomen:  Soft, nontender and nondistended. Normal bowel sounds, without guarding, and without rebound.   Neurologic:  Alert and  oriented x4;  grossly normal neurologically.  Impression/Plan: Frances Sanders is here for an colonoscopy to be  performed for RLQ pain, chronic diarrhea, weight loss  Risks, benefits, limitations, and alternatives regarding  colonoscopy have been reviewed with the patient.  Questions have been answered.  All parties agreeable.   Lannette Donath, MD  01/24/2021, 8:16 AM

## 2021-01-24 NOTE — Op Note (Signed)
[facility] Gastroenterology Patient Name: Frances Sanders Procedure Date: 01/24/2021 7:17 AM MRN: [id number] Account #: [id number] Date of Birth: [birth year] Admit Type: Outpatient Age: 40 Room: [facility] OR ROOM 01 Gender: Female Note Status: Finalized Procedure:             Colonoscopy Indications:           Abdominal pain in the right lower quadrant, Clinically                         significant diarrhea of unexplained origin, Rectal                         bleeding, Weight loss Providers:             Toney Reil MD, MD Referring MD:          Corky Downs, MD (Referring MD) Medicines:             General Anesthesia Complications:         No immediate complications. Estimated blood loss: None. Procedure:             Pre-Anesthesia Assessment:                        - Prior to the procedure, a History and Physical was                         performed, and patient medications and allergies were                         reviewed. The patient is competent. The risks and                         benefits of the procedure and the sedation options and                         risks were discussed with the patient. All questions                         were answered and informed consent was obtained.                         Patient identification and proposed procedure were                         verified by the physician, the nurse, the                         anesthesiologist, the anesthetist and the technician                         in the pre-procedure area in the procedure room in the                         endoscopy suite. Mental Status Examination: alert and                         oriented. Airway Examination: normal oropharyngeal  airway and neck mobility. Respiratory Examination:                         clear to auscultation. CV Examination: normal.                         Prophylactic Antibiotics: The patient does not require                          prophylactic antibiotics. Prior Anticoagulants: The                         patient has taken no previous anticoagulant or                         antiplatelet agents. ASA Grade Assessment: II - A                         patient with mild systemic disease. After reviewing                         the risks and benefits, the patient was deemed in                         satisfactory condition to undergo the procedure. The                         anesthesia plan was to use general anesthesia.                         Immediately prior to administration of medications,                         the patient was re-assessed for adequacy to receive                         sedatives. The heart rate, respiratory rate, oxygen                         saturations, blood pressure, adequacy of pulmonary                         ventilation, and response to care were monitored                         throughout the procedure. The physical status of the                         patient was re-assessed after the procedure.                        After obtaining informed consent, the colonoscope was                         passed under direct vision. Throughout the procedure,                         the patient's blood pressure, pulse, and oxygen  saturations were monitored continuously. The                         Colonoscope was introduced through the anus and                         advanced to the 15 cm into the ileum. The colonoscopy                         was performed without difficulty. The patient                         tolerated the procedure well. The quality of the bowel                         preparation was fair. Findings:      The perianal and digital rectal examinations were normal. Pertinent       negatives include normal sphincter tone and no palpable rectal lesions.      The terminal ileum appeared normal. Biopsies were taken with a cold        forceps for histology.      Normal mucosa was found in the left colon and in the right colon.       Biopsies were taken with a cold forceps for histology.      A 5 mm polyp was found in the distal rectum. The polyp was sessile. The       polyp was removed with a cold snare. Resection and retrieval were       complete.      The retroflexed view of the distal rectum and anal verge was normal and       showed no anal or rectal abnormalities. Impression:            - Preparation of the colon was fair.                        - The examined portion of the ileum was normal.                         Biopsied.                        - Normal mucosa in the left colon and in the right                         colon. Biopsied.                        - One 5 mm polyp in the distal rectum, removed with a                         cold snare. Resected and retrieved.                        - The distal rectum and anal verge are normal on                         retroflexion view. Recommendation:        - Discharge patient to home (with parent).                        -  Resume regular diet today.                        - Continue present medications.                        - Await pathology results.                        - Return to my office as previously scheduled. Procedure Code(s):     --- Professional ---                        [phone], Colonoscopy, flexible; with removal of                         tumor(s), polyp(s), or other lesion(s) by snare                         technique                        45380, 59, Colonoscopy, flexible; with biopsy, single                         or multiple Diagnosis Code(s):     --- Professional ---                        K62.1, Rectal polyp                        R10.31, Right lower quadrant pain                        R19.7, Diarrhea, unspecified                        K62.5, Hemorrhage of anus and rectum                        R63.4, Abnormal weight loss CPT  copyright 2019 American Medical Association. All rights reserved. The codes documented in this report are preliminary and upon coder review may  be revised to meet current compliance requirements. Dr. Libby Maw Toney Reil MD, MD 01/24/2021 9:08:47 AM This report has been signed electronically. Number of Addenda: 0 Note Initiated On: 01/24/2021 7:17 AM Scope Withdrawal Time: 0 hours 13 minutes 37 seconds  Total Procedure Duration: 0 hours 16 minutes 25 seconds  Estimated Blood Loss:  Estimated blood loss: none.      [facility]

## 2021-01-24 NOTE — Anesthesia Preprocedure Evaluation (Addendum)
Anesthesia Evaluation  Patient identified by MRN, date of birth, ID band Patient awake    Reviewed: Allergy & Precautions, NPO status , Patient's Chart, lab work & pertinent test results  Airway Mallampati: II  TM Distance: >3 FB Neck ROM: Full    Dental no notable dental hx.    Pulmonary Current Smoker,    Pulmonary exam normal        Cardiovascular negative cardio ROS Normal cardiovascular exam     Neuro/Psych PSYCHIATRIC DISORDERS Anxiety Depression negative neurological ROS     GI/Hepatic Neg liver ROS, Abdominal pain    Endo/Other  diabetes  Renal/GU negative Renal ROS   PID    Musculoskeletal negative musculoskeletal ROS (+)   Abdominal Normal abdominal exam  (+) - obese,   Peds  Hematology negative hematology ROS (+)   Anesthesia Other Findings   Reproductive/Obstetrics negative OB ROS                           Anesthesia Physical Anesthesia Plan  ASA: 2  Anesthesia Plan: General   Post-op Pain Management:    Induction: Intravenous  PONV Risk Score and Plan: 2 and TIVA, Treatment may vary due to age or medical condition and Propofol infusion  Airway Management Planned: Natural Airway and Nasal Cannula  Additional Equipment:   Intra-op Plan:   Post-operative Plan:   Informed Consent: I have reviewed the patients History and Physical, chart, labs and discussed the procedure including the risks, benefits and alternatives for the proposed anesthesia with the patient or authorized representative who has indicated his/her understanding and acceptance.     Dental advisory given  Plan Discussed with: CRNA  Anesthesia Plan Comments:        Anesthesia Quick Evaluation

## 2021-01-25 ENCOUNTER — Encounter: Payer: Self-pay | Admitting: Gastroenterology

## 2021-01-25 LAB — SURGICAL PATHOLOGY

## 2021-04-15 ENCOUNTER — Other Ambulatory Visit: Payer: Self-pay

## 2021-04-15 ENCOUNTER — Ambulatory Visit (INDEPENDENT_AMBULATORY_CARE_PROVIDER_SITE_OTHER): Payer: Medicare Other | Admitting: Internal Medicine

## 2021-04-15 ENCOUNTER — Encounter: Payer: Self-pay | Admitting: Internal Medicine

## 2021-04-15 VITALS — BP 98/65 | HR 90 | Temp 97.9°F | Ht 60.0 in | Wt 112.8 lb

## 2021-04-15 DIAGNOSIS — Z20822 Contact with and (suspected) exposure to covid-19: Secondary | ICD-10-CM | POA: Diagnosis not present

## 2021-04-15 DIAGNOSIS — J069 Acute upper respiratory infection, unspecified: Secondary | ICD-10-CM | POA: Diagnosis not present

## 2021-04-15 DIAGNOSIS — F419 Anxiety disorder, unspecified: Secondary | ICD-10-CM | POA: Diagnosis not present

## 2021-04-15 DIAGNOSIS — F17200 Nicotine dependence, unspecified, uncomplicated: Secondary | ICD-10-CM

## 2021-04-15 LAB — POC COVID19 BINAXNOW: SARS Coronavirus 2 Ag: NEGATIVE

## 2021-04-15 MED ORDER — BENZONATATE 100 MG PO CAPS
100.0000 mg | ORAL_CAPSULE | Freq: Three times a day (TID) | ORAL | 0 refills | Status: DC
Start: 2021-04-15 — End: 2021-09-16

## 2021-04-15 NOTE — Progress Notes (Signed)
Established Patient Office Visit  Subjective:  Patient ID: Frances Sanders, female    DOB: 06/08/1981  Age: 40 y.o. MRN: [id number]  CC: No chief complaint on file.   URI  This is a new problem. The current episode started yesterday. The maximum temperature recorded prior to her arrival was 100.4 - 100.9 F. Associated symptoms include congestion, coughing, headaches, joint pain and wheezing. Pertinent negatives include no abdominal pain, chest pain, diarrhea or rash.   Leafy Kindle Therrell presents for uri  Past Medical History:  Diagnosis Date   Anxiety    Cough 06/07/2020   Depression    Diabetes mellitus without complication (HCC)    Gestational diabetes    Insomnia    Paranoid schizophrenia (HCC)    Sinus drainage 11/01/2020    Past Surgical History:  Procedure Laterality Date   APPENDECTOMY     BIOPSY N/A 01/24/2021   Procedure: BIOPSY;  Surgeon: Toney Reil, MD;  Location: [facility];  Service: Endoscopy;  Laterality: N/A;   COLONOSCOPY WITH PROPOFOL N/A 01/24/2021   Procedure: COLONOSCOPY WITH PROPOFOL;  Surgeon: Toney Reil, MD;  Location: [facility];  Service: Endoscopy;  Laterality: N/A;   HERNIA REPAIR     POLYPECTOMY N/A 01/24/2021   Procedure: POLYPECTOMY;  Surgeon: Toney Reil, MD;  Location: [facility];  Service: Endoscopy;  Laterality: N/A;   TUBAL LIGATION Bilateral 07/03/2016   Procedure: POST PARTUM TUBAL LIGATION BY SALPINGECTOMY;  Surgeon: Nadara Mustard, MD;  Location: [facility];  Service: Gynecology;  Laterality: Bilateral;    Family History  Problem Relation Age of Onset   Diabetes Paternal Aunt    Diabetes Paternal Uncle    Diabetes Maternal Grandmother    Heart disease Maternal Grandmother    Hypertension Maternal Grandmother    Colon cancer Maternal Grandmother    Diabetes Maternal Grandfather    Ovarian cancer Cousin 25       ? accurate family history   Uterine cancer Cousin      Social History   Socioeconomic History   Marital status: Married    Spouse name: Not on file   Number of children: Not on file   Years of education: Not on file   Highest education level: Not on file  Occupational History   Not on file  Tobacco Use   Smoking status: Every Day    Packs/day: 0.50    Types: Cigarettes   Smokeless tobacco: Never  Vaping Use   Vaping Use: Never used  Substance and Sexual Activity   Alcohol use: No   Drug use: No   Sexual activity: Yes    Birth control/protection: Injection  Other Topics Concern   Not on file  Social History Narrative   Not on file   Social Determinants of Health   Financial Resource Strain: Not on file  Food Insecurity: Not on file  Transportation Needs: Not on file  Physical Activity: Not on file  Stress: Not on file  Social Connections: Not on file  Intimate Partner Violence: Not on file     Current Outpatient Medications:    benzonatate (TESSALON) 100 MG capsule, Take 1 capsule (100 mg total) by mouth 3 (three) times daily., Disp: 30 capsule, Rfl: 0   ARIPiprazole (ABILIFY) 5 MG tablet, Take 5 mg by mouth daily. (Patient not taking: Reported on 01/24/2021), Disp: , Rfl:    diazepam (VALIUM) 5 MG tablet, Take 5 mg by mouth every 6 (six) hours as  needed for anxiety., Disp: , Rfl:    metFORMIN (GLUCOPHAGE) 500 MG tablet, 500 mg 2 (two) times daily., Disp: , Rfl:    mirtazapine (REMERON) 15 MG tablet, , Disp: , Rfl: 2   omeprazole (PRILOSEC) 20 MG capsule, TAKE 1 CAPSULE BY MOUTH DAILY, Disp: 30 capsule, Rfl: 11   ondansetron (ZOFRAN ODT) 4 MG disintegrating tablet, Take 1 tablet (4 mg total) by mouth every 8 (eight) hours as needed for nausea or vomiting., Disp: 20 tablet, Rfl: 0   promethazine (PHENERGAN) 12.5 MG tablet, Take 12.5 mg by mouth every 6 (six) hours as needed for nausea or vomiting., Disp: , Rfl:    No Known Allergies  ROS Review of Systems  Constitutional:  Positive for chills, fatigue and fever.  Negative for diaphoresis.  HENT:  Positive for congestion.   Eyes: Negative.   Respiratory:  Positive for cough, shortness of breath and wheezing.   Cardiovascular: Negative.  Negative for chest pain.  Gastrointestinal: Negative.  Negative for abdominal pain and diarrhea.  Endocrine: Negative.   Genitourinary: Negative.   Musculoskeletal:  Positive for joint pain.  Skin: Negative.  Negative for rash.  Allergic/Immunologic: Negative.   Neurological:  Positive for headaches.  Hematological: Negative.   Psychiatric/Behavioral: Negative.    All other systems reviewed and are negative.    Objective:    Physical Exam Vitals reviewed.  Constitutional:      Appearance: Normal appearance.  HENT:     Mouth/Throat:     Mouth: Mucous membranes are moist.  Eyes:     Pupils: Pupils are equal, round, and reactive to light.  Neck:     Vascular: No carotid bruit.  Cardiovascular:     Rate and Rhythm: Normal rate and regular rhythm.     Pulses: Normal pulses.     Heart sounds: Normal heart sounds.  Pulmonary:     Effort: Pulmonary effort is normal.     Breath sounds: Examination of the right-upper field reveals wheezing. Examination of the left-upper field reveals wheezing. Wheezing present.  Abdominal:     General: Bowel sounds are normal.     Palpations: Abdomen is soft. There is no hepatomegaly, splenomegaly or mass.     Tenderness: There is no abdominal tenderness.     Hernia: No hernia is present.  Musculoskeletal:        General: No tenderness.     Cervical back: Neck supple.     Right lower leg: No edema.     Left lower leg: No edema.  Skin:    Findings: No rash.  Neurological:     Mental Status: She is alert and oriented to person, place, and time.     Motor: No weakness.  Psychiatric:        Mood and Affect: Mood and affect normal.        Behavior: Behavior normal.    BP 98/65   Pulse 90   Temp 97.9 F (36.6 C)   Ht 5' (1.524 m)   Wt 112 lb 12.8 oz (51.2 kg)    BMI 22.03 kg/m  Wt Readings from Last 3 Encounters:  04/15/21 112 lb 12.8 oz (51.2 kg)  01/24/21 115 lb (52.2 kg)  12/19/20 119 lb 2 oz (54 kg)     Health Maintenance Due  Topic Date Due   COVID-19 Vaccine (1) Never done   Pneumococcal Vaccine 44-73 Years old (1 - PCV) Never done   HIV Screening  Never done   Hepatitis C Screening  Never done   TETANUS/TDAP  Never done   INFLUENZA VACCINE  Never done    There are no preventive care reminders to display for this patient.  Lab Results  Component Value Date   TSH 2.030 09/13/2019   Lab Results  Component Value Date   WBC 7.7 08/30/2020   HGB 15.5 08/30/2020   HCT 43.1 08/30/2020   MCV 94.7 08/30/2020   PLT 369 08/30/2020   Lab Results  Component Value Date   NA 138 08/30/2020   K 4.4 08/30/2020   CO2 19 (L) 08/30/2020   GLUCOSE 86 08/30/2020   BUN 6 (L) 08/30/2020   CREATININE 0.63 08/30/2020   BILITOT 0.5 08/30/2020   ALKPHOS 76 01/31/2020   AST 28 08/30/2020   ALT 27 08/30/2020   PROT 6.8 08/30/2020   ALBUMIN 4.5 01/31/2020   CALCIUM 9.3 08/30/2020   ANIONGAP 9 01/31/2020   Lab Results  Component Value Date   CHOL 166 09/13/2019   Lab Results  Component Value Date   HDL 47 09/13/2019   Lab Results  Component Value Date   LDLCALC 91 09/13/2019   Lab Results  Component Value Date   TRIG 159 (H) 09/13/2019   Lab Results  Component Value Date   CHOLHDL 3.5 09/13/2019   Lab Results  Component Value Date   HGBA1C 5.5 09/07/2015      Assessment & Plan:   Problem List Items Addressed This Visit       Respiratory   Viral upper respiratory tract infection    Throat is not congested patient has wheezing, he denies chest pain she has a fever last night sinuses are not tender        Other   Tobacco use disorder    - I instructed the patient to stop smoking and provided them with smoking cessation materials.  - I informed the patient that smoking puts them at increased risk for cancer, COPD,  hypertension, and more.  - Informed the patient to seek help if they begin to have trouble breathing, develop chest pain, start to cough up blood, feel faint, or pass out.      Anxiety    - Patient experiencing high levels of anxiety.  - Encouraged patient to engage in relaxing activities like yoga, meditation, journaling, going for a walk, or participating in a hobby.  - Encouraged patient to reach out to trusted friends or family members about recent struggles, Patient was advised to read A book, how to stop worrying and start living, it is good book to read to control  the stress       Suspected COVID-19 virus infection - Primary    COVID test is negative      Relevant Orders   POC COVID-19 (Completed)    Meds ordered this encounter  Medications   benzonatate (TESSALON) 100 MG capsule    Sig: Take 1 capsule (100 mg total) by mouth 3 (three) times daily.    Dispense:  30 capsule    Refill:  0    Follow-up: No follow-ups on file.  Patient was advised not to go to work for 2 to 3 days.  Continue supportive treatment COVID is negative Corky Downs, MD

## 2021-04-15 NOTE — Assessment & Plan Note (Signed)
-   Patient experiencing high levels of anxiety.  - Encouraged patient to engage in relaxing activities like yoga, meditation, journaling, going for a walk, or participating in a hobby.  - Encouraged patient to reach out to trusted friends or family members about recent struggles, Patient was advised to read A book, how to stop worrying and start living, it is good book to read to control  the stress  

## 2021-04-15 NOTE — Assessment & Plan Note (Signed)
-   I instructed the patient to stop smoking and provided them with smoking cessation materials.  - I informed the patient that smoking puts them at increased risk for cancer, COPD, hypertension, and more.  - Informed the patient to seek help if they begin to have trouble breathing, develop chest pain, start to cough up blood, feel faint, or pass out.  

## 2021-04-15 NOTE — Assessment & Plan Note (Signed)
Throat is not congested patient has wheezing, he denies chest pain she has a fever last night sinuses are not tender

## 2021-04-15 NOTE — Assessment & Plan Note (Signed)
COVID test is negative

## 2021-08-01 ENCOUNTER — Other Ambulatory Visit: Payer: Self-pay

## 2021-08-09 ENCOUNTER — Ambulatory Visit: Payer: Medicare Other

## 2021-08-23 DIAGNOSIS — M5412 Radiculopathy, cervical region: Secondary | ICD-10-CM | POA: Diagnosis not present

## 2021-08-23 DIAGNOSIS — M9901 Segmental and somatic dysfunction of cervical region: Secondary | ICD-10-CM | POA: Diagnosis not present

## 2021-08-23 DIAGNOSIS — M9902 Segmental and somatic dysfunction of thoracic region: Secondary | ICD-10-CM | POA: Diagnosis not present

## 2021-08-23 DIAGNOSIS — M546 Pain in thoracic spine: Secondary | ICD-10-CM | POA: Diagnosis not present

## 2021-09-16 ENCOUNTER — Other Ambulatory Visit: Payer: Self-pay

## 2021-09-16 ENCOUNTER — Ambulatory Visit (INDEPENDENT_AMBULATORY_CARE_PROVIDER_SITE_OTHER): Payer: Medicare Other | Admitting: Obstetrics & Gynecology

## 2021-09-16 ENCOUNTER — Encounter: Payer: Self-pay | Admitting: Obstetrics & Gynecology

## 2021-09-16 VITALS — BP 120/80 | Ht 60.0 in | Wt 114.0 lb

## 2021-09-16 DIAGNOSIS — Z1329 Encounter for screening for other suspected endocrine disorder: Secondary | ICD-10-CM | POA: Diagnosis not present

## 2021-09-16 DIAGNOSIS — Z130 Encounter for screening for diseases of the blood and blood-forming organs and certain disorders involving the immune mechanism: Secondary | ICD-10-CM

## 2021-09-16 DIAGNOSIS — Z131 Encounter for screening for diabetes mellitus: Secondary | ICD-10-CM

## 2021-09-16 DIAGNOSIS — Z1321 Encounter for screening for nutritional disorder: Secondary | ICD-10-CM | POA: Diagnosis not present

## 2021-09-16 MED ORDER — BENZONATATE 100 MG PO CAPS: 100.0000 mg | ORAL_CAPSULE | Freq: Three times a day (TID) | ORAL | 0 refills | Status: DC

## 2021-09-16 NOTE — Progress Notes (Signed)
HPI:      [name] is a 41 y.o. [phone] who LMP was No LMP recorded. (Menstrual status: IUD)., she presents today for her annual examination. The patient has no complaints today. The patient is sexually active. Her last pap: approximate date 2021 and was normal. The patient does perform self breast exams.  There is no notable family history of breast or ovarian cancer in her family.  The patient has regular exercise: yes.  The patient denies current symptoms of depression.    GYN History: Contraception: IUD  PMHx: Past Medical History:  Diagnosis Date   Anxiety    Cough 06/07/2020   Depression    Diabetes mellitus without complication ([place])    Gestational diabetes    Insomnia    Paranoid schizophrenia ([place])    Sinus drainage 11/01/2020   Past Surgical History:  Procedure Laterality Date   APPENDECTOMY     BIOPSY N/A 01/24/2021   Procedure: BIOPSY;  Surgeon: Lin Landsman, MD;  Location: [place];  Service: Endoscopy;  Laterality: N/A;   COLONOSCOPY WITH PROPOFOL N/A 01/24/2021   Procedure: COLONOSCOPY WITH PROPOFOL;  Surgeon: Lin Landsman, MD;  Location: [place];  Service: Endoscopy;  Laterality: N/A;   HERNIA REPAIR     POLYPECTOMY N/A 01/24/2021   Procedure: POLYPECTOMY;  Surgeon: Lin Landsman, MD;  Location: [place];  Service: Endoscopy;  Laterality: N/A;   TUBAL LIGATION Bilateral 07/03/2016   Procedure: POST PARTUM TUBAL LIGATION BY SALPINGECTOMY;  Surgeon: Gae Dry, MD;  Location: [facility];  Service: Gynecology;  Laterality: Bilateral;   Family History  Problem Relation Age of Onset   Diabetes Paternal Aunt    Diabetes Paternal Uncle    Diabetes Maternal Grandmother    Heart disease Maternal Grandmother    Hypertension Maternal Grandmother    Colon cancer Maternal Grandmother    Diabetes Maternal Grandfather    Ovarian cancer Cousin 25       ? accurate family history   Uterine cancer Cousin     Social History   Tobacco Use   Smoking status: Every Day    Packs/day: 0.50    Types: Cigarettes   Smokeless tobacco: Never  Vaping Use   Vaping Use: Never used  Substance Use Topics   Alcohol use: No   Drug use: No    Current Outpatient Medications:    ARIPiprazole (ABILIFY) 5 MG tablet, Take 5 mg by mouth daily., Disp: , Rfl:    benzonatate (TESSALON) 100 MG capsule, Take 1 capsule (100 mg total) by mouth 3 (three) times daily., Disp: 30 capsule, Rfl: 0   diazepam (VALIUM) 5 MG tablet, Take 5 mg by mouth every 6 (six) hours as needed for anxiety., Disp: , Rfl:    metFORMIN (GLUCOPHAGE) 500 MG tablet, 500 mg 2 (two) times daily., Disp: , Rfl:    mirtazapine (REMERON) 15 MG tablet, , Disp: , Rfl: 2   omeprazole (PRILOSEC) 20 MG capsule, TAKE 1 CAPSULE BY MOUTH DAILY, Disp: 30 capsule, Rfl: 11   ondansetron (ZOFRAN ODT) 4 MG disintegrating tablet, Take 1 tablet (4 mg total) by mouth every 8 (eight) hours as needed for nausea or vomiting. (Patient not taking: Reported on 09/16/2021), Disp: 20 tablet, Rfl: 0   promethazine (PHENERGAN) 12.5 MG tablet, Take 12.5 mg by mouth every 6 (six) hours as needed for nausea or vomiting. (Patient not taking: Reported on 09/16/2021), Disp: , Rfl:  Allergies: Patient has no known  allergies.  Review of Systems  Constitutional:  Negative for chills, fever and malaise/fatigue.  HENT:  Negative for congestion, sinus pain and sore throat.   Eyes:  Negative for blurred vision and pain.  Respiratory:  Negative for cough and wheezing.   Cardiovascular:  Negative for chest pain and leg swelling.  Gastrointestinal:  Negative for abdominal pain, constipation, diarrhea, heartburn, nausea and vomiting.  Genitourinary:  Negative for dysuria, frequency, hematuria and urgency.  Musculoskeletal:  Negative for back pain, joint pain, myalgias and neck pain.  Skin:  Negative for itching and rash.  Neurological:  Negative for dizziness, tremors and weakness.   Endo/Heme/Allergies:  Does not bruise/bleed easily.  Psychiatric/Behavioral:  Negative for depression. The patient is not nervous/anxious and does not have insomnia.    Objective: BP 120/80    Ht 5' (1.524 m)    Wt 114 lb (51.7 kg)    BMI 22.26 kg/m   Filed Weights   09/16/21 1409  Weight: 114 lb (51.7 kg)   Body mass index is 22.26 kg/m. Physical Exam Constitutional:      General: She is not in acute distress.    Appearance: She is well-developed.  Genitourinary:     Bladder, rectum and urethral meatus normal.     No lesions in the vagina.     Right Labia: No rash, tenderness or lesions.    Left Labia: No tenderness, lesions or rash.    No vaginal bleeding.      Right Adnexa: not tender and no mass present.    Left Adnexa: tender and mass present.    Adnexa exam comments: Mild left adnexal T 2 cm cyst left adnexa.     No cervical motion tenderness, friability, lesion or polyp.     Uterus is not enlarged.     No uterine mass detected.    Pelvic exam was performed with patient in the lithotomy position.  Breasts:    Right: No mass, skin change or tenderness.     Left: No mass, skin change or tenderness.  HENT:     Head: Normocephalic and atraumatic. No laceration.     Right Ear: Hearing normal.     Left Ear: Hearing normal.     Mouth/Throat:     Pharynx: Uvula midline.  Eyes:     Pupils: Pupils are equal, round, and reactive to light.  Neck:     Thyroid: No thyromegaly.  Cardiovascular:     Rate and Rhythm: Normal rate and regular rhythm.     Heart sounds: No murmur heard.   No friction rub. No gallop.  Pulmonary:     Effort: Pulmonary effort is normal. No respiratory distress.     Breath sounds: Normal breath sounds. No wheezing.  Abdominal:     General: Bowel sounds are normal. There is no distension.     Palpations: Abdomen is soft.     Tenderness: There is no abdominal tenderness. There is no rebound.  Musculoskeletal:        General: Normal range of  motion.     Cervical back: Normal range of motion and neck supple.  Neurological:     Mental Status: She is alert and oriented to person, place, and time.     Cranial Nerves: No cranial nerve deficit.  Skin:    General: Skin is warm and dry.  Psychiatric:        Judgment: Judgment normal.  Vitals reviewed.    Assessment:  ANNUAL EXAM 1. Encounter for vitamin  deficiency screening   2. Screening for thyroid disorder   3. Screening for diabetes mellitus   4. Screening for deficiency anemia      Screening Plan:            1.  Cervical Screening-  Pap smear schedule reviewed with patient, Pap smear to be scheduled, 2024  2. Breast screening- Exam annually and mammogram>40 planned   3. Left ovarian cyst, tender only w exam, no other sx's Monitor and see if she develops sx's, then would do [place]  4. Labs Ordered today  5. Counseling for contraception: IUD  [facility] - 09/16/21 1412       Pregnancy Intention Screening   Does the patient want to become pregnant in the next year? No    Does the patient's partner want to become pregnant in the next year? No    Would the patient like to discuss contraceptive options today? No      Contraception Wrap Up   Current Method IUD or IUS    End Method IUD or IUS    Contraception Counseling Provided No               F/U  Return for and Annual when due.  Barnett Applebaum, MD, [facility], [facility] 09/16/2021  2:48 PM

## 2021-09-16 NOTE — Patient Instructions (Signed)
PAP every three years Labs yearly  Thank you for choosing [facility]. As part of our ongoing efforts to improve patient experience, we would appreciate your feedback. Please fill out the short survey that you will receive by mail or MyChart. Your opinion is important to [place]! - Dr. Tiburcio Pea

## 2021-09-17 ENCOUNTER — Encounter: Payer: Self-pay | Admitting: Obstetrics & Gynecology

## 2021-09-17 LAB — CBC WITH DIFFERENTIAL
Basophils Absolute: 0 10*3/uL (ref 0.0–0.2)
Basos: 0 %
EOS (ABSOLUTE): 0.2 10*3/uL (ref 0.0–0.4)
Eos: 2 %
Hematocrit: 41 % (ref 34.0–46.6)
Hemoglobin: 14.4 g/dL (ref 11.1–15.9)
Immature Grans (Abs): 0 10*3/uL (ref 0.0–0.1)
Immature Granulocytes: 0 %
Lymphocytes Absolute: 3.1 10*3/uL (ref 0.7–3.1)
Lymphs: 32 %
MCH: 34.2 pg — ABNORMAL HIGH (ref 26.6–33.0)
MCHC: 35.1 g/dL (ref 31.5–35.7)
MCV: 97 fL (ref 79–97)
Monocytes Absolute: 0.5 10*3/uL (ref 0.1–0.9)
Monocytes: 5 %
Neutrophils Absolute: 6 10*3/uL (ref 1.4–7.0)
Neutrophils: 61 %
RBC: 4.21 x10E6/uL (ref 3.77–5.28)
RDW: 12.2 % (ref 11.7–15.4)
WBC: 9.9 10*3/uL (ref 3.4–10.8)

## 2021-09-17 LAB — CMP AND LIVER
ALT: 7 IU/L (ref 0–32)
AST: 14 IU/L (ref 0–40)
Albumin: 4.9 g/dL — ABNORMAL HIGH (ref 3.8–4.8)
Alkaline Phosphatase: 98 IU/L (ref 44–121)
BUN: 6 mg/dL (ref 6–20)
Bilirubin Total: 0.4 mg/dL (ref 0.0–1.2)
Bilirubin, Direct: 0.13 mg/dL (ref 0.00–0.40)
CO2: 24 mmol/L (ref 20–29)
Calcium: 9.1 mg/dL (ref 8.7–10.2)
Chloride: 102 mmol/L (ref 96–106)
Creatinine, Ser: 0.71 mg/dL (ref 0.57–1.00)
Glucose: 69 mg/dL — ABNORMAL LOW (ref 70–99)
Potassium: 3.9 mmol/L (ref 3.5–5.2)
Sodium: 142 mmol/L (ref 134–144)
Total Protein: 7 g/dL (ref 6.0–8.5)
eGFR: 112 mL/min/{1.73_m2} (ref >59)

## 2021-09-17 LAB — VITAMIN D 25 HYDROXY (VIT D DEFICIENCY, FRACTURES): Vit D, 25-Hydroxy: 44.2 ng/mL (ref 30.0–100.0)

## 2021-09-17 LAB — VITAMIN B12: Vitamin B-12: 256 pg/mL (ref 232–1245)

## 2021-09-17 LAB — TSH: TSH: 1.34 u[IU]/mL (ref 0.450–4.500)

## 2021-09-18 ENCOUNTER — Telehealth: Payer: Self-pay

## 2021-09-18 ENCOUNTER — Encounter: Payer: Self-pay | Admitting: Obstetrics & Gynecology

## 2021-09-18 ENCOUNTER — Ambulatory Visit (INDEPENDENT_AMBULATORY_CARE_PROVIDER_SITE_OTHER): Payer: Medicare Other

## 2021-09-18 ENCOUNTER — Other Ambulatory Visit: Payer: Self-pay

## 2021-09-18 ENCOUNTER — Other Ambulatory Visit: Payer: Self-pay | Admitting: Obstetrics and Gynecology

## 2021-09-18 DIAGNOSIS — R300 Dysuria: Secondary | ICD-10-CM | POA: Diagnosis not present

## 2021-09-18 LAB — POCT URINALYSIS DIPSTICK
Bilirubin, UA: NEGATIVE
Glucose, UA: NEGATIVE
Ketones, UA: NEGATIVE
Nitrite, UA: POSITIVE
Protein, UA: NEGATIVE
Spec Grav, UA: 1.01 (ref 1.010–1.025)
Urobilinogen, UA: 0.2 E.U./dL
pH, UA: 5 (ref 5.0–8.0)

## 2021-09-18 MED ORDER — CIPROFLOXACIN HCL 500 MG PO TABS: 500.0000 mg | ORAL_TABLET | Freq: Two times a day (BID) | ORAL | 0 refills | Status: AC

## 2021-09-18 NOTE — Telephone Encounter (Signed)
Pt dropped off urine sample and positive for Nitrates and Blood. Pt c/o frequency and dysuria, can you send in a Rx for a uti, Pt states she usually take cipro for uti. Please advise since [facility] not in the office.

## 2021-09-18 NOTE — Telephone Encounter (Signed)
Rx cipro eRxd

## 2021-09-18 NOTE — Telephone Encounter (Signed)
Pt aware.

## 2021-09-21 ENCOUNTER — Encounter: Payer: Self-pay | Admitting: Obstetrics & Gynecology

## 2021-09-21 ENCOUNTER — Other Ambulatory Visit: Payer: Self-pay | Admitting: Obstetrics & Gynecology

## 2021-09-21 LAB — URINE CULTURE

## 2021-09-21 MED ORDER — SULFAMETHOXAZOLE-TRIMETHOPRIM 800-160 MG PO TABS: 1.0000 | ORAL_TABLET | Freq: Two times a day (BID) | ORAL | 1 refills | Status: DC

## 2021-10-01 ENCOUNTER — Encounter: Payer: Self-pay | Admitting: Obstetrics & Gynecology

## 2021-10-14 ENCOUNTER — Ambulatory Visit: Payer: Medicare Other | Admitting: Obstetrics & Gynecology

## 2021-10-30 ENCOUNTER — Emergency Department (EMERGENCY_DEPARTMENT_HOSPITAL)
Admission: EM | Admit: 2021-10-30 | Discharge: 2021-10-31 | Disposition: A | Discharge status: Other Acute Inpatient Hospital | Payer: Medicare Other | Source: Home / Self Care | Attending: Emergency Medicine | Admitting: Emergency Medicine

## 2021-10-30 ENCOUNTER — Encounter: Payer: Self-pay | Admitting: *Deleted

## 2021-10-30 ENCOUNTER — Other Ambulatory Visit: Payer: Self-pay

## 2021-10-30 DIAGNOSIS — Z20822 Contact with and (suspected) exposure to covid-19: Secondary | ICD-10-CM | POA: Insufficient documentation

## 2021-10-30 DIAGNOSIS — F309 Manic episode, unspecified: Secondary | ICD-10-CM | POA: Diagnosis not present

## 2021-10-30 DIAGNOSIS — F17210 Nicotine dependence, cigarettes, uncomplicated: Secondary | ICD-10-CM | POA: Insufficient documentation

## 2021-10-30 DIAGNOSIS — M5412 Radiculopathy, cervical region: Secondary | ICD-10-CM | POA: Diagnosis not present

## 2021-10-30 DIAGNOSIS — Z7984 Long term (current) use of oral hypoglycemic drugs: Secondary | ICD-10-CM | POA: Insufficient documentation

## 2021-10-30 DIAGNOSIS — F332 Major depressive disorder, recurrent severe without psychotic features: Secondary | ICD-10-CM | POA: Diagnosis present

## 2021-10-30 DIAGNOSIS — F419 Anxiety disorder, unspecified: Secondary | ICD-10-CM | POA: Diagnosis present

## 2021-10-30 DIAGNOSIS — T424X1A Poisoning by benzodiazepines, accidental (unintentional), initial encounter: Secondary | ICD-10-CM | POA: Diagnosis present

## 2021-10-30 DIAGNOSIS — M9901 Segmental and somatic dysfunction of cervical region: Secondary | ICD-10-CM | POA: Diagnosis not present

## 2021-10-30 DIAGNOSIS — M546 Pain in thoracic spine: Secondary | ICD-10-CM | POA: Diagnosis not present

## 2021-10-30 DIAGNOSIS — F312 Bipolar disorder, current episode manic severe with psychotic features: Secondary | ICD-10-CM | POA: Diagnosis not present

## 2021-10-30 DIAGNOSIS — E119 Type 2 diabetes mellitus without complications: Secondary | ICD-10-CM | POA: Insufficient documentation

## 2021-10-30 DIAGNOSIS — F17200 Nicotine dependence, unspecified, uncomplicated: Secondary | ICD-10-CM | POA: Diagnosis present

## 2021-10-30 DIAGNOSIS — M9902 Segmental and somatic dysfunction of thoracic region: Secondary | ICD-10-CM | POA: Diagnosis not present

## 2021-10-30 DIAGNOSIS — R4589 Other symptoms and signs involving emotional state: Secondary | ICD-10-CM | POA: Diagnosis present

## 2021-10-30 LAB — URINALYSIS, ROUTINE W REFLEX MICROSCOPIC
Bilirubin Urine: NEGATIVE
Glucose, UA: 150 mg/dL — AB
Hgb urine dipstick: NEGATIVE
Ketones, ur: NEGATIVE mg/dL
Leukocytes,Ua: NEGATIVE
Nitrite: NEGATIVE
Protein, ur: NEGATIVE mg/dL
Specific Gravity, Urine: 1.002 — ABNORMAL LOW (ref 1.005–1.030)
pH: 6 (ref 5.0–8.0)

## 2021-10-30 LAB — COMPREHENSIVE METABOLIC PANEL
ALT: 11 U/L (ref 0–44)
AST: 26 U/L (ref 15–41)
Albumin: 4.9 g/dL (ref 3.5–5.0)
Alkaline Phosphatase: 67 U/L (ref 38–126)
Anion gap: 13 (ref 5–15)
BUN: 5 mg/dL — ABNORMAL LOW (ref 6–20)
CO2: 20 mmol/L — ABNORMAL LOW (ref 22–32)
Calcium: 9.3 mg/dL (ref 8.9–10.3)
Chloride: 99 mmol/L (ref 98–111)
Creatinine, Ser: 0.78 mg/dL (ref 0.44–1.00)
GFR, Estimated: >60 mL/min (ref >60)
Glucose, Bld: 172 mg/dL — ABNORMAL HIGH (ref 70–99)
Potassium: 3.5 mmol/L (ref 3.5–5.1)
Sodium: 132 mmol/L — ABNORMAL LOW (ref 135–145)
Total Bilirubin: 0.6 mg/dL (ref 0.3–1.2)
Total Protein: 8.2 g/dL — ABNORMAL HIGH (ref 6.5–8.1)

## 2021-10-30 LAB — URINE DRUG SCREEN, QUALITATIVE (ARMC ONLY)
Amphetamines, Ur Screen: NOT DETECTED
Barbiturates, Ur Screen: NOT DETECTED
Benzodiazepine, Ur Scrn: POSITIVE — AB
Cannabinoid 50 Ng, Ur ~~LOC~~: NOT DETECTED
Cocaine Metabolite,Ur ~~LOC~~: NOT DETECTED
MDMA (Ecstasy)Ur Screen: NOT DETECTED
Methadone Scn, Ur: NOT DETECTED
Opiate, Ur Screen: NOT DETECTED
Phencyclidine (PCP) Ur S: NOT DETECTED
Tricyclic, Ur Screen: NOT DETECTED

## 2021-10-30 LAB — ACETAMINOPHEN LEVEL: Acetaminophen (Tylenol), Serum: <10 ug/mL — ABNORMAL LOW (ref 10–30)

## 2021-10-30 LAB — CBC
HCT: 45.2 % (ref 36.0–46.0)
Hemoglobin: 15.2 g/dL — ABNORMAL HIGH (ref 12.0–15.0)
MCH: 33.6 pg (ref 26.0–34.0)
MCHC: 33.6 g/dL (ref 30.0–36.0)
MCV: 99.8 fL (ref 80.0–100.0)
Platelets: 331 10*3/uL (ref 150–400)
RBC: 4.53 MIL/uL (ref 3.87–5.11)
RDW: 12.8 % (ref 11.5–15.5)
WBC: 10.9 10*3/uL — ABNORMAL HIGH (ref 4.0–10.5)
nRBC: 0 % (ref 0.0–0.2)

## 2021-10-30 LAB — PREGNANCY, URINE: Preg Test, Ur: NEGATIVE

## 2021-10-30 LAB — ETHANOL: Alcohol, Ethyl (B): <10 mg/dL (ref <10)

## 2021-10-30 LAB — SALICYLATE LEVEL: Salicylate Lvl: <7 mg/dL — ABNORMAL LOW (ref 7.0–30.0)

## 2021-10-30 LAB — POC URINE PREG, ED: Preg Test, Ur: NEGATIVE

## 2021-10-30 MED ORDER — METFORMIN HCL 500 MG PO TABS
500.0000 mg | ORAL_TABLET | Freq: Two times a day (BID) | ORAL | Status: DC
  Filled 2021-10-30: qty 1

## 2021-10-30 MED ORDER — ARIPIPRAZOLE 5 MG PO TABS
5.0000 mg | ORAL_TABLET | Freq: Every day | ORAL | Status: DC
  Filled 2021-10-30: qty 1

## 2021-10-30 MED ORDER — DIAZEPAM 5 MG PO TABS
5.0000 mg | ORAL_TABLET | Freq: Four times a day (QID) | ORAL | Status: DC | PRN
  Administered 2021-10-30: 5 mg via ORAL
  Filled 2021-10-30: qty 1

## 2021-10-30 MED ORDER — ONDANSETRON 4 MG PO TBDP
4.0000 mg | ORAL_TABLET | Freq: Once | ORAL | Status: AC
  Administered 2021-10-30: 4 mg via ORAL
  Filled 2021-10-30: qty 1

## 2021-10-30 MED ORDER — MIRTAZAPINE 15 MG PO TABS
15.0000 mg | ORAL_TABLET | Freq: Every day | ORAL | Status: DC
Start: 2021-10-30 — End: 2021-10-31
  Administered 2021-10-30: 15 mg via ORAL
  Filled 2021-10-30: qty 1

## 2021-10-30 MED ORDER — PANTOPRAZOLE SODIUM 40 MG PO TBEC
40.0000 mg | DELAYED_RELEASE_TABLET | Freq: Every day | ORAL | Status: DC
  Filled 2021-10-30: qty 1

## 2021-10-30 NOTE — ED Provider Notes (Signed)
? ?[facility] ?Provider Note ? ? ? Event Date/Time  ? First MD Initiated Contact with Patient 10/30/21 2229   ?  (approximate) ? ? ?History  ? ?Psychiatric Evaluation ? ? ?HPI ? ?Frances Sanders is a 41 y.o. female who is brought in by [employer].  Reportedly she has not been eating or sleeping much.  She went to the [employer] and went in the back and tried to help to cooks cooks the meals.  Then she tried to jump out of the car window and understand what was moving.  When I see her she is mumbling to herself as though she is talking to someone else is not there.  She does answer most of my questions without difficulty.  She may be responding to internal stimuli. ? ?  ? ? ?Physical Exam  ? ?Triage Vital Signs: ?ED Triage Vitals  ?Enc Vitals Group  ?   BP 10/30/21 2136 (!) 148/90  ?   Pulse Rate 10/30/21 2136 (!) 104  ?   Resp 10/30/21 2136 16  ?   Temp --   ?   Temp src --   ?   SpO2 10/30/21 2136 99 %  ?   Weight 10/30/21 2151 115 lb (52.2 kg)  ?   Height 10/30/21 2151 5' (1.524 m)  ?   Head Circumference --   ?   Peak Flow --   ?   Pain Score --   ?   Pain Loc --   ?   Pain Edu? --   ?   Excl. in GC? --   ? ? ?Most recent vital signs: ?Vitals:  ? 10/30/21 2136 10/31/21 0008  ?BP: (!) 148/90   ?Pulse: (!) 104   ?Resp: 16   ?Temp:  98 ?F (36.7 ?C)  ?SpO2: 99%   ? ? ? ?General: Awake, no distress.  See HPI ?CV:  Good peripheral perfusion.  Heart regular rate and rhythm slightly tacky no audible murmurs ?Resp:  Normal effort.  Lungs are clear ?Abd:  No distention.  Soft and nontender ?Extremities: No edema ? ? ?ED Results / Procedures / Treatments  ? ?Labs ?(all labs ordered are listed, but only abnormal results are displayed) ?Labs Reviewed  ?COMPREHENSIVE METABOLIC PANEL - Abnormal; Notable for the following components:  ?    Result Value  ? Sodium 132 (*)   ? CO2 20 (*)   ? Glucose, Bld 172 (*)   ? BUN 5 (*)   ? Total Protein 8.2 (*)   ? All other components within normal  limits  ?SALICYLATE LEVEL - Abnormal; Notable for the following components:  ? Salicylate Lvl <7.0 (*)   ? All other components within normal limits  ?ACETAMINOPHEN LEVEL - Abnormal; Notable for the following components:  ? Acetaminophen (Tylenol), Serum <10 (*)   ? All other components within normal limits  ?CBC - Abnormal; Notable for the following components:  ? WBC 10.9 (*)   ? Hemoglobin 15.2 (*)   ? All other components within normal limits  ?URINE DRUG SCREEN, QUALITATIVE ([facility] ONLY) - Abnormal; Notable for the following components:  ? Benzodiazepine, Ur Scrn POSITIVE (*)   ? All other components within normal limits  ?URINALYSIS, ROUTINE W REFLEX MICROSCOPIC - Abnormal; Notable for the following components:  ? Color, Urine STRAW (*)   ? APPearance CLEAR (*)   ? Specific Gravity, Urine 1.002 (*)   ? Glucose, UA 150 (*)   ?  All other components within normal limits  ?RESP PANEL BY RT-PCR (FLU A&B, COVID) ARPGX2  ?ETHANOL  ?PREGNANCY, URINE  ?TSH  ?POC URINE PREG, ED  ? ? ? ?EKG ? ? ? ? ?RADIOLOGY ? ? ? ?PROCEDURES: ? ?Critical Care performed:  ? ?Procedures ? ? ?MEDICATIONS ORDERED IN ED: ?Medications  ?ARIPiprazole (ABILIFY) tablet 5 mg (has no administration in time range)  ?diazepam (VALIUM) tablet 5 mg (5 mg Oral Given 10/30/21 2339)  ?metFORMIN (GLUCOPHAGE) tablet 500 mg (has no administration in time range)  ?mirtazapine (REMERON) tablet 15 mg (15 mg Oral Given 10/30/21 2339)  ?pantoprazole (PROTONIX) EC tablet 40 mg (has no administration in time range)  ?ondansetron (ZOFRAN-ODT) disintegrating tablet 4 mg (4 mg Oral Given 10/30/21 2342)  ? ? ? ?IMPRESSION / MDM / ASSESSMENT AND PLAN / ED COURSE  ?I reviewed the triage vital signs and the nursing notes. ?Patient seems like she is having mania. ?Review of past old records shows that the patient had at least 2 previous episodes of depression and 1 with psychosis.  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 ?I will  check a TSH. ?Otherwise her blood work is okay.  Sodium slightly low but sugars slightly high at 172.  Her white count is 10.9 there is no sign of any ingestions except for benzos.  She did have a UTI with a culture positive in February but urinalysis now is negative.  Psych will see the patient and we will monitor her carefully. ? ? ?FINAL CLINICAL IMPRESSION(S) / ED DIAGNOSES  ? ?Final diagnoses:  ?Mania (HCC)  ? ? ? ?Rx / DC Orders  ? ?ED Discharge Orders   ? ? None  ? ?  ? ? ? ?Note:  This document was prepared using Dragon voice recognition software and may include unintentional dictation errors. ?  ?Arnaldo Natal, MD ?10/31/21 0015 ? ?

## 2021-10-30 NOTE — ED Notes (Signed)
Pt dressed out in hospital scrubs with myself and female police officer.  Pts belongings, which include shirt, tank, bra, jeans, belt, underwear, flip flops, white colored bracelet, rubber bracelets, thumb ring, necklace, stud earrings (all jewelry placed in cup and placed in belonging bag), bagged, labelled, and placed at nurses station by Amy RN.  Pt ambulated to room 21. ?
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
## 2021-10-30 NOTE — ED Notes (Signed)
Pt having multiple episodes of emesis, EDP Malinda notifed, see mar for new orders.  ?

## 2021-10-30 NOTE — ED Triage Notes (Signed)
Pt brought in by [employer].  Pt is voluntary.  Pt not eating or sleeping.    Pt tried to help cook staff at [employer],  pt tried to jump out of a car window.  Pt denies SI or HI.  Pt denies drugs or etoh use.  Pt calm  ?

## 2021-10-31 ENCOUNTER — Encounter: Payer: Self-pay | Admitting: Psychiatry

## 2021-10-31 ENCOUNTER — Inpatient Hospital Stay
Admission: AD | Admit: 2021-10-31 | Discharge: 2021-11-07 | DRG: 885 | Disposition: A | Discharge status: Home / Self Care | Payer: Medicare Other | Source: Intra-hospital | Attending: Psychiatry | Admitting: Psychiatry

## 2021-10-31 DIAGNOSIS — K219 Gastro-esophageal reflux disease without esophagitis: Secondary | ICD-10-CM | POA: Diagnosis present

## 2021-10-31 DIAGNOSIS — F309 Manic episode, unspecified: Secondary | ICD-10-CM

## 2021-10-31 DIAGNOSIS — G4700 Insomnia, unspecified: Secondary | ICD-10-CM | POA: Diagnosis present

## 2021-10-31 DIAGNOSIS — Z20822 Contact with and (suspected) exposure to covid-19: Secondary | ICD-10-CM | POA: Diagnosis present

## 2021-10-31 DIAGNOSIS — Z833 Family history of diabetes mellitus: Secondary | ICD-10-CM | POA: Diagnosis not present

## 2021-10-31 DIAGNOSIS — Z5329 Procedure and treatment not carried out because of patient's decision for other reasons: Secondary | ICD-10-CM | POA: Diagnosis present

## 2021-10-31 DIAGNOSIS — F3110 Bipolar disorder, current episode manic without psychotic features, unspecified: Principal | ICD-10-CM

## 2021-10-31 DIAGNOSIS — Z7984 Long term (current) use of oral hypoglycemic drugs: Secondary | ICD-10-CM | POA: Diagnosis not present

## 2021-10-31 DIAGNOSIS — F17210 Nicotine dependence, cigarettes, uncomplicated: Secondary | ICD-10-CM | POA: Diagnosis present

## 2021-10-31 DIAGNOSIS — F323 Major depressive disorder, single episode, severe with psychotic features: Secondary | ICD-10-CM | POA: Insufficient documentation

## 2021-10-31 DIAGNOSIS — F312 Bipolar disorder, current episode manic severe with psychotic features: Principal | ICD-10-CM | POA: Diagnosis present

## 2021-10-31 DIAGNOSIS — Z9151 Personal history of suicidal behavior: Secondary | ICD-10-CM

## 2021-10-31 DIAGNOSIS — E119 Type 2 diabetes mellitus without complications: Secondary | ICD-10-CM | POA: Diagnosis present

## 2021-10-31 DIAGNOSIS — Z79899 Other long term (current) drug therapy: Secondary | ICD-10-CM | POA: Diagnosis not present

## 2021-10-31 LAB — TSH: TSH: 1.874 u[IU]/mL (ref 0.350–4.500)

## 2021-10-31 LAB — RESP PANEL BY RT-PCR (FLU A&B, COVID) ARPGX2
Influenza A by PCR: NEGATIVE
Influenza B by PCR: NEGATIVE
SARS Coronavirus 2 by RT PCR: NEGATIVE

## 2021-10-31 MED ORDER — NICOTINE POLACRILEX 2 MG MT GUM
2.0000 mg | CHEWING_GUM | OROMUCOSAL | Status: DC | PRN
  Administered 2021-10-31: 2 mg via ORAL
  Filled 2021-10-31: qty 1

## 2021-10-31 MED ORDER — ALUM & MAG HYDROXIDE-SIMETH 200-200-20 MG/5ML PO SUSP
30.0000 mL | ORAL | Status: DC | PRN
  Administered 2021-10-31 – 2021-11-05 (×8): 30 mL via ORAL
  Filled 2021-10-31 (×8): qty 30

## 2021-10-31 MED ORDER — ARIPIPRAZOLE 5 MG PO TABS
5.0000 mg | ORAL_TABLET | Freq: Once | ORAL | Status: DC
  Filled 2021-10-31: qty 1

## 2021-10-31 MED ORDER — DIPHENHYDRAMINE HCL 25 MG PO CAPS
50.0000 mg | ORAL_CAPSULE | Freq: Four times a day (QID) | ORAL | Status: DC | PRN
  Administered 2021-10-31 – 2021-11-02 (×2): 50 mg via ORAL
  Filled 2021-10-31 (×2): qty 2

## 2021-10-31 MED ORDER — DIPHENHYDRAMINE HCL 50 MG/ML IJ SOLN: 50.0000 mg | Freq: Four times a day (QID) | INTRAMUSCULAR | Status: DC | PRN

## 2021-10-31 MED ORDER — DIAZEPAM 5 MG PO TABS
5.0000 mg | ORAL_TABLET | Freq: Two times a day (BID) | ORAL | Status: DC
  Administered 2021-10-31 – 2021-11-07 (×12): 5 mg via ORAL
  Filled 2021-10-31 (×14): qty 1

## 2021-10-31 MED ORDER — MIRTAZAPINE 15 MG PO TABS: 15.0000 mg | ORAL_TABLET | Freq: Every day | ORAL | Status: DC

## 2021-10-31 MED ORDER — LORAZEPAM 0.5 MG PO TABS
0.5000 mg | ORAL_TABLET | Freq: Every day | ORAL | Status: DC | PRN
Start: 2021-10-31 — End: 2021-11-07
  Administered 2021-10-31: 0.5 mg via ORAL
  Filled 2021-10-31 (×2): qty 1

## 2021-10-31 MED ORDER — HALOPERIDOL 5 MG PO TABS
5.0000 mg | ORAL_TABLET | Freq: Four times a day (QID) | ORAL | Status: DC | PRN
Start: 2021-10-31 — End: 2021-11-07

## 2021-10-31 MED ORDER — HALOPERIDOL LACTATE 5 MG/ML IJ SOLN: 5.0000 mg | Freq: Four times a day (QID) | INTRAMUSCULAR | Status: DC | PRN

## 2021-10-31 MED ORDER — ARIPIPRAZOLE 5 MG PO TABS
5.0000 mg | ORAL_TABLET | Freq: Every day | ORAL | Status: DC
  Filled 2021-10-31: qty 1

## 2021-10-31 MED ORDER — PANTOPRAZOLE SODIUM 40 MG PO TBEC: 40.0000 mg | DELAYED_RELEASE_TABLET | Freq: Every day | ORAL | Status: DC

## 2021-10-31 MED ORDER — LITHIUM CARBONATE ER 300 MG PO TBCR
600.0000 mg | EXTENDED_RELEASE_TABLET | Freq: Every day | ORAL | Status: DC
  Administered 2021-11-02: 600 mg via ORAL
  Filled 2021-10-31 (×2): qty 2

## 2021-10-31 MED ORDER — MAGNESIUM HYDROXIDE 400 MG/5ML PO SUSP
30.0000 mL | Freq: Every day | ORAL | Status: DC | PRN
Start: 2021-10-31 — End: 2021-11-07
  Administered 2021-11-01: 30 mL via ORAL
  Filled 2021-10-31: qty 30

## 2021-10-31 MED ORDER — PANTOPRAZOLE SODIUM 40 MG PO TBEC
40.0000 mg | DELAYED_RELEASE_TABLET | Freq: Once | ORAL | Status: AC
  Administered 2021-10-31: 40 mg via ORAL
  Filled 2021-10-31: qty 1

## 2021-10-31 MED ORDER — METFORMIN HCL 500 MG PO TABS
500.0000 mg | ORAL_TABLET | Freq: Two times a day (BID) | ORAL | Status: DC
  Administered 2021-11-02: 500 mg via ORAL
  Filled 2021-10-31 (×7): qty 1

## 2021-10-31 MED ORDER — ACETAMINOPHEN 325 MG PO TABS
650.0000 mg | ORAL_TABLET | Freq: Four times a day (QID) | ORAL | Status: DC | PRN
  Administered 2021-11-03 – 2021-11-07 (×5): 650 mg via ORAL
  Filled 2021-10-31 (×5): qty 2

## 2021-10-31 MED ORDER — LORAZEPAM 0.5 MG PO TABS
0.5000 mg | ORAL_TABLET | Freq: Every day | ORAL | Status: DC | PRN
  Filled 2021-10-31: qty 1
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
## 2021-10-31 NOTE — ED Notes (Signed)
Pt has laid down in bed and has relaxed at this time ?

## 2021-10-31 NOTE — ED Notes (Signed)
IVC/Consult Completed/Pending Placement 

## 2021-10-31 NOTE — BH Assessment (Signed)
Referral information for Psychiatric Hospitalization faxed to: ° °[facility] ([phone]-or- [phone]) ° °[facility] ([phone]-or- [phone]),  ° °[facility] ([phone]), ° °[facility] ([phone]),  ° °[facility] ([phone] -or- [phone]),  ° °[facility] ([phone]). ° °[facility] ([phone]) ° °

## 2021-10-31 NOTE — Consult Note (Signed)
[facility] Face-to-Face Psychiatry Consult  ? ?Reason for Consult: Psychiatric Evaluation ?Referring Physician: Dr. Darnelle Catalan ?Patient Identification: Frances Sanders ?MRN:  [id number] ?Principal Diagnosis: <principal problem not specified> ?Diagnosis:  Active Problems: ?  Suicidal behavior ?  Severe recurrent major depression without psychotic features (HCC) ?  Benzodiazepine overdose ?  Tobacco use disorder ?  Anxiety ? ? ?Total Time spent with patient: 1 hour ? ?Subjective: "I got to go. I can't be here tonight." ?Frances Sanders is a 41 y.o. female patient presented to [facility] ED via law enforcement was initially voluntary, and the patient was placed under involuntary commitment status (IVC). It was reported that the patient was not eating or sleeping. The patient tried to help cook staff at the [employer], and she does not work there. The patient attempted to jump out of a car window. The patient denies all of the events that are listed above. ?This provider saw the patient face-to-face; the chart was reviewed, and consulted with Dr. Darnelle Catalan on 10/30/2021 due to the patient's care. It was discussed with EDP that the patient does not meet the criteria to be admitted to the psychiatric inpatient unit.  ?On evaluation, the patient is alert and oriented x 4, anxious, cooperative, and mood-congruent with affect. The patient does not appear to be responding to internal or external stimuli. The patient is presenting with some delusional thinking. The patient denies auditory or visual hallucinations. The patient denies any suicidal, homicidal, or self-harm ideations. The patient is presenting with some psychotic and paranoid behaviors. During an encounter with the patient, she could not answer questions appropriately. ? ?HPI: Per Dr. Darnelle Catalan, Frances Sanders is a 41 y.o. female who is brought in by [employer].  Reportedly she has not been eating or sleeping much.  She went to the [employer] and  went in the back and tried to help to cooks cooks the meals.  Then she tried to jump out of the car window and understand what was moving.  When I see her she is mumbling to herself as though she is talking to someone else is not there.  She does answer most of my questions without difficulty.  She may be responding to internal stimuli. ? ?Past Psychiatric History:  ?Anxiety ?Depression ? ?Risk to Self:   ?Risk to Others:   ?Prior Inpatient Therapy:   ?Prior Outpatient Therapy:   ? ?Past Medical History:  ?Past Medical History:  ?Diagnosis Date  ? Anxiety   ? Cough 06/07/2020  ? Depression   ? Diabetes mellitus without complication (HCC)   ? Gestational diabetes   ? Insomnia   ? Paranoid schizophrenia (HCC)   ? Sinus drainage 11/01/2020  ?  ?Past Surgical History:  ?Procedure Laterality Date  ? APPENDECTOMY    ? BIOPSY N/A 01/24/2021  ? Procedure: BIOPSY;  Surgeon: Toney Reil, MD;  [facility];  Service: Endoscopy;  Laterality: N/A;  ? COLONOSCOPY WITH PROPOFOL N/A 01/24/2021  ? Procedure: COLONOSCOPY WITH PROPOFOL;  Surgeon: Toney Reil, MD;  Location: [facility];  Service: Endoscopy;  Laterality: N/A;  ? HERNIA REPAIR    ? POLYPECTOMY N/A 01/24/2021  ? Procedure: POLYPECTOMY;  Surgeon: Toney Reil, MD;  Location: [facility];  Service: Endoscopy;  Laterality: N/A;  ? TUBAL LIGATION Bilateral 07/03/2016  ? Procedure: POST PARTUM TUBAL LIGATION BY SALPINGECTOMY;  Surgeon: Nadara Mustard, MD;  Location: [facility];  Service: Gynecology;  Laterality: Bilateral;  ? ?Family History:  ?  Family History  ?Problem Relation Age of Onset  ? Diabetes Paternal Aunt   ? Diabetes Paternal Uncle   ? Diabetes Maternal Grandmother   ? Heart disease Maternal Grandmother   ? Hypertension Maternal Grandmother   ? Colon cancer Maternal Grandmother   ? Diabetes Maternal Grandfather   ? Ovarian cancer Cousin 25  ?     ? accurate family history  ? Uterine cancer Cousin   ? ?Family  Psychiatric  History:  ?Social History:  ?Social History  ? ?Substance and Sexual Activity  ?Alcohol Use No  ?   ?Social History  ? ?Substance and Sexual Activity  ?Drug Use No  ?  ?Social History  ? ?Socioeconomic History  ? Marital status: Married  ?  Spouse name: Not on file  ? Number of children: Not on file  ? Years of education: Not on file  ? Highest education level: Not on file  ?Occupational History  ? Not on file  ?Tobacco Use  ? Smoking status: Every Day  ?  Packs/day: 0.50  ?  Types: Cigarettes  ? Smokeless tobacco: Never  ?Vaping Use  ? Vaping Use: Never used  ?Substance and Sexual Activity  ? Alcohol use: No  ? Drug use: No  ? Sexual activity: Yes  ?  Birth control/protection: Injection  ?Other Topics Concern  ? Not on file  ?Social History Narrative  ? Not on file  ? ?Social Determinants of Health  ? ?Financial Resource Strain: Not on file  ?Food Insecurity: Not on file  ?Transportation Needs: Not on file  ?Physical Activity: Not on file  ?Stress: Not on file  ?Social Connections: Not on file  ? ?Additional Social History: ?  ? ?Allergies:  No Known Allergies ? ?Labs:  ?Results for orders placed or performed during the hospital encounter of 10/30/21 (from the past 48 hour(s))  ?Comprehensive metabolic panel     Status: Abnormal  ? Collection Time: 10/30/21  9:54 PM  ?Result Value Ref Range  ? Sodium 132 (L) 135 - 145 mmol/L  ? Potassium 3.5 3.5 - 5.1 mmol/L  ? Chloride 99 98 - 111 mmol/L  ? CO2 20 (L) 22 - 32 mmol/L  ? Glucose, Bld 172 (H) 70 - 99 mg/dL  ?  Comment: Glucose reference range applies only to samples taken after fasting for at least 8 hours.  ? BUN 5 (L) 6 - 20 mg/dL  ? Creatinine, Ser 0.78 0.44 - 1.00 mg/dL  ? Calcium 9.3 8.9 - 10.3 mg/dL  ? Total Protein 8.2 (H) 6.5 - 8.1 g/dL  ? Albumin 4.9 3.5 - 5.0 g/dL  ? AST 26 15 - 41 U/L  ? ALT 11 0 - 44 U/L  ? Alkaline Phosphatase 67 38 - 126 U/L  ? Total Bilirubin 0.6 0.3 - 1.2 mg/dL  ? GFR, Estimated >60 >60 mL/min  ?  Comment:  (NOTE) ?Calculated using the CKD-EPI Creatinine Equation (2021) ?  ? Anion gap 13 5 - 15  ?  Comment: Performed at [facility], [address], [place], [place] [address]  ?Ethanol     Status: None  ? Collection Time: 10/30/21  9:54 PM  ?Result Value Ref Range  ? Alcohol, Ethyl (B) <10 <10 mg/dL  ?  Comment: (NOTE) ?Lowest detectable limit for serum alcohol is 10 mg/dL. ? ?For medical purposes only. ?Performed at [facility], [address] [place], ?[place] [address] ?  ?Salicylate level     Status: Abnormal  ?  Collection Time: 10/30/21  9:54 PM  ?Result Value Ref Range  ? Salicylate Lvl <7.0 (L) 7.0 - 30.0 mg/dL  ?  Comment: Performed at [facility], [address] [place], [place] [address]  ?Acetaminophen level     Status: Abnormal  ? Collection Time: 10/30/21  9:54 PM  ?Result Value Ref Range  ? Acetaminophen (Tylenol), Serum <10 (L) 10 - 30 ug/mL  ?  Comment: (NOTE) ?Therapeutic concentrations vary significantly. A range of 10-30 ug/mL  ?may be an effective concentration for many patients. However, some  ?are best treated at concentrations outside of this range. ?Acetaminophen concentrations >150 ug/mL at 4 hours after ingestion  ?and >50 ug/mL at 12 hours after ingestion are often associated with  ?toxic reactions. ? ?Performed at [facility], [address] [place], ?[place] [address] ?  ?cbc     Status: Abnormal  ? Collection Time: 10/30/21  9:54 PM  ?Result Value Ref Range  ? WBC 10.9 (H) 4.0 - 10.5 K/uL  ? RBC 4.53 3.87 - 5.11 MIL/uL  ? Hemoglobin 15.2 (H) 12.0 - 15.0 g/dL  ? HCT 45.2 36.0 - 46.0 %  ? MCV 99.8 80.0 - 100.0 fL  ? MCH 33.6 26.0 - 34.0 pg  ? MCHC 33.6 30.0 - 36.0 g/dL  ? RDW 12.8 11.5 - 15.5 %  ? Platelets 331 150 - 400 K/uL  ? nRBC 0.0 0.0 - 0.2 %  ?  Comment: Performed at [facility], [address] [place], [place] [address]  ?Urine Drug Screen, Qualitative     Status: Abnormal  ? Collection Time: 10/30/21  9:54 PM  ?Result Value  Ref Range  ? Tricyclic, Ur Screen NONE DETECTED NONE DETECTED  ? Amphetamines, Ur Screen NONE DETECTED NONE DETECTED  ? MDMA (Ecstasy)Ur Screen NONE DETECTED NONE DETECTED  ? Cocaine Metabolite,Ur Mesic NONE DETECTED NONE DETE

## 2021-10-31 NOTE — ED Notes (Addendum)
Report given to [place], RN in [facility] ?

## 2021-10-31 NOTE — BH Assessment (Signed)
Patient is to be admitted to [facility] by Psychiatric Nurse Practitioner Nanine Means.  ?Attending Physician will be Dr.  Toni Amend .   ?Patient has been assigned to room 306, by [facility] Charge Nurse [place].   ?ER staff is aware of the admission: ?Dr. Ovidio Kin, ER MD  ?Kathlen Mody, Patient's Nurse  ?Sue Lush, Patient Access. ? ?

## 2021-10-31 NOTE — BH Assessment (Signed)
Writer spoke with the patient to complete an updated/reassessment. Patient continues to be manic. ?

## 2021-10-31 NOTE — BHH Suicide Risk Assessment (Signed)
[facility] Admission Suicide Risk Assessment ? ? ?Nursing information obtained from:  Patient ?Demographic factors:  Caucasian ?Current Mental Status:  NA ?Loss Factors:  NA ?Historical Factors:  NA ?Risk Reduction Factors:  Responsible for children under 39 years of age, Employed, Positive social support ? ?Total Time spent with patient: 1 hour ?Principal Problem: Bipolar I disorder, most recent episode (or current) manic (HCC) ?Diagnosis:  Principal Problem: ?  Bipolar I disorder, most recent episode (or current) manic (HCC) ? ?Subjective Data: Patient seen and chart reviewed.  41 year old woman with a history of mood disorder comes in to the hospital after what sounds like an acute decompensation over the last few days with poor sleep agitated behavior culminating in an episode of agitated psychosis.  Patient denies any suicidal ideation.  Denies feeling depressed.  She is disorganized and paranoid.  Not hostile or threatening.  Patient does have a past history of suicide attempts during depressive episodes ? ?Continued Clinical Symptoms:  ?Alcohol Use Disorder Identification Test Final Score (AUDIT): 0 ?The "Alcohol Use Disorders Identification Test", Guidelines for Use in Primary Care, Second Edition.  World Science writer [facility]). ?Score between 0-7:  no or low risk or alcohol related problems. ?Score between 8-15:  moderate risk of alcohol related problems. ?Score between 16-19:  high risk of alcohol related problems. ?Score 20 or above:  warrants further diagnostic evaluation for alcohol dependence and treatment. ? ? ?CLINICAL FACTORS:  ? Bipolar Disorder:   Mixed State ? ? ?Musculoskeletal: ?Strength & Muscle Tone: within normal limits ?Gait & Station: normal ?Patient leans: N/A ? ?Psychiatric Specialty Exam: ? ?Presentation  ?General Appearance: Bizarre ? ?Eye Contact:Fleeting ? ?Speech:Pressured ? ?Speech Volume:Increased ? ?Handedness:Right ? ? ?Mood and Affect  ?Mood:Irritable; Euphoric;  Anxious ? ?Affect:Inappropriate ? ? ?Thought Process  ?Thought Processes:Disorganized; Irrevelant ? ?Descriptions of Associations:Loose ? ?Orientation:Full (Time, Place and Person) ? ?Thought Content:Illogical; Paranoid Ideation; Rumination; Scattered; Tangential ? ?History of Schizophrenia/Schizoaffective disorder:No ? ?Duration of Psychotic Symptoms:Greater than six months ? ?Hallucinations:Hallucinations: None ? ?Ideas of Reference:None ? ?Suicidal Thoughts:Suicidal Thoughts: No ? ?Homicidal Thoughts:Homicidal Thoughts: No ? ? ?Sensorium  ?Memory:Immediate Poor; Recent Poor; Remote Poor ? ?Judgment:Poor ? ?Insight:Poor ? ? ?Executive Functions  ?Concentration:Poor ? ?Attention Span:Poor ? ?Recall:Poor ? ?Fund of Knowledge:Poor ? ?Language:Poor ? ? ?Psychomotor Activity  ?Psychomotor Activity:Psychomotor Activity: Normal ? ? ?Assets  ?Assets:Communication Skills; Financial Resources/Insurance; Resilience; Social Support ? ? ?Sleep  ?Sleep:Sleep: Fair ?Number of Hours of Sleep: 6 ? ? ? ?Physical Exam: ?Physical Exam ?Vitals and nursing note reviewed.  ?Constitutional:   ?   Appearance: Normal appearance.  ?HENT:  ?   Head: Normocephalic and atraumatic.  ?   Mouth/Throat:  ?   Pharynx: Oropharynx is clear.  ?Eyes:  ?   Pupils: Pupils are equal, round, and reactive to light.  ?Cardiovascular:  ?   Rate and Rhythm: Normal rate and regular rhythm.  ?Pulmonary:  ?   Effort: Pulmonary effort is normal.  ?   Breath sounds: Normal breath sounds.  ?Abdominal:  ?   General: Abdomen is flat.  ?   Palpations: Abdomen is soft.  ?Musculoskeletal:     ?   General: Normal range of motion.  ?Skin: ?   General: Skin is warm and dry.  ?Neurological:  ?   General: No focal deficit present.  ?   Mental Status: She is alert. Mental status is at baseline.  ?Psychiatric:     ?   Attention and Perception: She is inattentive.     ?  Mood and Affect: Mood normal. Affect is labile and inappropriate.     ?   Speech: Speech is rapid and  pressured and tangential.     ?   Behavior: Behavior is agitated. Behavior is not aggressive.     ?   Thought Content: Thought content is paranoid. Thought content does not include homicidal or suicidal ideation.     ?   Cognition and Memory: Cognition is impaired. Memory is impaired.     ?   Judgment: Judgment is impulsive and inappropriate.  ? ?Review of Systems  ?Constitutional: Negative.   ?HENT: Negative.    ?Eyes: Negative.   ?Respiratory: Negative.    ?Cardiovascular: Negative.   ?Gastrointestinal: Negative.   ?Musculoskeletal: Negative.   ?Skin: Negative.   ?Neurological: Negative.   ?Psychiatric/Behavioral:  Positive for memory loss. Negative for depression, hallucinations, substance abuse and suicidal ideas. The patient is nervous/anxious and has insomnia.   ?Blood pressure 131/88, pulse 82, temperature (!) 97.5 ?F (36.4 ?C), temperature source Oral, resp. rate 17, height 5' (1.524 m), weight 49 kg, SpO2 100 %, unknown if currently breastfeeding. Body mass index is 21.09 kg/m?. ? ? ?COGNITIVE FEATURES THAT CONTRIBUTE TO RISK:  ?Polarized thinking   ? ?SUICIDE RISK:  ? Mild:  Suicidal ideation of limited frequency, intensity, duration, and specificity.  There are no identifiable plans, no associated intent, mild dysphoria and related symptoms, good self-control (both objective and subjective assessment), few other risk factors, and identifiable protective factors, including available and accessible social support. ? ?PLAN OF CARE: Continue 15-minute checks.  Try to get her started on appropriate psychiatric medicine.  As needed medicine available.  Collateral to be gained.  Treatment team will work on appropriate medical and social treatment.  Ongoing assessment of dangerousness prior to discharge ? ?I certify that inpatient services furnished can reasonably be expected to improve the patient's condition.  ? ?Mordecai Rasmussen, MD ?10/31/2021, 6:31 PM ? ?

## 2021-10-31 NOTE — Tx Team (Signed)
Initial Treatment Plan ?10/31/2021 ?4:51 PM ?[name] ?PIR:[id number] ? ? ? ?PATIENT STRESSORS: ?Marital or family conflict   ? ? ?PATIENT STRENGTHS: ?Supportive family/friends  ? ? ?PATIENT IDENTIFIED PROBLEMS: ?Family conflict, medication non-compliance  ?  ?  ?  ?  ?  ?  ?  ?  ?  ? ?DISCHARGE CRITERIA:  ?Improved stabilization in mood, thinking, and/or behavior ? ?PRELIMINARY DISCHARGE PLAN: ?Return to previous living arrangement ? ?PATIENT/FAMILY INVOLVEMENT: ?This treatment plan has been presented to and reviewed with the patient, Frances Sanders. The patient and family have been given the opportunity to ask questions and make suggestions. ? ?Hyman Hopes, RN ?10/31/2021, 4:51 PM ?

## 2021-10-31 NOTE — Progress Notes (Signed)
Patient presents with a bizarre affect, disorganized thinking and with a delusional thought pattern. Patient observed interacting appropriately with staff and peers on the unit. Patient refused her scheduled Lithium tonight. Pt did take some PRN medications. Pt given education, support, and encouragement to be active in her treatment plan. Pt being monitored Q 15 minutes for safety per unit protocol. Pt remains safe on the unit.  ?

## 2021-10-31 NOTE — BH Assessment (Signed)
Comprehensive Clinical Assessment (CCA) Note ? ?10/31/2021 ?Leafy KindleStephanie E Blakney ?[id number] ?Elson AreasStephanie Rorke is a 41 year old, English speaking, Caucasian female with a hx of MDD without psychotic features, anxiety, and mania. 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 [facility] due to exhibiting manic/impulsive behavior. Pt has since been IVC'd by the EDP. Upon assessment, pt was disorganized with flight of ideas. When asked why she'd been brought to the ED the pt stated, ?I have worked about 63 hours of overtime.? Pt was slightly resistant throughout the assessment and focused on irrelevancies. Of note pt's account of events and her history were nonlinear, contradictory, and difficult to follow.Pt was impulsive throughout the assessment, making sexually inappropriate comments. Pt. had pressured speech and restless psychomotor activity. The pt. had no insight and poor judgement. Pt did not appear to be responding to internal or external stimuli. Pt presented with a hypomanic mood; affect was labile. Pt denied SI/HI/AV/H. Pt denied being depressed. ? ?Chief Complaint:  ?Chief Complaint  ?Patient presents with  ? Psychiatric Evaluation  ? ?Visit Diagnosis: Suicidal behavior ?  Severe recurrent major depression without psychotic features (HCC) ?  Benzodiazepine overdose ?  Tobacco use disorder ?  Anxiety  ? ? ?CCA Screening, Triage and Referral (STR) ? ?Patient Reported Information ?How did you hear about us? Family/Friend ? ?Referral name: No data recorded ?Referral phone number: No data recorded ? ?Whom do you see for routine medical problems? No data recorded ?Practice/Facility Name: No data recorded ?Practice/Facility Phone Number: No data recorded ?Name of Contact: No data recorded ?Contact Number: No data recorded ?Contact Fax Number: No data recorded ?Prescriber Name: No data recorded ?Prescriber Address (if known): No data  recorded ? ?What Is the Reason for Your Visit/Call Today? Pt brought in by bpd.  Pt is voluntary.  Pt not eating or sleeping.    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 ? ?How Long Has This Been Causing You Problems? <Week ? ?What Do You Feel Would Help You the Most Today? Treatment for Depression or other mood problem; Medication(s) ? ? ?Have You Recently Been in Any Inpatient Treatment (Hospital/Detox/Crisis Center/28-Day Program)? No data recorded ?Name/Location of Program/Hospital:No data recorded ?How Long Were You There? No data recorded ?When Were You Discharged? No data recorded ? ?Have You Ever Received Services From [facility] Before? No data recorded ?Who [facility]? No data recorded ? ?Have You Recently Had Any Thoughts About Hurting Yourself? No ? ?Are You Planning to Commit Suicide/Harm Yourself At This time? No ? ? ?Have you Recently Had Thoughts About Hurting Someone Karolee Ohslse? No ? ?Explanation: No data recorded ? ?Have You Used Any Alcohol or Drugs in the Past 24 Hours? No ? ?How Long Ago Did You Use Drugs or Alcohol? No data recorded ?What Did You Use and How Much? No data recorded ? ?Do You Currently Have a Therapist/Psychiatrist? No ? ?Name of Therapist/Psychiatrist: No data recorded ? ?Have You Been Recently Discharged From Any Office Practice or Programs? No ? ?Explanation of Discharge From Practice/Program: No data recorded ? ?  ?CCA Screening Triage Referral Assessment ?Type of Contact: Face-to-Face ? ?Is this Initial or Reassessment? No data recorded ?Date Telepsych consult ordered in CH[facility]:  No data recorded ?Time Telepsych consult ordered in CHL:  No data recorded ? ?Patient Reported Information Reviewed? No data recorded ?Patient  Left Without Being Seen? No data recorded ?Reason for Not Completing Assessment: No data recorded ? ?Collateral Involvement: None provided ? ? ?Does Patient Have a Automotive engineer Guardian? No data  recorded ?Name and Contact of Legal Guardian: No data recorded ?If Minor and Not Living with Parent(s), Who has Custody? n/a ? ?Is CPS involved or ever been involved? Never ? ?Is APS involved or ever been involved? Never ? ? ?Patient Determined To Be At Risk for Harm To Self or Others Based on Review of Patient Reported Information or Presenting Complaint? No ? ?Method: No data recorded ?Availability of Means: No data recorded ?Intent: No data recorded ?Notification Required: No data recorded ?Additional Information for Danger to Others Potential: No data recorded ?Additional Comments for Danger to Others Potential: No data recorded ?Are There Guns or Other Weapons in Your Home? No data recorded ?Types of Guns/Weapons: No data recorded ?Are These Weapons Safely Secured?                            No data recorded ?Who Could Verify You Are Able To Have These Secured: No data recorded ?Do You Have any Outstanding Charges, Pending Court Dates, Parole/Probation? No data recorded ?Contacted To Inform of Risk of Harm To Self or Others: No data recorded ? ?Location of Assessment: [facility] ED ? ? ?Does Patient Present under Involuntary Commitment? Yes ? ?IVC Papers Initial File Date: 10/30/21 ? ? ?[place] of Residence: [place] ? ? ?Patient Currently Receiving the Following Services: Not Receiving Services ? ? ?Determination of Need: Emergent (2 hours) ? ? ?Options For Referral: Inpatient Hospitalization ? ? ? ? ?CCA Biopsychosocial ?Intake/Chief Complaint:  No data recorded ?Current Symptoms/Problems: No data recorded ? ?Patient Reported Schizophrenia/Schizoaffective Diagnosis in Past: No ? ? ?Strengths: Pt has a supportive family ? ?Preferences: No data recorded ?Abilities: No data recorded ? ?Type of Services Patient Feels are Needed: No data recorded ? ?Initial Clinical Notes/Concerns: No data recorded ? ?Mental Health Symptoms ?Depression:   ?None ?  ?Duration of Depressive symptoms: No data recorded  ?Mania:   ?Increased  Energy; Irritability; Racing thoughts; Change in energy/activity; Recklessness ?  ?Anxiety:    ?Worrying; Tension ?  ?Psychosis:   ?Grossly disorganized speech; Delusions ?  ?Duration of Psychotic symptoms:  ?Greater than six months ?  ?Trauma:   ?N/A ?  ?Obsessions:   ?N/A ?  ?Compulsions:   ?N/A ?  ?Inattention:   ?N/A ?  ?Hyperactivity/Impulsivity:   ?Talks excessively; Always on the go; Feeling of restlessness; Fidgets with hands/feet; Blurts out answers ?  ?Oppositional/Defiant Behaviors:   ?None ?  ?Emotional Irregularity:   ?Potentially harmful impulsivity; Mood lability ?  ?Other Mood/Personality Symptoms:  No data recorded  ? ?Mental Status Exam ?Appearance and self-care  ?Stature:   ?Small ?  ?Weight:   ?Average weight ?  ?Clothing:   ?-- (Scrubs) ?  ?Grooming:   ?Well-groomed ?  ?Cosmetic use:   ?Age appropriate ?  ?Posture/gait:   ?Normal ?  ?Motor activity:   ?Restless ?  ?Sensorium  ?Attention:   ?Distractible ?  ?Concentration:   ?Scattered; Focuses on irrelevancies ?  ?Orientation:   ?X5 ?  ?Recall/memory:   ?Defective in Recent ?  ?Affect and Mood  ?Affect:   ?Anxious; Labile ?  ?Mood:   ?Hypomania ?  ?Relating  ?Eye contact:   ?Normal ?  ?Facial expression:   ?Responsive ?  ?Attitude toward examiner:   ?Cooperative ?  ?  Thought and Language  ?Speech flow:  ?Pressured; Flight of Ideas ?  ?Thought content:   ?Appropriate to Mood and Circumstances ?  ?Preoccupation:   ?None ?  ?Hallucinations:   ?None ?  ?Organization:  No data recorded  ?Executive Functions  ?Fund of Knowledge:   ?Average ?  ?Intelligence:   ?Average ?  ?Abstraction:   ?Normal ?  ?Judgement:   ?Poor ?  ?Reality Testing:   ?Unaware ?  ?Insight:   ?None/zero insight ?  ?Decision Making:   ?Impulsive ?  ?Social Functioning  ?Social Maturity:   ?Impulsive ?  ?Social Judgement:   ?Impropriety ?  ?Stress  ?Stressors:   ?Transitions; Work ?  ?Coping Ability:   ?Overwhelmed ?  ?Skill Deficits:   ?Self-control ?  ?Supports:   ?Friends/Service  system; Family; Church; Other (Comment) ?  ? ? ?Religion: ?Religion/Spirituality ?Are You A Religious Person?: Yes ?What is Your Religious Affiliation?: Ephriam Knuckles ?How Might This Affect Treatment?: n/a ? ?[place]

## 2021-10-31 NOTE — ED Notes (Signed)
Pt given snack. 

## 2021-10-31 NOTE — ED Notes (Signed)
Patient transferred from ED to [facility] room 1 after screening for contraband. Report received from [place], RN including Situation, Background, Assessment and Recommendations. Pt oriented to unit including Q15 minute rounds as well as the security cameras for their protection. Patient is alert and oriented, warm and dry in no acute distress. Patient denies SI, HI, and AVH. Pt. Encouraged to let this nurse know if needs arise.  ?

## 2021-10-31 NOTE — Progress Notes (Signed)
Patient ID: Frances Sanders, female   DOB: December 02, 1980, 41 y.o.   MRN: [id number] ?Admission note: ?Patient is a 41 year old female, presents IVC per report major depressive disorder, single episode, severe, with psychosis. Patient is alert and oriented to unit. Patients states she is anxious. Patient affect is labile. Patient is cooperative during assessment questions.   ?Patient denies stressors related to admission at this time. Patient states she is unaware of why she had to be admitted to the unit.  ?Patient currently denies SI/HI/AVH. ?Unit policies explained and verbalized understanding. Q15 minute checks maintained and will continue to monitor.   ?

## 2021-10-31 NOTE — H&P (Signed)
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  41 year old woman brought to the emergency room by police after an episode of very agitated behavior.  It is reported that the patient was agitated and going behind the counter at restaurants trying to help him cook, jumping out of a car, displaying agitation and not sleeping for 3 days.  Patient appeared to be manic and disorganized in the emergency room.  On interview today the patient denies all of the allegations about her behavior.  She claims that she cannot remember anything about what happened the night she was brought to the emergency room.  She does acknowledge that she has not slept for 3 days.  She has no particular excuse or explanation for that.  Patient says her mood has been fine.  She says she has been compliant with her normally prescribed medication which is mirtazapine and diazepam.  Denies any substance abuse.  Denies suicidal or homicidal ideation.  Patient presents as grandiose hyperverbal disorganized with flight of ideas and paranoia Associated Signs/Symptoms: Depression Symptoms:  psychomotor agitation, disturbed sleep, Duration of Depression Symptoms: No data recorded (Hypo) Manic Symptoms:  Elevated Mood, Flight of Ideas, Grandiosity, Impulsivity, Labiality of Mood, Anxiety Symptoms:  Excessive Worry, Psychotic Symptoms:  Paranoia, PTSD Symptoms: Negative Total Time spent with patient: 1 hour  Past Psychiatric History: Patient has a past history of mood disorder.  We have seen her admitted to  the hospital on 2 previous occasion once in 2014 once in 2017.  In 2017 she was severely depressed and had a suicide attempt.  In 2014 she had presented with a transient psychotic episode.  Most recent psychiatric treatment has been with Dr. Mila Palmer who has been prescribing Valium 5 mg 2 a day and Remeron 15 mg at night.  Patient does have a past history as mentioned of suicide attempts.  Not clear that she has ever been diagnosed with bipolar disorder.  Is the patient at risk to self? Yes.    Has the patient been a risk to self in the past 6 months? No.  Has the patient been a risk to self within the distant past? Yes.    Is the patient a risk to others? No.  Has the patient been a risk to others in the past 6 months? No.  Has the patient been a risk to others within the distant past? No.   Prior Inpatient Therapy:   Prior Outpatient Therapy:    Alcohol Screening: Patient refused Alcohol Screening Tool: Yes 1. How often do you have a drink containing alcohol?: Never 2. How many drinks containing alcohol do you have on a typical day when you are drinking?: 1 or 2 3. How often do you have six or more drinks on one occasion?: Never AUDIT-C Score: 0 4. How often during the last year have you found that you were not able to stop drinking once you had started?: Never 5. How often during the last year have you failed to do what was normally expected from you because of drinking?: Never 6. How often during the last year have you needed a first drink in the  morning to get yourself going after a heavy drinking session?: Never 7. How often during the last year have you had a feeling of guilt of remorse after drinking?: Never 8. How often during the last year have you been unable to remember what happened the night before because you had been drinking?: Never 9. Have you or someone else been injured as a result of your drinking?: No 10. Has a relative or friend or a doctor or another health worker been  concerned about your drinking or suggested you cut down?: No Alcohol Use Disorder Identification Test Final Score (AUDIT): 0 Substance Abuse History in the last 12 months:  No. Consequences of Substance Abuse: Negative Previous Psychotropic Medications: Yes  Psychological Evaluations: Yes  Past Medical History:  Past Medical History:  Diagnosis Date   Anxiety    Cough 06/07/2020   Depression    Diabetes mellitus without complication (HCC)    Gestational diabetes    Insomnia    Paranoid schizophrenia (HCC)    Sinus drainage 11/01/2020    Past Surgical History:  Procedure Laterality Date   APPENDECTOMY     BIOPSY N/A 01/24/2021   Procedure: BIOPSY;  Surgeon: Toney Reil, MD;  Location: [facility];  Service: Endoscopy;  Laterality: N/A;   COLONOSCOPY WITH PROPOFOL N/A 01/24/2021   Procedure: COLONOSCOPY WITH PROPOFOL;  Surgeon: Toney Reil, MD;  Location: [facility];  Service: Endoscopy;  Laterality: N/A;   HERNIA REPAIR     POLYPECTOMY N/A 01/24/2021   Procedure: POLYPECTOMY;  Surgeon: Toney Reil, MD;  Location: [facility];  Service: Endoscopy;  Laterality: N/A;   TUBAL LIGATION Bilateral 07/03/2016   Procedure: POST PARTUM TUBAL LIGATION BY SALPINGECTOMY;  Surgeon: Nadara Mustard, MD;  Location: [facility];  Service: Gynecology;  Laterality: Bilateral;   Family History:  Family History  Problem Relation Age of Onset   Diabetes Paternal Aunt    Diabetes Paternal Uncle    Diabetes Maternal Grandmother    Heart disease Maternal Grandmother    Hypertension Maternal Grandmother    Colon cancer Maternal Grandmother    Diabetes Maternal Grandfather    Ovarian cancer Cousin 25       ? accurate family history   Uterine cancer Cousin    Family Psychiatric  History: Multiple people with anxiety Tobacco Screening:   Social History:  Social History   Substance and Sexual Activity  Alcohol Use No     Social History   Substance and  Sexual Activity  Drug Use No    Additional Social History:                           Allergies:  No Known Allergies Lab Results:  Results for orders placed or performed during the hospital encounter of 10/30/21 (from the past 48 hour(s))  Comprehensive metabolic panel     Status: Abnormal   Collection Time: 10/30/21  9:54 PM  Result Value Ref Range   Sodium 132 (L) 135 - 145 mmol/L   Potassium 3.5 3.5 - 5.1 mmol/L   Chloride 99 98 - 111 mmol/L   CO2 20 (L) 22 - 32 mmol/L   Glucose, Bld 172 (H) 70 - 99 mg/dL    Comment: Glucose reference range applies only to samples taken after fasting for at least 8 hours.   BUN 5 (L) 6 - 20 mg/dL   Creatinine, Ser 0.98 0.44 - 1.00 mg/dL   Calcium  9.3 8.9 - 10.3 mg/dL   Total Protein 8.2 (H) 6.5 - 8.1 g/dL   Albumin 4.9 3.5 - 5.0 g/dL   AST 26 15 - 41 U/L   ALT 11 0 - 44 U/L   Alkaline Phosphatase 67 38 - 126 U/L   Total Bilirubin 0.6 0.3 - 1.2 mg/dL   GFR, Estimated >02 >72 mL/min    Comment: (NOTE) Calculated using the CKD-EPI Creatinine Equation (2021)    Anion gap 13 5 - 15    Comment: Performed at [facility], [address] [place], [place] [address]  Ethanol     Status: None   Collection Time: 10/30/21  9:54 PM  Result Value Ref Range   Alcohol, Ethyl (B) <10 <10 mg/dL    Comment: (NOTE) Lowest detectable limit for serum alcohol is 10 mg/dL.  For medical purposes only. Performed at [facility], [address] [place], [place] [address]   Salicylate level     Status: Abnormal   Collection Time: 10/30/21  9:54 PM  Result Value Ref Range   Salicylate Lvl <7.0 (L) 7.0 - 30.0 mg/dL    Comment: Performed at [facility], [address] [place], [place] [address]  Acetaminophen level     Status: Abnormal   Collection Time: 10/30/21  9:54 PM  Result Value Ref Range   Acetaminophen (Tylenol), Serum <10 (L) 10 - 30 ug/mL    Comment: (NOTE) Therapeutic concentrations vary significantly.  A range of 10-30 ug/mL  may be an effective concentration for many patients. However, some  are best treated at concentrations outside of this range. Acetaminophen concentrations >150 ug/mL at 4 hours after ingestion  and >50 ug/mL at 12 hours after ingestion are often associated with  toxic reactions.  Performed at [facility], [address] [place], [place] [address]   cbc     Status: Abnormal   Collection Time: 10/30/21  9:54 PM  Result Value Ref Range   WBC 10.9 (H) 4.0 - 10.5 K/uL   RBC 4.53 3.87 - 5.11 MIL/uL   Hemoglobin 15.2 (H) 12.0 - 15.0 g/dL   HCT 64.3 32.9 - 51.8 %   MCV 99.8 80.0 - 100.0 fL   MCH 33.6 26.0 - 34.0 pg   MCHC 33.6 30.0 - 36.0 g/dL   RDW 84.1 66.0 - 63.0 %   Platelets 331 150 - 400 K/uL   nRBC 0.0 0.0 - 0.2 %    Comment: Performed at [facility], [address], [place], [place] [address]  Urine Drug Screen, Qualitative     Status: Abnormal   Collection Time: 10/30/21  9:54 PM  Result Value Ref Range   Tricyclic, Ur Screen NONE DETECTED NONE DETECTED   Amphetamines, Ur Screen NONE DETECTED NONE DETECTED   MDMA (Ecstasy)Ur Screen NONE DETECTED NONE DETECTED   Cocaine Metabolite,Ur [place] NONE DETECTED NONE DETECTED   Opiate, Ur Screen NONE DETECTED NONE DETECTED   Phencyclidine (PCP) Ur S NONE DETECTED NONE DETECTED   Cannabinoid 50 Ng, Ur  NONE DETECTED NONE DETECTED   Barbiturates, Ur Screen NONE DETECTED NONE DETECTED   Benzodiazepine, Ur Scrn POSITIVE (A) NONE DETECTED   Methadone Scn, Ur NONE DETECTED NONE DETECTED    Comment: (NOTE) Tricyclics + metabolites, urine    Cutoff 1000 ng/mL Amphetamines + metabolites, urine  Cutoff 1000 ng/mL MDMA (Ecstasy), urine              Cutoff 500 ng/mL Cocaine Metabolite, urine  Cutoff 300 ng/mL Opiate + metabolites, urine        Cutoff 300 ng/mL Phencyclidine (PCP), urine         Cutoff 25 ng/mL Cannabinoid, urine                 Cutoff 50 ng/mL Barbiturates + metabolites,  urine  Cutoff 200 ng/mL Benzodiazepine, urine              Cutoff 200 ng/mL Methadone, urine                   Cutoff 300 ng/mL  The urine drug screen provides only a preliminary, unconfirmed analytical test result and should not be used for non-medical purposes. Clinical consideration and professional judgment should be applied to any positive drug screen result due to possible interfering substances. A more specific alternate chemical method must be used in order to obtain a confirmed analytical result. Gas chromatography / mass spectrometry (GC/MS) is the preferred confirm atory method. Performed at [facility], [address] [place], [place] [address]   Pregnancy, urine     Status: None   Collection Time: 10/30/21  9:54 PM  Result Value Ref Range   Preg Test, Ur NEGATIVE NEGATIVE    Comment: Performed at [facility], [address] [place], [place] [address]  Urinalysis, Routine w reflex microscopic     Status: Abnormal   Collection Time: 10/30/21  9:54 PM  Result Value Ref Range   Color, Urine STRAW (A) YELLOW   APPearance CLEAR (A) CLEAR   Specific Gravity, Urine 1.002 (L) 1.005 - 1.030   pH 6.0 5.0 - 8.0   Glucose, UA 150 (A) NEGATIVE mg/dL   Hgb urine dipstick NEGATIVE NEGATIVE   Bilirubin Urine NEGATIVE NEGATIVE   Ketones, ur NEGATIVE NEGATIVE mg/dL   Protein, ur NEGATIVE NEGATIVE mg/dL   Nitrite NEGATIVE NEGATIVE   Leukocytes,Ua NEGATIVE NEGATIVE    Comment: Performed at [facility], [address] [place], [place] [address]  TSH     Status: None   Collection Time: 10/30/21  9:54 PM  Result Value Ref Range   TSH 1.874 0.350 - 4.500 uIU/mL    Comment: Performed by a 3rd Generation assay with a functional sensitivity of <=0.01 uIU/mL. Performed at [facility], [address] [place], [place] [address]   POC urine preg, ED     Status: None   Collection Time: 10/30/21  9:56 PM  Result Value Ref Range   Preg Test, Ur  NEGATIVE NEGATIVE    Comment:        THE SENSITIVITY OF THIS METHODOLOGY IS >24 mIU/mL   Resp Panel by RT-PCR (Flu A&B, Covid) Nasopharyngeal Swab     Status: None   Collection Time: 10/30/21 11:06 PM   Specimen: Nasopharyngeal Swab; Nasopharyngeal(NP) swabs in vial transport medium  Result Value Ref Range   SARS Coronavirus 2 by RT PCR NEGATIVE NEGATIVE    Comment: (NOTE) SARS-CoV-2 target nucleic acids are NOT DETECTED.  The SARS-CoV-2 RNA is generally detectable in upper respiratory specimens during the acute phase of infection. The lowest concentration of SARS-CoV-2 viral copies this assay can detect is 138 copies/mL. A negative result does not preclude SARS-Cov-2 infection and should not be used as the sole basis for treatment or other patient management decisions. A negative result may occur with  improper specimen collection/handling, submission of specimen other than nasopharyngeal swab, presence of viral mutation(s) within the areas targeted by this assay,  and inadequate number of viral copies(<138 copies/mL). A negative result must be combined with clinical observations, patient history, and epidemiological information. The expected result is Negative.  Fact Sheet for Patients:  BloggerCourse.com  Fact Sheet for Healthcare Providers:  SeriousBroker.it  This test is no t yet approved or cleared by the [place] FDA and  has been authorized for detection and/or diagnosis of SARS-CoV-2 by FDA under an Emergency Use Authorization (EUA). This EUA will remain  in effect (meaning this test can be used) for the duration of the COVID-19 declaration under Section 564(b)(1) of the Act, 21 U.S.C.section 360bbb-3(b)(1), unless the authorization is terminated  or revoked sooner.       Influenza A by PCR NEGATIVE NEGATIVE   Influenza B by PCR NEGATIVE NEGATIVE    Comment: (NOTE) The Xpert Xpress SARS-CoV-2/FLU/RSV plus assay  is intended as an aid in the diagnosis of influenza from Nasopharyngeal swab specimens and should not be used as a sole basis for treatment. Nasal washings and aspirates are unacceptable for Xpert Xpress SARS-CoV-2/FLU/RSV testing.  Fact Sheet for Patients: BloggerCourse.com  Fact Sheet for Healthcare Providers: SeriousBroker.it  This test is not yet approved or cleared by the [place] FDA and has been authorized for detection and/or diagnosis of SARS-CoV-2 by FDA under an Emergency Use Authorization (EUA). This EUA will remain in effect (meaning this test can be used) for the duration of the COVID-19 declaration under Section 564(b)(1) of the Act, 21 U.S.C. section 360bbb-3(b)(1), unless the authorization is terminated or revoked.  Performed at [facility], [address] [place], [place] [address]     Blood Alcohol level:  Lab Results  Component Value Date   [facility] <10 10/30/2021   ETH <5 09/06/2015    Metabolic Disorder Labs:  Lab Results  Component Value Date   HGBA1C 5.5 09/07/2015   Lab Results  Component Value Date   PROLACTIN 14.9 09/07/2015   Lab Results  Component Value Date   CHOL 166 09/13/2019   TRIG 159 (H) 09/13/2019   HDL 47 09/13/2019   CHOLHDL 3.5 09/13/2019   VLDL 21 09/07/2015   LDLCALC 91 09/13/2019   LDLCALC 102 (H) 09/07/2015    Current Medications: Current Facility-Administered Medications  Medication Dose Route Frequency Provider Last Rate Last Admin   acetaminophen (TYLENOL) tablet 650 mg  650 mg Oral Q6H PRN Charm Rings, NP       alum & mag hydroxide-simeth (MAALOX/MYLANTA) 200-200-20 MG/5ML suspension 30 mL  30 mL Oral Q4H PRN Charm Rings, NP       ARIPiprazole (ABILIFY) tablet 5 mg  5 mg Oral Daily Lord, Jamison Y, NP       diazepam (VALIUM) tablet 5 mg  5 mg Oral BID Donterius Filley, Jackquline Denmark, MD       diphenhydrAMINE (BENADRYL) capsule 50 mg  50 mg Oral Q6H PRN  Tonika Eden, Jackquline Denmark, MD       Or   diphenhydrAMINE (BENADRYL) injection 50 mg  50 mg Intramuscular Q6H PRN Jahmire Ruffins T, MD       haloperidol (HALDOL) tablet 5 mg  5 mg Oral Q6H PRN Pama Roskos T, MD       Or   haloperidol lactate (HALDOL) injection 5 mg  5 mg Intramuscular Q6H PRN Kaimani Clayson T, MD       lithium carbonate (LITHOBID) CR tablet 600 mg  600 mg Oral QHS Trevion Hoben, Jackquline Denmark, MD       LORazepam (ATIVAN) tablet 0.5  mg  0.5 mg Oral Daily PRN Charm Rings, NP       magnesium hydroxide (MILK OF MAGNESIA) suspension 30 mL  30 mL Oral Daily PRN Charm Rings, NP       metFORMIN (GLUCOPHAGE) tablet 500 mg  500 mg Oral BID WC Charm Rings, NP       nicotine polacrilex (NICORETTE) gum 2 mg  2 mg Oral PRN Giada Schoppe, Jackquline Denmark, MD       PTA Medications: Medications Prior to Admission  Medication Sig Dispense Refill Last Dose   ARIPiprazole (ABILIFY) 5 MG tablet Take 5 mg by mouth daily. (Patient not taking: Reported on 10/30/2021)      diazepam (VALIUM) 5 MG tablet Take 5 mg by mouth every 6 (six) hours as needed for anxiety.      metFORMIN (GLUCOPHAGE) 500 MG tablet 500 mg 2 (two) times daily. (Patient not taking: Reported on 10/30/2021)      mirtazapine (REMERON) 15 MG tablet Take 15 mg by mouth at bedtime.  2    omeprazole (PRILOSEC) 20 MG capsule TAKE 1 CAPSULE BY MOUTH DAILY 30 capsule 11     Musculoskeletal: Strength & Muscle Tone: within normal limits Gait & Station: normal Patient leans: N/A            Psychiatric Specialty Exam:  Presentation  General Appearance: Bizarre  Eye Contact:Fleeting  Speech:Pressured  Speech Volume:Increased  Handedness:Right   Mood and Affect  Mood:Irritable; Euphoric; Anxious  Affect:Inappropriate   Thought Process  Thought Processes:Disorganized; Irrevelant  Duration of Psychotic Symptoms: Greater than six months  Past Diagnosis of Schizophrenia or Psychoactive disorder: No  Descriptions of  Associations:Loose  Orientation:Full (Time, Place and Person)  Thought Content:Illogical; Paranoid Ideation; Rumination; Scattered; Tangential  Hallucinations:Hallucinations: None  Ideas of Reference:None  Suicidal Thoughts:Suicidal Thoughts: No  Homicidal Thoughts:Homicidal Thoughts: No   Sensorium  Memory:Immediate Poor; Recent Poor; Remote Poor  Judgment:Poor  Insight:Poor   Executive Functions  Concentration:Poor  Attention Span:Poor  Recall:Poor  Fund of Knowledge:Poor  Language:Poor   Psychomotor Activity  Psychomotor Activity:Psychomotor Activity: Normal   Assets  Assets:Communication Skills; Financial Resources/Insurance; Resilience; Social Support   Sleep  Sleep:Sleep: Fair Number of Hours of Sleep: 6    Physical Exam: Physical Exam Vitals and nursing note reviewed.  Constitutional:      Appearance: Normal appearance.  HENT:     Head: Normocephalic and atraumatic.     Mouth/Throat:     Pharynx: Oropharynx is clear.  Eyes:     Pupils: Pupils are equal, round, and reactive to light.  Cardiovascular:     Rate and Rhythm: Normal rate and regular rhythm.  Pulmonary:     Effort: Pulmonary effort is normal.     Breath sounds: Normal breath sounds.  Abdominal:     General: Abdomen is flat.     Palpations: Abdomen is soft.  Musculoskeletal:        General: Normal range of motion.  Skin:    General: Skin is warm and dry.  Neurological:     General: No focal deficit present.     Mental Status: She is alert. Mental status is at baseline.  Psychiatric:        Attention and Perception: She is inattentive.        Mood and Affect: Affect is labile and inappropriate.        Speech: Speech is rapid and pressured and tangential.        Behavior: Behavior is agitated. Behavior  is not aggressive.        Thought Content: Thought content is paranoid.        Cognition and Memory: Cognition normal. Memory is impaired.        Judgment: Judgment is  impulsive and inappropriate.   Review of Systems  Constitutional: Negative.   HENT: Negative.    Eyes: Negative.   Respiratory: Negative.    Cardiovascular: Negative.   Gastrointestinal: Negative.   Musculoskeletal: Negative.   Skin: Negative.   Neurological: Negative.   Psychiatric/Behavioral:  Positive for memory loss. Negative for depression, hallucinations, substance abuse and suicidal ideas. The patient is nervous/anxious and has insomnia.   Blood pressure 131/88, pulse 82, temperature (!) 97.5 F (36.4 C), temperature source Oral, resp. rate 17, height 5' (1.524 m), weight 49 kg, SpO2 100 %, unknown if currently breastfeeding. Body mass index is 21.09 kg/m.  Treatment Plan Summary: Medication management and Plan patient is currently presenting with symptoms typical of acute mania with hyper verbality grandiosity pressured speech paranoia flight of ideas.  History of not sleeping for a few days.  Differential diagnosis includes manic episode as part of underlying bipolar disorder, possibly substance induced or medically induced.  No sign of delirium evident to suggest medical problems.  Labs largely unremarkable except for elevated blood sugar.  No sign of any known substance abuse.  I suggested to the patient that we discontinue her mirtazapine and start her on lithium which I think would be the most efficient thing to get the symptoms under control.  Patient refuses out of hand.  I am going to discontinue the mirtazapine and go ahead and order lithium 600 mg at night in case nursing can convince her to order it.  As she is on chronic Valium 5 twice a day there is no reason to discontinue that.  Continue 15-minute checks.  As needed medicines available for agitation.  Observation Level/Precautions:  15 minute checks  Laboratory:  Chemistry Profile  Psychotherapy:    Medications:    Consultations:    Discharge Concerns:    Estimated LOS:  Other:     Physician Treatment Plan for  Primary Diagnosis: Bipolar I disorder, most recent episode (or current) manic (HCC) Long Term Goal(s): Improvement in symptoms so as ready for discharge  Short Term Goals: Ability to demonstrate self-control will improve  Physician Treatment Plan for Secondary Diagnosis: Principal Problem:   Bipolar I disorder, most recent episode (or current) manic (HCC)  Long Term Goal(s): Improvement in symptoms so as ready for discharge  Short Term Goals: Compliance with prescribed medications will improve  I certify that inpatient services furnished can reasonably be expected to improve the patient's condition.    Mordecai Rasmussen, MD 3/30/20236:34 PM

## 2021-10-31 NOTE — ED Notes (Signed)
Pt states she would like her afternoon meds- offered pt PRN ativan but pt did not want it- discussed with Celso Amy who reordered pt's abilify but pt did not want that either- asked pt what she was referring to and pt states she only takes remeron and valium ?

## 2021-10-31 NOTE — ED Notes (Signed)
Called dietary for missing breakfast tray ?

## 2021-10-31 NOTE — ED Notes (Signed)
Pt given breakfast tray

## 2021-11-01 DIAGNOSIS — F3110 Bipolar disorder, current episode manic without psychotic features, unspecified: Secondary | ICD-10-CM | POA: Diagnosis not present

## 2021-11-01 MED ORDER — OLANZAPINE 10 MG IM SOLR: 10.0000 mg | Freq: Every evening | INTRAMUSCULAR | Status: DC | PRN

## 2021-11-01 NOTE — Progress Notes (Signed)
Recreation Therapy Notes ? ?INPATIENT RECREATION THERAPY ASSESSMENT ? ?Patient Details ?Name: Frances Sanders ?MRN: [id number] ?DOB: 01/10/1981 ?Today's Date: 11/01/2021 ?      ?Information Obtained From: ?Patient (Patient unable at this time) ? ?Able to Participate in Assessment/Interview: ?  ? ?Patient Presentation: ?  ? ?Reason for Admission (Per Patient): ?  ? ?Patient Stressors: ?  ? ?Coping Skills:   ?  ? ?Leisure Interests (2+):  ?  ? ?Frequency of Recreation/Participation: ?  ? ?Awareness of Community Resources:  ?  ? ?Community Resources:  ?  ? ?Current Use: ?  ? ?If no, Barriers?: ?  ? ?Expressed Interest in [employer] Information: ?  ? ?[place] of Residence:  ?  ? ?Patient Main Form of Transportation: ?  ? ?Patient Strengths:  ?  ? ?Patient Identified Areas of Improvement:  ?  ? ?Patient Goal for Hospitalization:  ?  ? ?Current SI (including self-harm):  ?  ? ?Current HI:  ?  ? ?Current AVH: ?  ? ?Staff Intervention Plan: ?  ? ?Consent to Intern Participation: ?  ? ?Aarin Sparkman ?11/01/2021, 3:52 PM ?

## 2021-11-01 NOTE — Plan of Care (Signed)
?  Problem: Education: ?Goal: Knowledge of [facility] Education information/materials will improve ?Outcome: Not Progressing ?Goal: Mental status will improve ?Outcome: Not Progressing ?Goal: Verbalization of understanding the information provided will improve ?Outcome: Not Progressing ?  ?Problem: Activity: ?Goal: Interest or engagement in activities will improve ?Outcome: Not Progressing ?Goal: Sleeping patterns will improve ?Outcome: Not Progressing ?  ?Problem: Health Behavior/Discharge Planning: ?Goal: Compliance with treatment plan for underlying cause of condition will improve ?Outcome: Not Progressing ?  ?Problem: Physical Regulation: ?Goal: Ability to maintain clinical measurements within normal limits will improve ?Outcome: Not Progressing ?  ?Problem: Safety: ?Goal: Periods of time without injury will increase ?Outcome: Progressing ?  ?Problem: Activity: ?Goal: Interest or engagement in leisure activities will improve ?Outcome: Not Progressing ?Goal: Imbalance in normal sleep/wake cycle will improve ?Outcome: Not Progressing ?  ?Problem: Activity: ?Goal: Will verbalize the importance of balancing activity with adequate rest periods ?Outcome: Not Progressing ?  ?Problem: Education: ?Goal: Will be free of psychotic symptoms ?Outcome: Not Progressing ?Goal: Knowledge of the prescribed therapeutic regimen will improve ?Outcome: Not Progressing ?  ?Problem: Safety: ?Goal: Ability to redirect hostility and anger into socially appropriate behaviors will improve ?Outcome: Not Progressing ?  ?

## 2021-11-01 NOTE — Progress Notes (Signed)
[facility] Second Physician Opinion Progress Note for Medication Administration to Non-consenting Patients (For Involuntarily Committed Patients) ? ?Patient: Frances Sanders ?Date of Birth: [phone] ?MRN: [id number] ? ?Reason for the Medication: The patient, without the benefit of the specific treatment measure, is incapable of participating in any available treatment plan that will give the patient a realistic opportunity of improving the patient's condition. ? ?Consideration of Side Effects: Consideration of the side effects related to the medication plan has been given. ? ?Rationale for Medication Administration: Patient is refusing medication. Her insight is extremely poor. She will continue to deteriorate without medication. This puts herself at risk for bodily harm. ? ? ? ?Sarina Ill, DO ?11/01/21  12:23 PM ? ? ?This documentation is good for (7) seven days from the date of the MD signature. New documentation must be completed every seven (7) days with detailed justification in the medical record if the patient requires continued non-emergent administration of psychotropic medications. ?

## 2021-11-01 NOTE — Progress Notes (Signed)
[facility] MD Progress Note ? ?11/01/2021 2:22 PM ?[place]  ?MRN:  [id number] ?Subjective: Frances Sanders seen for follow-up and she also attended treatment team meeting.  Frances Sanders continues to be hyperverbal hyperactive with euphoric and labile mood.  Paranoid.  Laughing a lot.  During treatment team became pretty disorganized and grandiose.  I spoke with her mother today as well who gave me a little background that the Frances Sanders recently broke up with her husband and that their relationship had been tumultuous for a long time.  Here on the unit Frances Sanders has been irregular about taking her medicine.  She is even refused her Valium at times possibly because she does not recognize the generic pill.  Physically seems to be stable.  Vitals are unremarkable ?Principal Problem: Bipolar I disorder, most recent episode (or current) manic ([place]) ?Diagnosis: Principal Problem: ?  Bipolar I disorder, most recent episode (or current) manic ([place]) ? ?Total Time spent with Frances Sanders: 30 minutes ? ?Past Psychiatric History: Past history of episodes of severe depression and psychosis widely scattered usually under stress. ? ?Past Medical History:  ?Past Medical History:  ?Diagnosis Date  ? Anxiety   ? Cough 06/07/2020  ? Depression   ? Diabetes mellitus without complication ([place])   ? Gestational diabetes   ? Insomnia   ? Paranoid schizophrenia ([place])   ? Sinus drainage 11/01/2020  ?  ?Past Surgical History:  ?Procedure Laterality Date  ? APPENDECTOMY    ? BIOPSY N/A 01/24/2021  ? Procedure: BIOPSY;  Surgeon: Lin Landsman, MD;  Location: [place];  Service: Endoscopy;  Laterality: N/A;  ? COLONOSCOPY WITH PROPOFOL N/A 01/24/2021  ? Procedure: COLONOSCOPY WITH PROPOFOL;  Surgeon: Lin Landsman, MD;  Location: [place];  Service: Endoscopy;  Laterality: N/A;  ? HERNIA REPAIR    ? POLYPECTOMY N/A 01/24/2021  ? Procedure: POLYPECTOMY;  Surgeon: Lin Landsman, MD;  Location: [place];  Service:  Endoscopy;  Laterality: N/A;  ? TUBAL LIGATION Bilateral 07/03/2016  ? Procedure: POST PARTUM TUBAL LIGATION BY SALPINGECTOMY;  Surgeon: Gae Dry, MD;  Location: [facility];  Service: Gynecology;  Laterality: Bilateral;  ? ?Family History:  ?Family History  ?Problem Relation Age of Onset  ? Diabetes Paternal Aunt   ? Diabetes Paternal Uncle   ? Diabetes Maternal Grandmother   ? Heart disease Maternal Grandmother   ? Hypertension Maternal Grandmother   ? Colon cancer Maternal Grandmother   ? Diabetes Maternal Grandfather   ? Ovarian cancer Cousin 22  ?     ? accurate family history  ? Uterine cancer Cousin   ? ?Family Psychiatric  History: See previous ?Social History:  ?Social History  ? ?Substance and Sexual Activity  ?Alcohol Use No  ?   ?Social History  ? ?Substance and Sexual Activity  ?Drug Use No  ?  ?Social History  ? ?Socioeconomic History  ? Marital status: Married  ?  Spouse name: Not on file  ? Number of children: Not on file  ? Years of education: Not on file  ? Highest education level: Not on file  ?Occupational History  ? Not on file  ?Tobacco Use  ? Smoking status: Every Day  ?  Packs/day: 0.50  ?  Types: Cigarettes  ? Smokeless tobacco: Never  ?Vaping Use  ? Vaping Use: Never used  ?Substance and Sexual Activity  ? Alcohol use: No  ? Drug use: No  ? Sexual activity: Yes  ?  Birth control/protection: Injection  ?  Other Topics Concern  ? Not on file  ?Social History Narrative  ? Not on file  ? ?Social Determinants of Health  ? ?Financial Resource Strain: Not on file  ?Food Insecurity: Not on file  ?Transportation Needs: Not on file  ?Physical Activity: Not on file  ?Stress: Not on file  ?Social Connections: Not on file  ? ?Additional Social History:  ?  ?  ?  ?  ?  ?  ?  ?  ?  ?  ?  ? ?Sleep: Poor ? ?Appetite:  Poor ? ?Current Medications: ?Current Facility-Administered Medications  ?Medication Dose Route Frequency Provider Last Rate Last Admin  ? acetaminophen (TYLENOL) tablet 650 mg  650 mg Oral  Q6H PRN Patrecia Pour, NP      ? alum & mag hydroxide-simeth (MAALOX/MYLANTA) 200-200-20 MG/5ML suspension 30 mL  30 mL Oral Q4H PRN Patrecia Pour, NP   30 mL at 10/31/21 2337  ? ARIPiprazole (ABILIFY) tablet 5 mg  5 mg Oral Daily Patrecia Pour, NP      ? diazepam (VALIUM) tablet 5 mg  5 mg Oral BID Donnika Kucher, Madie Reno, MD   5 mg at 10/31/21 2008  ? diphenhydrAMINE (BENADRYL) capsule 50 mg  50 mg Oral Q6H PRN Keelynn Furgerson, Madie Reno, MD   50 mg at 10/31/21 2139  ? Or  ? diphenhydrAMINE (BENADRYL) injection 50 mg  50 mg Intramuscular Q6H PRN Breeze Angell T, MD      ? haloperidol (HALDOL) tablet 5 mg  5 mg Oral Q6H PRN Polette Nofsinger T, MD      ? Or  ? haloperidol lactate (HALDOL) injection 5 mg  5 mg Intramuscular Q6H PRN Raea Magallon T, MD      ? lithium carbonate (LITHOBID) CR tablet 600 mg  600 mg Oral QHS Valeriano Bain T, MD      ? LORazepam (ATIVAN) tablet 0.5 mg  0.5 mg Oral Daily PRN Patrecia Pour, NP   0.5 mg at 10/31/21 2139  ? magnesium hydroxide (MILK OF MAGNESIA) suspension 30 mL  30 mL Oral Daily PRN Patrecia Pour, NP   30 mL at 11/01/21 0750  ? metFORMIN (GLUCOPHAGE) tablet 500 mg  500 mg Oral BID WC Patrecia Pour, NP      ? nicotine polacrilex (NICORETTE) gum 2 mg  2 mg Oral PRN Bronda Alfred, Madie Reno, MD   2 mg at 10/31/21 2008  ? ? ?Lab Results:  ?Results for orders placed or performed during the hospital encounter of 10/30/21 (from the past 48 hour(s))  ?Comprehensive metabolic panel     Status: Abnormal  ? Collection Time: 10/30/21  9:54 PM  ?Result Value Ref Range  ? Sodium 132 (L) 135 - 145 mmol/L  ? Potassium 3.5 3.5 - 5.1 mmol/L  ? Chloride 99 98 - 111 mmol/L  ? CO2 20 (L) 22 - 32 mmol/L  ? Glucose, Bld 172 (H) 70 - 99 mg/dL  ?  Comment: Glucose reference range applies only to samples taken after fasting for at least 8 hours.  ? BUN 5 (L) 6 - 20 mg/dL  ? Creatinine, Ser 0.78 0.44 - 1.00 mg/dL  ? Calcium 9.3 8.9 - 10.3 mg/dL  ? Total Protein 8.2 (H) 6.5 - 8.1 g/dL  ? Albumin 4.9 3.5 - 5.0 g/dL  ? AST  26 15 - 41 U/L  ? ALT 11 0 - 44 U/L  ? Alkaline Phosphatase 67 38 - 126 U/L  ? Total Bilirubin  0.6 0.3 - 1.2 mg/dL  ? GFR, Estimated >60 >60 mL/min  ?  Comment: (NOTE) ?Calculated using the CKD-EPI Creatinine Equation (2021) ?  ? Anion gap 13 5 - 15  ?  Comment: Performed at [facility], [address], [place] [address]  ?Ethanol     Status: None  ? Collection Time: 10/30/21  9:54 PM  ?Result Value Ref Range  ? Alcohol, Ethyl (B) <10 <10 mg/dL  ?  Comment: (NOTE) ?Lowest detectable limit for serum alcohol is 10 mg/dL. ? ?For medical purposes only. ?Performed at [facility], [place], ?[place] [address] ?  ?Salicylate level     Status: Abnormal  ? Collection Time: 10/30/21  9:54 PM  ?Result Value Ref Range  ? Salicylate Lvl [id number] (L) 7.0 - 30.0 mg/dL  ?  Comment: Performed at [facility], [address] [place], [place] [address]  ?Acetaminophen level     Status: Abnormal  ? Collection Time: 10/30/21  9:54 PM  ?Result Value Ref Range  ? Acetaminophen (Tylenol), Serum <10 (L) 10 - 30 ug/mL  ?  Comment: (NOTE) ?Therapeutic concentrations vary significantly. A range of 10-30 ug/mL  ?may be an effective concentration for many patients. However, some  ?are best treated at concentrations outside of this range. ?Acetaminophen concentrations >150 ug/mL at 4 hours after ingestion  ?and >50 ug/mL at 12 hours after ingestion are often associated with  ?toxic reactions. ? ?Performed at [facility], [place], ?[place] [address] ?  ?cbc     Status: Abnormal  ? Collection Time: 10/30/21  9:54 PM  ?Result Value Ref Range  ? WBC 10.9 (H) 4.0 - 10.5 K/uL  ? RBC 4.53 3.87 - 5.11 MIL/uL  ? Hemoglobin 15.2 (H) 12.0 - 15.0 g/dL  ? HCT 45.2 36.0 - 46.0 %  ? MCV 99.8 80.0 - 100.0 fL  ? MCH 33.6 26.0 - 34.0 pg  ? MCHC 33.6 30.0 - 36.0 g/dL  ? RDW 12.8 11.5 - 15.5 %  ? Platelets 331 150 - 400 K/uL  ? nRBC 0.0 0.0 - 0.2 %  ?  Comment: Performed at [facility], [address] [place],  [address]  ?Urine Drug Screen, Qualitative     Status: Abnormal  ? Collection Time: 10/30/21  9:54 PM  ?Result Value Ref Range  ? Tricyclic, Ur Screen NONE DETEC

## 2021-11-01 NOTE — Progress Notes (Signed)
At the time of assessment patient denies pain, depression, SI/HI/AVH. Patient endorsed anxiety but refused anxiety medication. Patient refused all scheduled medications during this shift. Patient made multiple attempts to enter the nurses station and leave the unit. Patient frequently yelling around the unit. Patient has not eaten any meals provided throughout the day. ?MD ordered 1:1 for patient due to behaviors on the unit.  ?Q15 minute safety checks maintained throughout shift. Patient remains safe on the unit at this time. ?

## 2021-11-01 NOTE — Progress Notes (Signed)
Recreation Therapy Notes ? ?INPATIENT RECREATION TR PLAN ? ?Patient Details ?Name: Frances Sanders ?MRN: [id number] ?DOB: 1981-01-21 ?Today's Date: 11/01/2021 ? ?Rec Therapy Plan ?Is patient appropriate for Therapeutic Recreation?: Yes ?Treatment times per week: at least 3 ?Estimated Length of Stay: 5-7 days ?TR Treatment/Interventions: Group participation (Comment) ? ?Discharge Criteria ?Pt will be discharged from therapy if:: Discharged ? ?Discharge Summary ?  ? ? ?Jahlia Omura ?11/01/2021, 3:53 PM ?

## 2021-11-01 NOTE — BH IP Treatment Plan (Addendum)
Interdisciplinary Treatment and Diagnostic Plan Update ? ?11/01/2021 ?Time of Session: 9:30AM ?[place] ?MRN: [id number] ? ?Principal Diagnosis: Bipolar I disorder, most recent episode (or current) manic ([place]) ? ?Secondary Diagnoses: Principal Problem: ?  Bipolar I disorder, most recent episode (or current) manic ([place]) ? ? ?Current Medications:  ?Current Facility-Administered Medications  ?Medication Dose Route Frequency Provider Last Rate Last Admin  ? acetaminophen (TYLENOL) tablet 650 mg  650 mg Oral Q6H PRN Patrecia Pour, NP      ? alum & mag hydroxide-simeth (MAALOX/MYLANTA) 200-200-20 MG/5ML suspension 30 mL  30 mL Oral Q4H PRN Patrecia Pour, NP   30 mL at 10/31/21 2337  ? ARIPiprazole (ABILIFY) tablet 5 mg  5 mg Oral Daily Patrecia Pour, NP      ? diazepam (VALIUM) tablet 5 mg  5 mg Oral BID Clapacs, Madie Reno, MD   5 mg at 10/31/21 2008  ? diphenhydrAMINE (BENADRYL) capsule 50 mg  50 mg Oral Q6H PRN Clapacs, Madie Reno, MD   50 mg at 10/31/21 2139  ? Or  ? diphenhydrAMINE (BENADRYL) injection 50 mg  50 mg Intramuscular Q6H PRN Clapacs, John T, MD      ? haloperidol (HALDOL) tablet 5 mg  5 mg Oral Q6H PRN Clapacs, John T, MD      ? Or  ? haloperidol lactate (HALDOL) injection 5 mg  5 mg Intramuscular Q6H PRN Clapacs, John T, MD      ? lithium carbonate (LITHOBID) CR tablet 600 mg  600 mg Oral QHS Clapacs, John T, MD      ? LORazepam (ATIVAN) tablet 0.5 mg  0.5 mg Oral Daily PRN Patrecia Pour, NP   0.5 mg at 10/31/21 2139  ? magnesium hydroxide (MILK OF MAGNESIA) suspension 30 mL  30 mL Oral Daily PRN Patrecia Pour, NP   30 mL at 11/01/21 0750  ? metFORMIN (GLUCOPHAGE) tablet 500 mg  500 mg Oral BID WC Patrecia Pour, NP      ? nicotine polacrilex (NICORETTE) gum 2 mg  2 mg Oral PRN Clapacs, Madie Reno, MD   2 mg at 10/31/21 2008  ? ?PTA Medications: ?Medications Prior to Admission  ?Medication Sig Dispense Refill Last Dose  ? ARIPiprazole (ABILIFY) 5 MG tablet Take 5 mg by mouth daily. (Patient  not taking: Reported on 10/30/2021)     ? diazepam (VALIUM) 5 MG tablet Take 5 mg by mouth every 6 (six) hours as needed for anxiety.     ? metFORMIN (GLUCOPHAGE) 500 MG tablet 500 mg 2 (two) times daily. (Patient not taking: Reported on 10/30/2021)     ? mirtazapine (REMERON) 15 MG tablet Take 15 mg by mouth at bedtime.  2   ? omeprazole (PRILOSEC) 20 MG capsule TAKE 1 CAPSULE BY MOUTH DAILY 30 capsule 11   ? ? ?Patient Stressors: Marital or family conflict   ? ?Patient Strengths: Supportive family/friends  ? ?Treatment Modalities: Medication Management, Group therapy, Case management,  ?1 to 1 session with clinician, Psychoeducation, Recreational therapy. ? ? ?Physician Treatment Plan for Primary Diagnosis: Bipolar I disorder, most recent episode (or current) manic ([place]) ?Long Term Goal(s): Improvement in symptoms so as ready for discharge  ? ?Short Term Goals: Compliance with prescribed medications will improve ?Ability to demonstrate self-control will improve ? ?Medication Management: Evaluate patient's response, side effects, and tolerance of medication regimen. ? ?Therapeutic Interventions: 1 to 1 sessions, Unit Group sessions and Medication administration. ? ?Evaluation of Outcomes: Not  Met ? ?Physician Treatment Plan for Secondary Diagnosis: Principal Problem: ?  Bipolar I disorder, most recent episode (or current) manic ([place]) ? ?Long Term Goal(s): Improvement in symptoms so as ready for discharge  ? ?Short Term Goals: Compliance with prescribed medications will improve ?Ability to demonstrate self-control will improve    ? ?Medication Management: Evaluate patient's response, side effects, and tolerance of medication regimen. ? ?Therapeutic Interventions: 1 to 1 sessions, Unit Group sessions and Medication administration. ? ?Evaluation of Outcomes: Not Met ? ? ?RN Treatment Plan for Primary Diagnosis: Bipolar I disorder, most recent episode (or current) manic ([place]) ?Long Term Goal(s): Knowledge of disease and  therapeutic regimen to maintain health will improve ? ?Short Term Goals: Ability to remain free from injury will improve, Ability to demonstrate self-control, Ability to participate in decision making will improve, Ability to verbalize feelings will improve, Ability to disclose and discuss suicidal ideas, Ability to identify and develop effective coping behaviors will improve, and Compliance with prescribed medications will improve ? ?Medication Management: RN will administer medications as ordered by provider, will assess and evaluate patient's response and provide education to patient for prescribed medication. RN will report any adverse and/or side effects to prescribing provider. ? ?Therapeutic Interventions: 1 on 1 counseling sessions, Psychoeducation, Medication administration, Evaluate responses to treatment, Monitor vital signs and CBGs as ordered, Perform/monitor CIWA, COWS, AIMS and Fall Risk screenings as ordered, Perform wound care treatments as ordered. ? ?Evaluation of Outcomes: Not Met ? ? ?LCSW Treatment Plan for Primary Diagnosis: Bipolar I disorder, most recent episode (or current) manic ([place]) ?Long Term Goal(s): Safe transition to appropriate next level of care at discharge, Engage patient in therapeutic group addressing interpersonal concerns. ? ?Short Term Goals: Engage patient in aftercare planning with referrals and resources, Increase social support, Increase ability to appropriately verbalize feelings, Increase emotional regulation, Facilitate acceptance of mental health diagnosis and concerns, and Increase skills for wellness and recovery ? ?Therapeutic Interventions: Assess for all discharge needs, 1 to 1 time with Education officer, museum, Explore available resources and support systems, Assess for adequacy in community support network, Educate family and significant other(s) on suicide prevention, Complete Psychosocial Assessment, Interpersonal group therapy. ? ?Evaluation of Outcomes: Not  Met ? ? ?Progress in Treatment: ?Attending groups: No. ?Participating in groups: No. ?Taking medication as prescribed: No. ?Toleration medication: No. ?Family/Significant other contact made: No, will contact:  once permission is given. ?Patient understands diagnosis: Yes. ?Discussing patient identified problems/goals with staff: Yes. ?Medical problems stabilized or resolved: Yes. ?Denies suicidal/homicidal ideation: Yes. ?Issues/concerns per patient self-inventory: No. ?Other: none ? ?New problem(s) identified: No, Describe:  none ? ?New Short Term/Long Term Goal(s): detox, elimination of symptoms of psychosis, medication management for mood stabilization; elimination of SI thoughts; development of comprehensive mental wellness plan.  ? ?Patient Goals:  "Number one is God.  Number two is people.  Number three is the body.  Cause we are the world. Help the children be safe." ? ?Discharge Plan or Barriers: CSW to assist the patient in development of appropriate aftercare plans. At this time the patient appears to be hypomanic.  ? ?Reason for Continuation of Hospitalization: Anxiety ?Depression ?Medication stabilization ? ?Estimated Length of Stay:  1-7 days ? ? ?Scribe for Treatment Team: ?Rozann Lesches, LCSW ?11/01/2021 ?9:59 AM ?

## 2021-11-01 NOTE — Group Note (Signed)
[facility] LCSW Group Therapy Note ? ? ?Group Date: 11/01/2021 ?Start Time: 1300 ?End Time: 1400 ? ?Type of Therapy and Topic:  Group Therapy:  Feelings around Relapse and Recovery ? ?Participation Level:  Did Not Attend  ? ?Mood: ? ?Description of Group:   ? Patients in this group will discuss emotions they experience before and after a relapse. They will process how experiencing these feelings, or avoidance of experiencing them, relates to having a relapse. Facilitator will guide patients to explore emotions they have related to recovery. Patients will be encouraged to process which emotions are more powerful. They will be guided to discuss the emotional reaction significant others in their lives may have to patients? relapse or recovery. Patients will be assisted in exploring ways to respond to the emotions of others without this contributing to a relapse. ? ?Therapeutic Goals: ?Patient will identify two or more emotions that lead to relapse for them:  ?Patient will identify two emotions that result when they relapse:  ?Patient will identify two emotions related to recovery:  ?Patient will demonstrate ability to communicate their needs through discussion and/or role plays. ? ? ?Summary of Patient Progress: ? ?Patient not appropriate for group setting, currently on 1:1 on back hall due to elopement risk and intrusive behaviors.  ? ? ?Therapeutic Modalities:   ?Cognitive Behavioral Therapy ?Solution-Focused Therapy ?Assertiveness Training ?Relapse Prevention Therapy ? ? ?Corky Crafts, LCSWA ?

## 2021-11-01 NOTE — Progress Notes (Signed)
Recreation Therapy Notes ? ? ?Date: 11/01/2021 ? ?Time: 11:00am    ? ?Location: Craft room  ? ?Behavioral response: N/A ?  ?Intervention Topic: Strengths  ? ?Discussion/Intervention: ?Patient refused to attend group.  ? ?Clinical Observations/Feedback:  ?Patient refused to attend group.  ?  ?[name] LRT/CTRS ? ? ? ? ? ? ? ?Frances Sanders ?11/01/2021 12:04 PM ?

## 2021-11-02 MED ORDER — OLANZAPINE 5 MG PO TABS
5.0000 mg | ORAL_TABLET | Freq: Every day | ORAL | Status: DC
  Administered 2021-11-02: 5 mg via ORAL
  Filled 2021-11-02 (×2): qty 1
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
## 2021-11-02 NOTE — Plan of Care (Signed)
?  Problem: Education: ?Goal: Knowledge of [facility] information/materials will improve ?Outcome: Progressing ?Goal: Emotional status will improve ?Outcome: Progressing ?Goal: Mental status will improve ?Outcome: Progressing ?Goal: Verbalization of understanding the information provided will improve ?Outcome: Progressing ?  ?Problem: Activity: ?Goal: Interest or engagement in activities will improve ?Outcome: Progressing ?Goal: Sleeping patterns will improve ?Outcome: Progressing ?  ?Problem: Coping: ?Goal: Ability to verbalize frustrations and anger appropriately will improve ?Outcome: Progressing ?Goal: Ability to demonstrate self-control will improve ?Outcome: Progressing ?  ?Problem: Health Behavior/Discharge Planning: ?Goal: Identification of resources available to assist in meeting health care needs will improve ?Outcome: Progressing ?Goal: Compliance with treatment plan for underlying cause of condition will improve ?Outcome: Progressing ?  ?Problem: Physical Regulation: ?Goal: Ability to maintain clinical measurements within normal limits will improve ?Outcome: Progressing ?  ?Problem: Safety: ?Goal: Periods of time without injury will increase ?Outcome: Progressing ?  ?Problem: Education: ?Goal: Utilization of techniques to improve thought processes will improve ?Outcome: Progressing ?Goal: Knowledge of the prescribed therapeutic regimen will improve ?Outcome: Progressing ?  ?Problem: Activity: ?Goal: Interest or engagement in leisure activities will improve ?Outcome: Progressing ?Goal: Imbalance in normal sleep/wake cycle will improve ?Outcome: Progressing ?  ?Problem: Activity: ?Goal: Will verbalize the importance of balancing activity with adequate rest periods ?Outcome: Progressing ?  ?Problem: Education: ?Goal: Will be free of psychotic symptoms ?Outcome: Progressing ?Goal: Knowledge of the prescribed therapeutic regimen will improve ?Outcome: Progressing ?  ?Problem: Safety: ?Goal:  Ability to redirect hostility and anger into socially appropriate behaviors will improve ?Outcome: Progressing ?Goal: Ability to remain free from injury will improve ?Outcome: Progressing ?  ?Problem: Self-Care: ?Goal: Ability to participate in self-care as condition permits will improve ?Outcome: Progressing ?  ?

## 2021-11-02 NOTE — Group Note (Signed)
LCSW Group Therapy Note ? ?Group Date: 11/02/2021 ?Start Time: 1300 ?End Time: 1400 ? ? ?Type of Therapy and Topic:  Group Therapy - Healthy vs Unhealthy Coping Skills ? ?Participation Level:  Active  ? ?Description of Group ?The focus of this group was to determine what unhealthy coping techniques typically are used by group members and what healthy coping techniques would be helpful in coping with various problems. Patients were guided in becoming aware of the differences between healthy and unhealthy coping techniques. Patients were asked to identify 2-3 healthy coping skills they would like to learn to use more effectively. ? ?Therapeutic Goals ?Patients learned that coping is what human beings do all day long to deal with various situations in their lives ?Patients defined and discussed healthy vs unhealthy coping techniques ?Patients identified their preferred coping techniques and identified whether these were healthy or unhealthy ?Patients determined 2-3 healthy coping skills they would like to become more familiar with and use more often. ?Patients provided support and ideas to each other ? ? ?Summary of Patient Progress: Patient was present for the entirety of the group session. Patient was an active listener and participated in the topic of discussion and added nuance to topic of conversation. Patient shared how she "recently left a bad situation" but did not disclose details. Patient identified smoking cigarettes as an unhealthy coping skill she has been actively taking steps to quit. Patient identified reading and listening to music as healthy coping skills. Patient shared how the program [facility] has had a positive impact in her life and expressed motivation to continue to be active in the program and in her church. ? ? ? ?Therapeutic Modalities ?Cognitive Behavioral Therapy ?Motivational Interviewing ? ?Ileana Ladd Akhiok, LCSWA ?11/02/2021  4:38 PM   ?

## 2021-11-02 NOTE — Progress Notes (Signed)
1:1 observation ?1900-2300 ?Patient is asleep. No acute distress. Sitter with patient ?0000-0400 ?Patient is asleep. Sitter with patient. No acute distress. ?0400-0700 ?Patient is asleep. Sitter with patient. No acute distress  ?

## 2021-11-02 NOTE — Plan of Care (Signed)
?  Problem: Education: ?Goal: Knowledge of [facility] Education information/materials will improve ?Outcome: Progressing ?Goal: Emotional status will improve ?Outcome: Progressing ?Goal: Mental status will improve ?Outcome: Progressing ?Goal: Verbalization of understanding the information provided will improve ?Outcome: Progressing ?  ?Problem: Activity: ?Goal: Interest or engagement in activities will improve ?Outcome: Progressing ?Goal: Sleeping patterns will improve ?Outcome: Progressing ?  ?Problem: Coping: ?Goal: Ability to verbalize frustrations and anger appropriately will improve ?Outcome: Progressing ?Goal: Ability to demonstrate self-control will improve ?Outcome: Progressing ?  ?Problem: Health Behavior/Discharge Planning: ?Goal: Identification of resources available to assist in meeting health care needs will improve ?Outcome: Progressing ?Goal: Compliance with treatment plan for underlying cause of condition will improve ?Outcome: Progressing ?  ?Problem: Physical Regulation: ?Goal: Ability to maintain clinical measurements within normal limits will improve ?Outcome: Progressing ?  ?Problem: Safety: ?Goal: Periods of time without injury will increase ?Outcome: Progressing ?  ?Problem: Education: ?Goal: Utilization of techniques to improve thought processes will improve ?Outcome: Progressing ?Goal: Knowledge of the prescribed therapeutic regimen will improve ?Outcome: Progressing ?  ?Problem: Activity: ?Goal: Interest or engagement in leisure activities will improve ?Outcome: Progressing ?Goal: Imbalance in normal sleep/wake cycle will improve ?Outcome: Progressing ?  ?Problem: Activity: ?Goal: Will verbalize the importance of balancing activity with adequate rest periods ?Outcome: Progressing ?  ?Problem: Education: ?Goal: Will be free of psychotic symptoms ?Outcome: Progressing ?Goal: Knowledge of the prescribed therapeutic regimen will improve ?Outcome: Progressing ?  ?

## 2021-11-02 NOTE — Progress Notes (Signed)
Patient slept for 12 hours last night. ?

## 2021-11-02 NOTE — Progress Notes (Signed)
2200 Patient was asleep when it was time to get her Lithium/forced Zyprexa, and since she had not slept in quite some time, she was allowed to sleep. ?
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
## 2021-11-02 NOTE — Progress Notes (Incomplete)
[facility] MD Progress Note ? ?11/02/2021 9:47 AM ?Burnell Blanks Heitman  ?MRN:  [id number] ?Subjective:  Patient refused all scheduled medications during this shift. Patient made multiple attempts to enter the nurses station and leave the unit. Patient frequently yelling around the unit. Patient has not eaten any meals provided throughout the day. ?MD ordered 1:1 for patient due to behaviors on the unit.  ?Principal Problem: Bipolar I disorder, most recent episode (or current) manic ([place]) ?Diagnosis: Principal Problem: ?  Bipolar I disorder, most recent episode (or current) manic ([place]) ? ?Total Time spent with patient: {Time; 15 min - 8 hours:17441} ? ?Past Psychiatric History: *** ? ?Past Medical History:  ?Past Medical History:  ?Diagnosis Date  ?? Anxiety   ?? Cough 06/07/2020  ?? Depression   ?? Diabetes mellitus without complication ([place])   ?? Gestational diabetes   ?? Insomnia   ?? Paranoid schizophrenia ([place])   ?? Sinus drainage 11/01/2020  ?  ?Past Surgical History:  ?Procedure Laterality Date  ?? APPENDECTOMY    ?? BIOPSY N/A 01/24/2021  ? Procedure: BIOPSY;  Surgeon: Lin Landsman, MD;  Location: [place];  Service: Endoscopy;  Laterality: N/A;  ?? COLONOSCOPY WITH PROPOFOL N/A 01/24/2021  ? Procedure: COLONOSCOPY WITH PROPOFOL;  Surgeon: Lin Landsman, MD;  Location: [place];  Service: Endoscopy;  Laterality: N/A;  ?? HERNIA REPAIR    ?? POLYPECTOMY N/A 01/24/2021  ? Procedure: POLYPECTOMY;  Surgeon: Lin Landsman, MD;  Location: [place];  Service: Endoscopy;  Laterality: N/A;  ?? TUBAL LIGATION Bilateral 07/03/2016  ? Procedure: POST PARTUM TUBAL LIGATION BY SALPINGECTOMY;  Surgeon: Gae Dry, MD;  Location: [facility];  Service: Gynecology;  Laterality: Bilateral;  ? ?Family History:  ?Family History  ?Problem Relation Age of Onset  ?? Diabetes Paternal Aunt   ?? Diabetes Paternal Uncle   ?? Diabetes Maternal Grandmother   ?? Heart disease Maternal Grandmother    ?? Hypertension Maternal Grandmother   ?? Colon cancer Maternal Grandmother   ?? Diabetes Maternal Grandfather   ?? Ovarian cancer Cousin 53  ?     ? accurate family history  ?? Uterine cancer Cousin   ? ?Family Psychiatric  History: *** ?Social History:  ?Social History  ? ?Substance and Sexual Activity  ?Alcohol Use No  ?   ?Social History  ? ?Substance and Sexual Activity  ?Drug Use No  ?  ?Social History  ? ?Socioeconomic History  ?? Marital status: Married  ?  Spouse name: Not on file  ?? Number of children: Not on file  ?? Years of education: Not on file  ?? Highest education level: Not on file  ?Occupational History  ?? Not on file  ?Tobacco Use  ?? Smoking status: Every Day  ?  Packs/day: 0.50  ?  Types: Cigarettes  ?? Smokeless tobacco: Never  ?Vaping Use  ?? Vaping Use: Never used  ?Substance and Sexual Activity  ?? Alcohol use: No  ?? Drug use: No  ?? Sexual activity: Yes  ?  Birth control/protection: Injection  ?Other Topics Concern  ?? Not on file  ?Social History Narrative  ?? Not on file  ? ?Social Determinants of Health  ? ?Financial Resource Strain: Not on file  ?Food Insecurity: Not on file  ?Transportation Needs: Not on file  ?Physical Activity: Not on file  ?Stress: Not on file  ?Social Connections: Not on file  ? ?Additional Social History:  ?  ?  ?  ?  ?  ?  ?  ?  ?  ?  ?  ? ?  Sleep: {BHH GOOD/FAIR/POOR:22877} ? ?Appetite:  {BHH GOOD/FAIR/POOR:22877} ? ?Current Medications: ?Current Facility-Administered Medications  ?Medication Dose Route Frequency Provider Last Rate Last Admin  ?? acetaminophen (TYLENOL) tablet 650 mg  650 mg Oral Q6H PRN Patrecia Pour, NP      ?? alum & mag hydroxide-simeth (MAALOX/MYLANTA) 200-200-20 MG/5ML suspension 30 mL  30 mL Oral Q4H PRN Patrecia Pour, NP   30 mL at 11/02/21 0859  ?? diazepam (VALIUM) tablet 5 mg  5 mg Oral BID Clapacs, Madie Reno, MD   5 mg at 11/02/21 0859  ?? diphenhydrAMINE (BENADRYL) capsule 50 mg  50 mg Oral Q6H PRN Clapacs, Madie Reno, MD   50 mg  at 10/31/21 2139  ? Or  ?? diphenhydrAMINE (BENADRYL) injection 50 mg  50 mg Intramuscular Q6H PRN Clapacs, John T, MD      ?? haloperidol (HALDOL) tablet 5 mg  5 mg Oral Q6H PRN Clapacs, John T, MD      ? Or  ?? haloperidol lactate (HALDOL) injection 5 mg  5 mg Intramuscular Q6H PRN Clapacs, Madie Reno, MD      ?? lithium carbonate (LITHOBID) CR tablet 600 mg  600 mg Oral QHS Clapacs, Madie Reno, MD      ?? LORazepam (ATIVAN) tablet 0.5 mg  0.5 mg Oral Daily PRN Patrecia Pour, NP   0.5 mg at 10/31/21 2139  ?? magnesium hydroxide (MILK OF MAGNESIA) suspension 30 mL  30 mL Oral Daily PRN Patrecia Pour, NP   30 mL at 11/01/21 0750  ?? metFORMIN (GLUCOPHAGE) tablet 500 mg  500 mg Oral BID WC Patrecia Pour, NP   500 mg at 11/02/21 0859  ?? nicotine polacrilex (NICORETTE) gum 2 mg  2 mg Oral PRN Clapacs, Madie Reno, MD   2 mg at 10/31/21 2008  ?? OLANZapine (ZYPREXA) injection 10 mg  10 mg Intramuscular QHS PRN Clapacs, John T, MD      ? ? ?Lab Results: No results found for this or any previous visit (from the past 48 hour(s)). ? ?Blood Alcohol level:  ?Lab Results  ?Component Value Date  ? ETH <10 10/30/2021  ? ETH <5 09/06/2015  ? ? ?Metabolic Disorder Labs: ?Lab Results  ?Component Value Date  ? HGBA1C 5.5 09/07/2015  ? ?Lab Results  ?Component Value Date  ? PROLACTIN 14.9 09/07/2015  ? ?Lab Results  ?Component Value Date  ? CHOL 166 09/13/2019  ? TRIG 159 (H) 09/13/2019  ? HDL 47 09/13/2019  ? CHOLHDL 3.5 09/13/2019  ? VLDL 21 09/07/2015  ? [place] 91 09/13/2019  ? LDLCALC 102 (H) 09/07/2015  ? ? ?Physical Findings: ?AIMS:  , ,  ,  ,    ?CIWA:    ?COWS:    ? ?Musculoskeletal: ?Strength & Muscle Tone: {desc; muscle tone:32375} ?Gait & Station: {PE GAIT ED NATL:22525} ?Patient leans: {Patient Leans:21022755} ? ?Psychiatric Specialty Exam: ? ?Presentation  ?General Appearance: Bizarre ? ?Eye Contact:Fleeting ? ?Speech:Pressured ? ?Speech Volume:Increased ? ?Handedness:Right ? ? ?Mood and Affect  ?Mood:Irritable; Euphoric;  Anxious ? ?Affect:Inappropriate ? ? ?Thought Process  ?Thought Processes:Disorganized; Irrevelant ? ?Descriptions of Associations:Loose ? ?Orientation:Full (Time, Place and Person) ? ?Thought Content:Illogical; Paranoid Ideation; Rumination; Scattered; Tangential ? ?History of Schizophrenia/Schizoaffective disorder:No ? ?Duration of Psychotic Symptoms:Greater than six months ? ?Hallucinations:No data recorded ?Ideas of Reference:None ? ?Suicidal Thoughts:No data recorded ?Homicidal Thoughts:No data recorded ? ?Sensorium  ?Memory:Immediate Poor; Recent Poor; Remote Poor ? ?Judgment:Poor ? ?Insight:Poor ? ? ?Executive Functions  ?Concentration:Poor ? ?  Attention Span:Poor ? ?Recall:Poor ? ?Fund of Knowledge:Poor ? ?Language:Poor ? ? ?Psychomotor Activity  ?Psychomotor Activity:No data recorded ? ?Assets  ?Assets:Communication Skills; Financial Resources/Insurance; Resilience; Social Support ? ? ?Sleep  ?Sleep:No data recorded ? ? ?Physical Exam: ?Physical Exam ?ROS ?Blood pressure 117/63, pulse (!) 104, temperature 98.5 ?F (36.9 ?C), temperature source Oral, resp. rate 18, height 5' (1.524 m), weight 49 kg, SpO2 100 %, unknown if currently breastfeeding. Body mass index is 21.09 kg/m?. ? ? ?Treatment Plan Summary: ?{CHL [facility] MD [place]. [id number] ? ?Adriann Ballweg ?11/02/2021, 9:47 AM ? ?

## 2021-11-02 NOTE — Progress Notes (Signed)
[facility] MD Progress Note ? ?11/02/2021 10:22 AM ?Frances Sanders  ?MRN:  [id number] ?Subjective:  Patient refused all scheduled medications during this shift. Patient made multiple attempts to enter the nurses station and leave the unit. Patient frequently yelling around the unit. Patient has not eaten any meals provided throughout the day. MD ordered 1:1 for patient due to behaviors on the unit.  ?Patient did get some sleep last night and was not given her Lithium dose.  She presents today in a manic mood with flight of ideas, pressured speech, elevated and irritable mood and psychomotor agitation. She is grandiose and paranoid and is currently denying any SI/HI or any aVH. She is being taken off the 1:1 with promise to co-operate.  ?Principal Problem: Bipolar I disorder, most recent episode (or current) manic (HCC) ?Diagnosis: Principal Problem: ?  Bipolar I disorder, most recent episode (or current) manic (HCC) ? ?Total Time spent with patient: 30 minutes ? ?Past Psychiatric History: Past history of episodes of severe depression and psychosis widely scattered usually under stress ? ?Past Medical History:  ?Past Medical History:  ?Diagnosis Date  ? Anxiety   ? Cough 06/07/2020  ? Depression   ? Diabetes mellitus without complication (HCC)   ? Gestational diabetes   ? Insomnia   ? Paranoid schizophrenia (HCC)   ? Sinus drainage 11/01/2020  ?  ?Past Surgical History:  ?Procedure Laterality Date  ? APPENDECTOMY    ? BIOPSY N/A 01/24/2021  ? Procedure: BIOPSY;  Surgeon: Toney Reil, MD;  Location: [facility];  Service: Endoscopy;  Laterality: N/A;  ? COLONOSCOPY WITH PROPOFOL N/A 01/24/2021  ? Procedure: COLONOSCOPY WITH PROPOFOL;  Surgeon: Toney Reil, MD;  Location: [facility];  Service: Endoscopy;  Laterality: N/A;  ? HERNIA REPAIR    ? POLYPECTOMY N/A 01/24/2021  ? Procedure: POLYPECTOMY;  Surgeon: Toney Reil, MD;  Location: [facility];  Service: Endoscopy;   Laterality: N/A;  ? TUBAL LIGATION Bilateral 07/03/2016  ? Procedure: POST PARTUM TUBAL LIGATION BY SALPINGECTOMY;  Surgeon: Nadara Mustard, MD;  Location: [facility] ORS;  Service: Gynecology;  Laterality: Bilateral;  ? ?Family History:  ?Family History  ?Problem Relation Age of Onset  ? Diabetes Paternal Aunt   ? Diabetes Paternal Uncle   ? Diabetes Maternal Grandmother   ? Heart disease Maternal Grandmother   ? Hypertension Maternal Grandmother   ? Colon cancer Maternal Grandmother   ? Diabetes Maternal Grandfather   ? Ovarian cancer Cousin 25  ?     ? accurate family history  ? Uterine cancer Cousin   ? ?Family Psychiatric  History: Multiple people with anxiety ?Social History:  ?Social History  ? ?Substance and Sexual Activity  ?Alcohol Use No  ?   ?Social History  ? ?Substance and Sexual Activity  ?Drug Use No  ?  ?Social History  ? ?Socioeconomic History  ? Marital status: Married  ?  Spouse name: Not on file  ? Number of children: Not on file  ? Years of education: Not on file  ? Highest education level: Not on file  ?Occupational History  ? Not on file  ?Tobacco Use  ? Smoking status: Every Day  ?  Packs/day: 0.50  ?  Types: Cigarettes  ? Smokeless tobacco: Never  ?Vaping Use  ? Vaping Use: Never used  ?Substance and Sexual Activity  ? Alcohol use: No  ? Drug use: No  ? Sexual activity: Yes  ?  Birth control/protection: Injection  ?  Other Topics Concern  ? Not on file  ?Social History Narrative  ? Not on file  ? ?Social Determinants of Health  ? ?Financial Resource Strain: Not on file  ?Food Insecurity: Not on file  ?Transportation Needs: Not on file  ?Physical Activity: Not on file  ?Stress: Not on file  ?Social Connections: Not on file  ? ?Additional Social History:  ?  ?  ?  ?  ?  ?  ?  ?  ?  ?  ?  ? ?Sleep: Fair ? ?Appetite:  Fair ? ?Current Medications: ?Current Facility-Administered Medications  ?Medication Dose Route Frequency Provider Last Rate Last Admin  ? acetaminophen (TYLENOL) tablet 650 mg  650 mg  Oral Q6H PRN Charm RingsLord, Jamison Y, NP      ? alum & mag hydroxide-simeth (MAALOX/MYLANTA) 200-200-20 MG/5ML suspension 30 mL  30 mL Oral Q4H PRN Charm RingsLord, Jamison Y, NP   30 mL at 11/02/21 0859  ? diazepam (VALIUM) tablet 5 mg  5 mg Oral BID Clapacs, Jackquline DenmarkJohn T, MD   5 mg at 11/02/21 0859  ? diphenhydrAMINE (BENADRYL) capsule 50 mg  50 mg Oral Q6H PRN Clapacs, Jackquline DenmarkJohn T, MD   50 mg at 10/31/21 2139  ? Or  ? diphenhydrAMINE (BENADRYL) injection 50 mg  50 mg Intramuscular Q6H PRN Clapacs, John T, MD      ? haloperidol (HALDOL) tablet 5 mg  5 mg Oral Q6H PRN Clapacs, John T, MD      ? Or  ? haloperidol lactate (HALDOL) injection 5 mg  5 mg Intramuscular Q6H PRN Clapacs, John T, MD      ? lithium carbonate (LITHOBID) CR tablet 600 mg  600 mg Oral QHS Clapacs, John T, MD      ? LORazepam (ATIVAN) tablet 0.5 mg  0.5 mg Oral Daily PRN Charm RingsLord, Jamison Y, NP   0.5 mg at 10/31/21 2139  ? magnesium hydroxide (MILK OF MAGNESIA) suspension 30 mL  30 mL Oral Daily PRN Charm RingsLord, Jamison Y, NP   30 mL at 11/01/21 0750  ? metFORMIN (GLUCOPHAGE) tablet 500 mg  500 mg Oral BID WC Charm RingsLord, Jamison Y, NP   500 mg at 11/02/21 16100859  ? nicotine polacrilex (NICORETTE) gum 2 mg  2 mg Oral PRN Clapacs, Jackquline DenmarkJohn T, MD   2 mg at 10/31/21 2008  ? OLANZapine (ZYPREXA) injection 10 mg  10 mg Intramuscular QHS PRN Clapacs, John T, MD      ? ? ?Lab Results: No results found for this or any previous visit (from the past 48 hour(s)). ? ?Blood Alcohol level:  ?Lab Results  ?Component Value Date  ? ETH <10 10/30/2021  ? ETH <5 09/06/2015  ? ? ?Metabolic Disorder Labs: ?Lab Results  ?Component Value Date  ? HGBA1C 5.5 09/07/2015  ? ?Lab Results  ?Component Value Date  ? PROLACTIN 14.9 09/07/2015  ? ?Lab Results  ?Component Value Date  ? CHOL 166 09/13/2019  ? TRIG 159 (H) 09/13/2019  ? HDL 47 09/13/2019  ? CHOLHDL 3.5 09/13/2019  ? VLDL 21 09/07/2015  ? LDLCALC 91 09/13/2019  ? LDLCALC 102 (H) 09/07/2015  ? ? ?Physical Findings: ?AIMS:  , ,  ,  ,    ?CIWA:    ?COWS:     ? ?Musculoskeletal: ?Strength & Muscle Tone: within normal limits ?Gait & Station: normal ?Patient leans: Backward ? ?Psychiatric Specialty Exam: ? ?Presentation  ?General Appearance: Bizarre ? ?Eye Contact:Fleeting ? ?Speech:Pressured ? ?Speech Volume:Increased ? ?Handedness:Right ? ? ?Mood  and Affect  ?Mood:Irritable; Euphoric; Anxious ? ?Affect:Inappropriate ? ? ?Thought Process  ?Thought Processes:Disorganized; Irrevelant ? ?Descriptions of Associations:Loose ? ?Orientation:Full (Time, Place and Person) ? ?Thought Content:Illogical; Paranoid Ideation; Rumination; Scattered; Tangential ? ?History of Schizophrenia/Schizoaffective disorder:No ? ?Duration of Psychotic Symptoms:Greater than six months ? ?Hallucinations:No data recorded ?Ideas of Reference:None ? ?Suicidal Thoughts:No data recorded ?Homicidal Thoughts:No data recorded ? ?Sensorium  ?Memory:Immediate Poor; Recent Poor; Remote Poor ? ?Judgment:Poor ? ?Insight:Poor ? ? ?Executive Functions  ?Concentration:Poor ? ?Attention Span:Poor ? ?Recall:Poor ? ?Fund of Knowledge:Poor ? ?Language:Poor ? ? ?Psychomotor Activity  ?Psychomotor Activity:No data recorded ? ?Assets  ?Assets:Communication Skills; Financial Resources/Insurance; Resilience; Social Support ? ? ?Sleep  ?Sleep:No data recorded ? ? ?Physical Exam: ?Physical Exam ?Constitutional:   ?   Appearance: Normal appearance. She is normal weight.  ?HENT:  ?   Head: Normocephalic and atraumatic.  ?   Right Ear: Tympanic membrane normal.  ?   Left Ear: Tympanic membrane normal.  ?   Nose: Nose normal.  ?   Mouth/Throat:  ?   Mouth: Mucous membranes are moist.  ?   Pharynx: Oropharynx is clear.  ?Eyes:  ?   Extraocular Movements: Extraocular movements intact.  ?   Conjunctiva/sclera: Conjunctivae normal.  ?   Pupils: Pupils are equal, round, and reactive to light.  ?Cardiovascular:  ?   Rate and Rhythm: Normal rate and regular rhythm.  ?   Pulses: Normal pulses.  ?   Heart sounds: Normal heart sounds.   ?Pulmonary:  ?   Effort: Pulmonary effort is normal.  ?   Breath sounds: Normal breath sounds.  ?Abdominal:  ?   General: Abdomen is flat. Bowel sounds are normal.  ?   Palpations: Abdomen is soft.  ?Musculoskeletal:  ?   Cervic

## 2021-11-03 ENCOUNTER — Encounter: Payer: Self-pay | Admitting: Psychiatry

## 2021-11-03 NOTE — Progress Notes (Addendum)
[facility] MD Progress Note ? ?11/03/2021 9:23 AM ?Frances Sanders  ?MRN:  [id number] ?Subjective:  Patient was adamant about not taking Lithium because she does not like the metallic taste it leaves in her mouth. She took it with much coaxing, after several attempts to cheek it. She is agreeable to taking Olanzapine. She denies all symptoms of mental illness and continues to be delusional, saying that they put illness into her blood and are sucking it out when they draw her blood. She is also resistant to Lithium because she says they are only giving it to her to make money and she will not help them make their money. ? Patient did get some sleep last night and presents slightly better this morning. She reports missing her kids and is making calls to them. She is willing to keep taking her medicines, still has flight of ideas, loosening of associations and paranoia.  ?Principal Problem: Bipolar I disorder, most recent episode (or current) manic (HCC) ?Diagnosis: Principal Problem: ?  Bipolar I disorder, most recent episode (or current) manic (HCC) ? ?Total Time spent with patient: 30 minutes ? ?Past Psychiatric History: Past history of episodes of severe depression and psychosis widely scattered usually under stress ? ?Past Medical History:  ?Past Medical History:  ?Diagnosis Date  ? Anxiety   ? Cough 06/07/2020  ? Depression   ? Diabetes mellitus without complication (HCC)   ? Gestational diabetes   ? Insomnia   ? Paranoid schizophrenia (HCC)   ? Sinus drainage 11/01/2020  ?  ?Past Surgical History:  ?Procedure Laterality Date  ? APPENDECTOMY    ? BIOPSY N/A 01/24/2021  ? Procedure: BIOPSY;  Surgeon: Toney Reil, MD;  Location: [facility];  Service: Endoscopy;  Laterality: N/A;  ? COLONOSCOPY WITH PROPOFOL N/A 01/24/2021  ? Procedure: COLONOSCOPY WITH PROPOFOL;  Surgeon: Toney Reil, MD;  Location: [facility];  Service: Endoscopy;  Laterality: N/A;  ? HERNIA REPAIR    ? POLYPECTOMY N/A  01/24/2021  ? Procedure: POLYPECTOMY;  Surgeon: Toney Reil, MD;  Location: [facility];  Service: Endoscopy;  Laterality: N/A;  ? TUBAL LIGATION Bilateral 07/03/2016  ? Procedure: POST PARTUM TUBAL LIGATION BY SALPINGECTOMY;  Surgeon: Nadara Mustard, MD;  Location: [facility] ORS;  Service: Gynecology;  Laterality: Bilateral;  ? ?Family History:  ?Family History  ?Problem Relation Age of Onset  ? Diabetes Paternal Aunt   ? Diabetes Paternal Uncle   ? Diabetes Maternal Grandmother   ? Heart disease Maternal Grandmother   ? Hypertension Maternal Grandmother   ? Colon cancer Maternal Grandmother   ? Diabetes Maternal Grandfather   ? Ovarian cancer Cousin 25  ?     ? accurate family history  ? Uterine cancer Cousin   ? ?Family Psychiatric  History: Multiple people with anxiety ?Social History:  ?Social History  ? ?Substance and Sexual Activity  ?Alcohol Use No  ?   ?Social History  ? ?Substance and Sexual Activity  ?Drug Use No  ?  ?Social History  ? ?Socioeconomic History  ? Marital status: Married  ?  Spouse name: Not on file  ? Number of children: Not on file  ? Years of education: Not on file  ? Highest education level: Not on file  ?Occupational History  ? Not on file  ?Tobacco Use  ? Smoking status: Every Day  ?  Packs/day: 0.50  ?  Types: Cigarettes  ? Smokeless tobacco: Never  ?Vaping Use  ? Vaping  Use: Never used  ?Substance and Sexual Activity  ? Alcohol use: No  ? Drug use: No  ? Sexual activity: Yes  ?  Birth control/protection: Injection  ?Other Topics Concern  ? Not on file  ?Social History Narrative  ? Not on file  ? ?Social Determinants of Health  ? ?Financial Resource Strain: Not on file  ?Food Insecurity: Not on file  ?Transportation Needs: Not on file  ?Physical Activity: Not on file  ?Stress: Not on file  ?Social Connections: Not on file  ? ?Additional Social History:  ?  ?  ?  ?  ?  ?  ?  ?  ?  ?  ?  ? ?Sleep: Good ? ?Appetite:  Fair ? ?Current Medications: ?Current Facility-Administered  Medications  ?Medication Dose Route Frequency Provider Last Rate Last Admin  ? acetaminophen (TYLENOL) tablet 650 mg  650 mg Oral Q6H PRN Charm Rings, NP      ? alum & mag hydroxide-simeth (MAALOX/MYLANTA) 200-200-20 MG/5ML suspension 30 mL  30 mL Oral Q4H PRN Charm Rings, NP   30 mL at 11/02/21 0859  ? diazepam (VALIUM) tablet 5 mg  5 mg Oral BID Clapacs, Jackquline Denmark, MD   5 mg at 11/03/21 5102  ? diphenhydrAMINE (BENADRYL) capsule 50 mg  50 mg Oral Q6H PRN Clapacs, Jackquline Denmark, MD   50 mg at 11/02/21 2208  ? Or  ? diphenhydrAMINE (BENADRYL) injection 50 mg  50 mg Intramuscular Q6H PRN Clapacs, John T, MD      ? haloperidol (HALDOL) tablet 5 mg  5 mg Oral Q6H PRN Clapacs, John T, MD      ? Or  ? haloperidol lactate (HALDOL) injection 5 mg  5 mg Intramuscular Q6H PRN Clapacs, John T, MD      ? lithium carbonate (LITHOBID) CR tablet 600 mg  600 mg Oral QHS Clapacs, John T, MD   600 mg at 11/02/21 2026  ? LORazepam (ATIVAN) tablet 0.5 mg  0.5 mg Oral Daily PRN Charm Rings, NP   0.5 mg at 10/31/21 2139  ? magnesium hydroxide (MILK OF MAGNESIA) suspension 30 mL  30 mL Oral Daily PRN Charm Rings, NP   30 mL at 11/01/21 0750  ? metFORMIN (GLUCOPHAGE) tablet 500 mg  500 mg Oral BID WC Charm Rings, NP   500 mg at 11/02/21 5852  ? nicotine polacrilex (NICORETTE) gum 2 mg  2 mg Oral PRN Clapacs, Jackquline Denmark, MD   2 mg at 10/31/21 2008  ? OLANZapine (ZYPREXA) injection 10 mg  10 mg Intramuscular QHS PRN Clapacs, John T, MD      ? OLANZapine (ZYPREXA) tablet 5 mg  5 mg Oral QHS Sanna Porcaro   5 mg at 11/02/21 2026  ? ? ?Lab Results: No results found for this or any previous visit (from the past 48 hour(s)). ? ?Blood Alcohol level:  ?Lab Results  ?Component Value Date  ? ETH <10 10/30/2021  ? ETH <5 09/06/2015  ? ? ?Metabolic Disorder Labs: ?Lab Results  ?Component Value Date  ? HGBA1C 5.5 09/07/2015  ? ?Lab Results  ?Component Value Date  ? PROLACTIN 14.9 09/07/2015  ? ?Lab Results  ?Component Value Date  ? CHOL 166  09/13/2019  ? TRIG 159 (H) 09/13/2019  ? HDL 47 09/13/2019  ? CHOLHDL 3.5 09/13/2019  ? VLDL 21 09/07/2015  ? LDLCALC 91 09/13/2019  ? LDLCALC 102 (H) 09/07/2015  ? ? ?Physical Findings: ?AIMS:  , ,  ,  ,    ?  CIWA:    ?COWS:    ? ?Musculoskeletal: ?Strength & Muscle Tone: within normal limits ?Gait & Station: normal ?Patient leans: N/A ? ?Psychiatric Specialty Exam: ? ?Presentation  ?General Appearance: Appropriate for Environment; Disheveled; Neat ? ?Eye Contact:Fair ? ?Speech:Pressured ? ?Speech Volume:Increased ? ?Handedness:Right ? ? ?Mood and Affect  ?Mood:Angry; Anxious; Irritable; Labile; Euphoric ? ?Affect:Labile ? ? ?Thought Process  ?Thought Processes:Disorganized ? ?Descriptions of Associations:Loose ? ?Orientation:Full (Time, Place and Person) ? ?Thought Content:Paranoid Ideation; Scattered; Delusions ? ?History of Schizophrenia/Schizoaffective disorder:No ? ?Duration of Psychotic Symptoms:Greater than six months ? ?Hallucinations:Hallucinations: None ? ?Ideas of Reference:Paranoia ? ?Suicidal Thoughts:Suicidal Thoughts: No ? ?Homicidal Thoughts:Homicidal Thoughts: No ? ? ?Sensorium  ?Memory:Immediate Good; Recent Good ? ?Judgment:Impaired ? ?Insight:Lacking ? ? ?Executive Functions  ?Concentration:Poor ? ?Attention Span:Poor ? ?Recall:Fair ? ?Fund of Knowledge:Fair ? ?Language:Fair ? ? ?Psychomotor Activity  ?Psychomotor Activity:Psychomotor Activity: Normal ? ? ?Assets  ?Assets:Communication Skills; Desire for Improvement; Housing; Physical Health; Social Support ? ? ?Sleep  ?Sleep:Sleep: Fair ? ? ? ?Physical Exam: ?Physical Exam ?Constitutional:   ?   Appearance: Normal appearance. She is normal weight.  ?HENT:  ?   Head: Normocephalic and atraumatic.  ?   Right Ear: Tympanic membrane normal.  ?   Left Ear: Tympanic membrane normal.  ?   Nose: Nose normal.  ?   Mouth/Throat:  ?   Mouth: Mucous membranes are moist.  ?   Pharynx: Oropharynx is clear.  ?Eyes:  ?   Extraocular Movements: Extraocular  movements intact.  ?   Conjunctiva/sclera: Conjunctivae normal.  ?   Pupils: Pupils are equal, round, and reactive to light.  ?Cardiovascular:  ?   Rate and Rhythm: Normal rate and regular rhythm.  ?Pulmonary:

## 2021-11-03 NOTE — Progress Notes (Signed)
Patient denies pain. Patient endorses anxiety. Patient refused PRN ativan. Patient denies depression. Patient refused scheduled metformin but was compliant with scheduled valium.  ?Patient requested PRN medication for indigestion. PRN Maalox/Mylanta has been provided to patient.  ?Patient frequently pacing the unit throughout shift.  ?Q15 minute safety checks maintained. Patient remains safe on the unit at this time.  ?

## 2021-11-03 NOTE — Group Note (Signed)
[facility] LCSW Group Therapy Note ? ? ?Group Date: 11/03/2021 ?Start Time: 1300 ?End Time: 1400 ? ? ?Type of Therapy and Topic: Group Therapy: Avoiding Self-Sabotaging and Enabling Behaviors ? ?Participation Level: Did Not Attend ? ?Mood: ? ?Description of Group:  ?In this group, patients will learn how to identify obstacles, self-sabotaging and enabling behaviors, as well as: what are they, why do we do them and what needs these behaviors meet. Discuss unhealthy relationships and how to have positive healthy boundaries with those that sabotage and enable. Explore aspects of self-sabotage and enabling in yourself and how to limit these self-destructive behaviors in everyday life. ? ? ?Therapeutic Goals: ?1. Patient will identify one obstacle that relates to self-sabotage and enabling behaviors ?2. Patient will identify one personal self-sabotaging or enabling behavior they did prior to admission ?3. Patient will state a plan to change the above identified behavior ?4. Patient will demonstrate ability to communicate their needs through discussion and/or role play.  ? ? ?Summary of Patient Progress: Due to limited staffing, group was not held on the unit.  ? ? ? ? ?Therapeutic Modalities:  ?Cognitive Behavioral Therapy ?Person-Centered Therapy ?Motivational Interviewing ? ? ? ?Karilyn Wind K Mattthew Ziomek, LCSWA ?

## 2021-11-03 NOTE — Progress Notes (Signed)
Patient was adamant about not taking Lithium because she does not like the metallic taste it leaves in her mouth. She took it with much coaxing, after several attempts to cheek it. She is agreeable to taking Olanzapine. She denies all symptoms of mental illness and continues to be delusional, saying that they put illness into her blood and are sucking it out when they draw her blood. She is also resistant to Lithium because she says they are only giving it to her to make money and she will not help them make their money. ?

## 2021-11-03 NOTE — [facility] Suicide Risk Assessment (Signed)
[facility] INPATIENT:  Family/Significant Other Suicide Prevention Education ? ?Suicide Prevention Education:  ?Education Completed; Eunice Blase (father), [phone]. has been identified by the patient as the family member/significant other with whom the patient will be residing, and identified as the person(s) who will aid the patient in the event of a mental health crisis (suicidal ideations/suicide attempt).  With written consent from the patient, the family member/significant other has been provided the following suicide prevention education, prior to the and/or following the discharge of the patient. ? ?The suicide prevention education provided includes the following: ?Suicide risk factors ?Suicide prevention and interventions ?National Suicide Hotline telephone number ?[facility] assessment telephone number ?[facility] Emergency Assistance 911 ?[place] and/or Residential Mobile Crisis Unit telephone number ? ?Request made of family/significant other to: ?Remove weapons (e.g., guns, rifles, knives), all items previously/currently identified as safety concern.   ?Remove drugs/medications (over-the-counter, prescriptions, illicit drugs), all items previously/currently identified as a safety concern. ? ?The family member/significant other verbalizes understanding of the suicide prevention education information provided. The family member/significant other agrees to remove the items of safety concern listed above. ? ?Frances Sanders ?11/03/2021, 5:13 PM ?

## 2021-11-03 NOTE — Plan of Care (Signed)
?  Problem: Education: ?Goal: Knowledge of [facility] Education information/materials will improve ?Outcome: Progressing ?Goal: Verbalization of understanding the information provided will improve ?Outcome: Progressing ?  ?Problem: Health Behavior/Discharge Planning: ?Goal: Compliance with treatment plan for underlying cause of condition will improve ?Outcome: Progressing ?  ?Problem: Safety: ?Goal: Periods of time without injury will increase ?Outcome: Progressing ?  ?Problem: Education: ?Goal: Knowledge of the prescribed therapeutic regimen will improve ?Outcome: Not Progressing ?  ?Problem: Education: ?Goal: Will be free of psychotic symptoms ?Outcome: Not Progressing ?Goal: Knowledge of the prescribed therapeutic regimen will improve ?Outcome: Not Progressing ?  ?Problem: Self-Care: ?Goal: Ability to participate in self-care as condition permits will improve ?Outcome: Progressing ?  ?

## 2021-11-03 NOTE — Plan of Care (Signed)
?  Problem: Education: ?Goal: Knowledge of [facility] Education information/materials will improve ?Outcome: Not Progressing ?Goal: Emotional status will improve ?Outcome: Not Progressing ?Goal: Mental status will improve ?Outcome: Not Progressing ?Goal: Verbalization of understanding the information provided will improve ?Outcome: Not Progressing ?  ?Problem: Activity: ?Goal: Interest or engagement in activities will improve ?Outcome: Not Progressing ?Goal: Sleeping patterns will improve ?Outcome: Not Progressing ?  ?Problem: Coping: ?Goal: Ability to verbalize frustrations and anger appropriately will improve ?Outcome: Not Progressing ?Goal: Ability to demonstrate self-control will improve ?Outcome: Not Progressing ?  ?Problem: Health Behavior/Discharge Planning: ?Goal: Identification of resources available to assist in meeting health care needs will improve ?Outcome: Not Progressing ?Goal: Compliance with treatment plan for underlying cause of condition will improve ?Outcome: Not Progressing ?  ?Problem: Physical Regulation: ?Goal: Ability to maintain clinical measurements within normal limits will improve ?Outcome: Not Progressing ?  ?Problem: Safety: ?Goal: Periods of time without injury will increase ?Outcome: Not Progressing ?  ?Problem: Education: ?Goal: Utilization of techniques to improve thought processes will improve ?Outcome: Not Progressing ?Goal: Knowledge of the prescribed therapeutic regimen will improve ?Outcome: Not Progressing ?  ?Problem: Activity: ?Goal: Interest or engagement in leisure activities will improve ?Outcome: Not Progressing ?Goal: Imbalance in normal sleep/wake cycle will improve ?Outcome: Not Progressing ?  ?Problem: Activity: ?Goal: Will verbalize the importance of balancing activity with adequate rest periods ?Outcome: Not Progressing ?  ?Problem: Education: ?Goal: Will be free of psychotic symptoms ?Outcome: Not Progressing ?Goal: Knowledge of the prescribed therapeutic  regimen will improve ?Outcome: Not Progressing ?  ?Problem: Safety: ?Goal: Ability to redirect hostility and anger into socially appropriate behaviors will improve ?Outcome: Not Progressing ?Goal: Ability to remain free from injury will improve ?Outcome: Not Progressing ?  ?Problem: Self-Care: ?Goal: Ability to participate in self-care as condition permits will improve ?Outcome: Not Progressing ?  ?

## 2021-11-04 DIAGNOSIS — F3110 Bipolar disorder, current episode manic without psychotic features, unspecified: Secondary | ICD-10-CM | POA: Diagnosis not present

## 2021-11-04 MED ORDER — OLANZAPINE 10 MG IM SOLR
10.0000 mg | Freq: Every evening | INTRAMUSCULAR | Status: DC | PRN
  Filled 2021-11-04: qty 10

## 2021-11-04 MED ORDER — OLANZAPINE 10 MG PO TABS
10.0000 mg | ORAL_TABLET | Freq: Every day | ORAL | Status: DC
  Administered 2021-11-04 – 2021-11-06 (×3): 10 mg via ORAL
  Filled 2021-11-04 (×3): qty 1
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
## 2021-11-04 NOTE — Progress Notes (Signed)
The patient has had somatic complaints on shift, vitals checked and within normal range, she refused Lithium and Zyprexa on shift. She has been restless through out most of the shift and only slept a couple of hours.  ?

## 2021-11-04 NOTE — Group Note (Signed)
[facility] LCSW Group Therapy Note ? ? ? ?Group Date: 11/04/2021 ?Start Time: 1330 ?End Time: 1430 ? ?Type of Therapy and Topic:  Group Therapy:  Overcoming Obstacles ? ?Participation Level:  [facility] PARTICIPATION LEVEL: Active ? ?Mood: ? ?Description of Group:   ?In this group patients will be encouraged to explore what they see as obstacles to their own wellness and recovery. They will be guided to discuss their thoughts, feelings, and behaviors related to these obstacles. The group will process together ways to cope with barriers, with attention given to specific choices patients can make. Each patient will be challenged to identify changes they are motivated to make in order to overcome their obstacles. This group will be process-oriented, with patients participating in exploration of their own experiences as well as giving and receiving support and challenge from other group members. ? ?Therapeutic Goals: ?1. Patient will identify personal and current obstacles as they relate to admission. ?2. Patient will identify barriers that currently interfere with their wellness or overcoming obstacles.  ?3. Patient will identify feelings, thought process and behaviors related to these barriers. ?4. Patient will identify two changes they are willing to make to overcome these obstacles:  ? ? ?Summary of Patient Progress ?Patient was present in group.  Patient was appropriate in group. Patient did seem to focus on her children a lot in group, however, was able to tie it back into the subject matter.  She reports that she has been having a recent barrier with the relationships that she finds herself in.  ? ?Therapeutic Modalities:   ?Cognitive Behavioral Therapy ?Solution Focused Therapy ?Motivational Interviewing ?Relapse Prevention Therapy ? ? ?Harden Mo, LCSW ?

## 2021-11-04 NOTE — Progress Notes (Addendum)
Recreation Therapy Notes ? ?Date: 11/04/2021 ? ?Time: 10:35 am   ? ?Location: Court yard  ? ?Behavioral response: Appropriate, Restless  ? ?Intervention Topic:  Leisure   ? ?Discussion/Intervention:  ?Group content today was focused on leisure. The group defined what leisure is and some positive leisure activities they participate in. Individuals identified the difference between good and bad leisure. Participants expressed how they feel after participating in the leisure of their choice. The group discussed how they go about picking a leisure activity and if others are involved in their leisure activities. The patient stated how many leisure activities they have to choose from and reasons why it is important to have leisure time. Individuals participated in the intervention ?Exploration of Leisure? where they had a chance to identify new leisure activities as well as benefits of leisure. ?Clinical Observations/Feedback: ?Patient came to group and was focused on what peers and staff had to say about leisure. Participant participated in open leisure while being appropriate with staff and peers. Individual walked in and out of group several times.    ?Linkyn Gobin LRT/CTRS  ? ? ? ? ? ? ? ? ? ?Erinn Mendosa ?11/04/2021 12:23 PM ?

## 2021-11-04 NOTE — Plan of Care (Signed)
Patient in & out of her room. Patient states " I don't need any toxic thing in my blood. I need to check my blood. " Patient took Valium. Refused Metformin. No aggressive behaviors noted. Denies SI,HI and AVH. Appetite and energy level good. Attended groups. Support and encouragement given. ?

## 2021-11-05 DIAGNOSIS — F3110 Bipolar disorder, current episode manic without psychotic features, unspecified: Secondary | ICD-10-CM | POA: Diagnosis not present

## 2021-11-05 MED ORDER — PANTOPRAZOLE SODIUM 40 MG PO TBEC
40.0000 mg | DELAYED_RELEASE_TABLET | Freq: Every day | ORAL | Status: DC
  Administered 2021-11-06 – 2021-11-07 (×2): 40 mg via ORAL
  Filled 2021-11-05 (×4): qty 1

## 2021-11-05 MED ORDER — HYDROCORTISONE 1 % EX CREA
TOPICAL_CREAM | Freq: Two times a day (BID) | CUTANEOUS | Status: DC | PRN
  Filled 2021-11-05: qty 28

## 2021-11-05 MED ORDER — HYDROCORT-PRAMOXINE (PERIANAL) 2.5-1 % EX CREA: TOPICAL_CREAM | Freq: Two times a day (BID) | CUTANEOUS | Status: DC | PRN

## 2021-11-05 NOTE — Progress Notes (Signed)
D: Pt alert and oriented. Pt rates depression 3/10 and anxiety 7/10.  Pt reports energy level as good and concentration as being good. Pt reports sleep last night as being good. Pt denies experiencing any pain at this time. Pt denies experiencing any SI/HI, or AVH at this time.  ? ?A: Scheduled medications administered to pt, per MD orders. Support and encouragement provided. Frequent verbal contact made. Routine safety checks conducted q15 minutes.  ? ?R: No adverse drug reactions noted. Pt verbally contracts for safety at this time. Pt complaint with medications and treatment plan. Pt interacts well with others on the unit. Pt remains safe at this time. Will continue to monitor.    ?

## 2021-11-05 NOTE — Progress Notes (Signed)
[facility] MD Progress Note ? ?11/05/2021 3:55 PM ?Frances Sanders  ?MRN:  [id number] ?Subjective: Patient seen in follow-up for psychosis.  Patient appears to be a little better today.  Mostly her conversation made sense although she still has some odd paranoia about her husband and her husband's family.  She accuses him of dealing drugs and stuff like that that she has no evidence for.  She stayed mostly on topic however.  Had a couple of medical request like being back on medicine for gastric esophageal reflux and getting some anal cream.  Otherwise feeling better.  Slept better last night.  Does not appear to be responding to hallucinations. ?Principal Problem: Bipolar I disorder, most recent episode (or current) manic (HCC) ?Diagnosis: Principal Problem: ?  Bipolar I disorder, most recent episode (or current) manic (HCC) ? ?Total Time spent with patient: 30 minutes ? ?Past Psychiatric History: Past history of recurrent mood episodes ? ?Past Medical History:  ?Past Medical History:  ?Diagnosis Date  ? Anxiety   ? Cough 06/07/2020  ? Depression   ? Diabetes mellitus without complication (HCC)   ? Gestational diabetes   ? Insomnia   ? Paranoid schizophrenia (HCC)   ? Sinus drainage 11/01/2020  ?  ?Past Surgical History:  ?Procedure Laterality Date  ? APPENDECTOMY    ? BIOPSY N/A 01/24/2021  ? Procedure: BIOPSY;  Surgeon: Toney Reil, MD;  Location: [facility];  Service: Endoscopy;  Laterality: N/A;  ? COLONOSCOPY WITH PROPOFOL N/A 01/24/2021  ? Procedure: COLONOSCOPY WITH PROPOFOL;  Surgeon: Toney Reil, MD;  Location: [facility];  Service: Endoscopy;  Laterality: N/A;  ? HERNIA REPAIR    ? POLYPECTOMY N/A 01/24/2021  ? Procedure: POLYPECTOMY;  Surgeon: Toney Reil, MD;  Location: [facility];  Service: Endoscopy;  Laterality: N/A;  ? TUBAL LIGATION Bilateral 07/03/2016  ? Procedure: POST PARTUM TUBAL LIGATION BY SALPINGECTOMY;  Surgeon: Nadara Mustard, MD;  Location:  [facility];  Service: Gynecology;  Laterality: Bilateral;  ? ?Family History:  ?Family History  ?Problem Relation Age of Onset  ? Diabetes Paternal Aunt   ? Diabetes Paternal Uncle   ? Diabetes Maternal Grandmother   ? Heart disease Maternal Grandmother   ? Hypertension Maternal Grandmother   ? Colon cancer Maternal Grandmother   ? Diabetes Maternal Grandfather   ? Ovarian cancer Cousin 25  ?     ? accurate family history  ? Uterine cancer Cousin   ? ?Family Psychiatric  History: See previous ?Social History:  ?Social History  ? ?Substance and Sexual Activity  ?Alcohol Use No  ?   ?Social History  ? ?Substance and Sexual Activity  ?Drug Use No  ?  ?Social History  ? ?Socioeconomic History  ? Marital status: Married  ?  Spouse name: Not on file  ? Number of children: Not on file  ? Years of education: Not on file  ? Highest education level: Not on file  ?Occupational History  ? Not on file  ?Tobacco Use  ? Smoking status: Every Day  ?  Packs/day: 0.50  ?  Types: Cigarettes  ? Smokeless tobacco: Never  ?Vaping Use  ? Vaping Use: Never used  ?Substance and Sexual Activity  ? Alcohol use: No  ? Drug use: No  ? Sexual activity: Yes  ?  Birth control/protection: Injection  ?Other Topics Concern  ? Not on file  ?Social History Narrative  ? Not on file  ? ?Social Determinants of Health  ? ?  Financial Resource Strain: Not on file  ?Food Insecurity: Not on file  ?Transportation Needs: Not on file  ?Physical Activity: Not on file  ?Stress: Not on file  ?Social Connections: Not on file  ? ?Additional Social History:  ?  ?  ?  ?  ?  ?  ?  ?  ?  ?  ?  ? ?Sleep: Fair ? ?Appetite:  Fair ? ?Current Medications: ?Current Facility-Administered Medications  ?Medication Dose Route Frequency Provider Last Rate Last Admin  ? acetaminophen (TYLENOL) tablet 650 mg  650 mg Oral Q6H PRN Charm Rings, NP   650 mg at 11/03/21 2106  ? alum & mag hydroxide-simeth (MAALOX/MYLANTA) 200-200-20 MG/5ML suspension 30 mL  30 mL Oral Q4H PRN Charm Rings, NP   30 mL at 11/05/21 1518  ? diazepam (VALIUM) tablet 5 mg  5 mg Oral BID Arrion Broaddus, Jackquline Denmark, MD   5 mg at 11/05/21 0755  ? diphenhydrAMINE (BENADRYL) capsule 50 mg  50 mg Oral Q6H PRN Ahlana Slaydon, Jackquline Denmark, MD   50 mg at 11/02/21 2208  ? Or  ? diphenhydrAMINE (BENADRYL) injection 50 mg  50 mg Intramuscular Q6H PRN Erle Guster T, MD      ? haloperidol (HALDOL) tablet 5 mg  5 mg Oral Q6H PRN Deveon Kisiel T, MD      ? Or  ? haloperidol lactate (HALDOL) injection 5 mg  5 mg Intramuscular Q6H PRN Steed Kanaan T, MD      ? hydrocortisone-pramoxine [facility]) 2.5-1 % rectal cream   Rectal BID PRN Ardine Iacovelli, Jackquline Denmark, MD      ? LORazepam (ATIVAN) tablet 0.5 mg  0.5 mg Oral Daily PRN Charm Rings, NP   0.5 mg at 10/31/21 2139  ? magnesium hydroxide (MILK OF MAGNESIA) suspension 30 mL  30 mL Oral Daily PRN Charm Rings, NP   30 mL at 11/01/21 0750  ? metFORMIN (GLUCOPHAGE) tablet 500 mg  500 mg Oral BID WC Charm Rings, NP   500 mg at 11/02/21 0093  ? nicotine polacrilex (NICORETTE) gum 2 mg  2 mg Oral PRN Nasteho Glantz, Jackquline Denmark, MD   2 mg at 10/31/21 2008  ? OLANZapine (ZYPREXA) injection 10 mg  10 mg Intramuscular QHS PRN Jobani Sabado T, MD      ? OLANZapine (ZYPREXA) tablet 10 mg  10 mg Oral QHS Derotha Fishbaugh, Jackquline Denmark, MD   10 mg at 11/04/21 2015  ? pantoprazole (PROTONIX) EC tablet 40 mg  40 mg Oral Daily Justyce Baby T, MD      ? ? ?Lab Results: No results found for this or any previous visit (from the past 48 hour(s)). ? ?Blood Alcohol level:  ?Lab Results  ?Component Value Date  ? ETH <10 10/30/2021  ? ETH <5 09/06/2015  ? ? ?Metabolic Disorder Labs: ?Lab Results  ?Component Value Date  ? HGBA1C 5.5 09/07/2015  ? ?Lab Results  ?Component Value Date  ? PROLACTIN 14.9 09/07/2015  ? ?Lab Results  ?Component Value Date  ? CHOL 166 09/13/2019  ? TRIG 159 (H) 09/13/2019  ? HDL 47 09/13/2019  ? CHOLHDL 3.5 09/13/2019  ? VLDL 21 09/07/2015  ? LDLCALC 91 09/13/2019  ? LDLCALC 102 (H) 09/07/2015  ? ? ?Physical  Findings: ?AIMS:  , ,  ,  ,    ?CIWA:    ?COWS:    ? ?Musculoskeletal: ?Strength & Muscle Tone: within normal limits ?Gait & Station: normal ?Patient leans: N/A ? ?Psychiatric  Specialty Exam: ? ?Presentation  ?General Appearance: Appropriate for Environment; Disheveled; Neat ? ?Eye Contact:Fair ? ?Speech:Pressured ? ?Speech Volume:Increased ? ?Handedness:Right ? ? ?Mood and Affect  ?Mood:Angry; Anxious; Irritable; Labile; Euphoric ? ?Affect:Labile ? ? ?Thought Process  ?Thought Processes:Disorganized ? ?Descriptions of Associations:Loose ? ?Orientation:Full (Time, Place and Person) ? ?Thought Content:Paranoid Ideation; Scattered; Delusions ? ?History of Schizophrenia/Schizoaffective disorder:No ? ?Duration of Psychotic Symptoms:Greater than six months ? ?Hallucinations:No data recorded ?Ideas of Reference:Paranoia ? ?Suicidal Thoughts:No data recorded ?Homicidal Thoughts:No data recorded ? ?Sensorium  ?Memory:Immediate Good; Recent Good ? ?Judgment:Impaired ? ?Insight:Lacking ? ? ?Executive Functions  ?Concentration:Poor ? ?Attention Span:Poor ? ?Recall:Fair ? ?Fund of Knowledge:Fair ? ?Language:Fair ? ? ?Psychomotor Activity  ?Psychomotor Activity:No data recorded ? ?Assets  ?Assets:Communication Skills; Desire for Improvement; Housing; Physical Health; Social Support ? ? ?Sleep  ?Sleep:No data recorded ? ? ?Physical Exam: ?Physical Exam ?Vitals and nursing note reviewed.  ?Constitutional:   ?   Appearance: Normal appearance.  ?HENT:  ?   Head: Normocephalic and atraumatic.  ?   Mouth/Throat:  ?   Pharynx: Oropharynx is clear.  ?Eyes:  ?   Pupils: Pupils are equal, round, and reactive to light.  ?Cardiovascular:  ?   Rate and Rhythm: Normal rate and regular rhythm.  ?Pulmonary:  ?   Effort: Pulmonary effort is normal.  ?   Breath sounds: Normal breath sounds.  ?Abdominal:  ?   General: Abdomen is flat.  ?   Palpations: Abdomen is soft.  ?Musculoskeletal:     ?   General: Normal range of motion.  ?Skin: ?   General:  Skin is warm and dry.  ?Neurological:  ?   General: No focal deficit present.  ?   Mental Status: She is alert. Mental status is at baseline.  ?Psychiatric:     ?   Attention and Perception: Attention normal.     ?   Mood

## 2021-11-05 NOTE — Progress Notes (Signed)
Recreation Therapy Notes ? ?Date: 11/05/2021 ? ?Time: 10:20 am   ? ?Location: Craft room   ? ?Behavioral response: Appropriate ? ?Intervention Topic: Time Management   ? ?Discussion/Intervention:  ?Group content today was focused on time management. The group defined time management and identified healthy ways to manage time. Individuals expressed how much of the 24 hours they use in a day. Patients expressed how much time they use just for themselves personally. The group expressed how they have managed their time in the past. Individuals participated in the intervention ?Managing Life? where they had a chance to see how much of the 24 hours they use and where it goes. ?Clinical Observations/Feedback: ?Patient came to group late and focused on what peers and staff had to say about time management. Individual was social with peers and staff while participating in the intervention.    ?[name] LRT/CTRS  ? ? ? ? ? ? ? ?Frances Sanders ?11/05/2021 11:58 AM ?

## 2021-11-05 NOTE — Group Note (Deleted)
[facility] LCSW Group Therapy Note ? ? ?Group Date: 11/05/2021 ?Start Time: 1300 ?End Time: 1400 ? ?Type of Therapy/Topic:  Group Therapy:  Feelings about Diagnosis ? ?Participation Level:  {[facility] PARTICIPATION LEVEL:22264}  ? ?Mood: *** ? ? ?Description of Group:   ? This group will allow patients to explore their thoughts and feelings about diagnoses they have received. Patients will be guided to explore their level of understanding and acceptance of these diagnoses. Facilitator will encourage patients to process their thoughts and feelings about the reactions of others to their diagnosis, and will guide patients in identifying ways to discuss their diagnosis with significant others in their lives. This group will be process-oriented, with patients participating in exploration of their own experiences as well as giving and receiving support and challenge from other group members. ? ? ?Therapeutic Goals: ?1. Patient will demonstrate understanding of diagnosis as evidence by identifying two or more symptoms of the disorder:  ?2. Patient will be able to express two feelings regarding the diagnosis ?3. Patient will demonstrate ability to communicate their needs through discussion and/or role plays ? ?Summary of Patient Progress: ? ? ? ?*** ? ? ? ?Therapeutic Modalities:   ?Cognitive Behavioral Therapy ?Brief Therapy ?Feelings Identification  ? ? ?[place] J Cheney Ewart, LCSW ?

## 2021-11-05 NOTE — Plan of Care (Signed)
D: Pt alert and oriented. Pt reports however does not rate experiencing anxiety/depression at this time. Pt reports experiencing rectal pain at this time r/t diarrhea, MD notified. Pt denies experiencing any SI/HI, or AVH at this time, however at times can be observer responding to internal stimuli.  ? ?Pt is quick to begin a conversation however it can not easily be followed often. Pt can be needy at times. Pt refused her metformin and newly ordered Protonix, MD notified. Pt states she was only on metformin for weight loss and she would rather continue with the Mylanta vs the Protonix. ? ?A: Scheduled medications administered to pt, per MD orders. Support and encouragement provided. Frequent verbal contact made. Routine safety checks conducted q15 minutes.  ? ?R: No adverse drug reactions noted. Pt verbally contracts for safety at this time. Pt compliant with medications and treatment plan. Pt interacts well with others on the unit. Pt remains safe at this time. Will continue to monitor.  ?Problem: Education: ?Goal: Emotional status will improve ?Outcome: Not Progressing ?Goal: Mental status will improve ?Outcome: Not Progressing ?  ?

## 2021-11-05 NOTE — Group Note (Signed)
[facility] LCSW Group Therapy Note ? ? ?Group Date: 11/05/2021 ?Start Time: 1300 ?End Time: 1400 ? ?Type of Therapy/Topic:  Group Therapy:  Feelings about Diagnosis ? ?Participation Level:  Active  ? ?Mood: slightly elevated, hypomanic  ? ? ?Description of Group:   ? This group will allow patients to explore their thoughts and feelings about diagnoses they have received. Patients will be guided to explore their level of understanding and acceptance of these diagnoses. Facilitator will encourage patients to process their thoughts and feelings about the reactions of others to their diagnosis, and will guide patients in identifying ways to discuss their diagnosis with significant others in their lives. This group will be process-oriented, with patients participating in exploration of their own experiences as well as giving and receiving support and challenge from other group members. ? ? ?Therapeutic Goals: ?1. Patient will demonstrate understanding of diagnosis as evidence by identifying two or more symptoms of the disorder:  ?2. Patient will be able to express two feelings regarding the diagnosis ?3. Patient will demonstrate ability to communicate their needs through discussion and/or role plays ? ?Summary of Patient Progress: ?Patient was present for the entirety of the group session. Patient was an active listener and participated in the topic of discussion, provided helpful advice to others, and added nuance to topic of conversation. Patient shared she "probably" has PTSD "from something" when another patient began do describe his experiences with PTSD. 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 Patient provided some incite and good judgment pertaining to mental health literacy.  ? ? ? ?Therapeutic Modalities:   ?Cognitive Behavioral Therapy ?Brief Therapy ?Feelings Identification  ? ? ?Corky Crafts, LCSWA ?

## 2021-11-06 DIAGNOSIS — F3110 Bipolar disorder, current episode manic without psychotic features, unspecified: Secondary | ICD-10-CM | POA: Diagnosis not present

## 2021-11-06 NOTE — Group Note (Signed)
[facility] LCSW Group Therapy Note ? ? ?Group Date: 11/06/2021 ?Start Time: 1300 ?End Time: 1400 ? ? ?Type of Therapy/Topic:  Group Therapy:  Emotion Regulation ? ?Participation Level:  Active  ? ? ? ?Description of Group:   ? The purpose of this group is to assist patients in learning to regulate negative emotions and experience positive emotions. Patients will be guided to discuss ways in which they have been vulnerable to their negative emotions. These vulnerabilities will be juxtaposed with experiences of positive emotions or situations, and patients challenged to use positive emotions to combat negative ones. Special emphasis will be placed on coping with negative emotions in conflict situations, and patients will process healthy conflict resolution skills. ? ?Therapeutic Goals: ?Patient will identify two positive emotions or experiences to reflect on in order to balance out negative emotions:  ?Patient will label two or more emotions that they find the most difficult to experience:  ?Patient will be able to demonstrate positive conflict resolution skills through discussion or role plays:  ? ?Summary of Patient Progress: ? ? ?Patient was present for the entirety of the group session. Patient was an active listener and participated in the topic of discussion, provided helpful advice to others, and added nuance to topic of conversation.   ? ? ? ?Therapeutic Modalities:   ?Cognitive Behavioral Therapy ?Feelings Identification ?Dialectical Behavioral Therapy ? ? ?Karalee Height, Student-Social Work ?

## 2021-11-06 NOTE — Progress Notes (Signed)
Patient presents with a better affect than when this writer had her previously this admission. Pt denies SI/HI/AVH. Pt observed interacting appropriately with staff and peers on the unit. Pt compliant with medication administration per MD orders. Pt given education, support, and encouragement to be active in her treatment plan. Pt being monitored Q 15 minutes for safety per unit protocol. Pt remains safe on the unit.  ?

## 2021-11-06 NOTE — Plan of Care (Signed)
  Problem: Education: Goal: Emotional status will improve Outcome: Progressing Goal: Mental status will improve Outcome: Progressing   Problem: Health Behavior/Discharge Planning: Goal: Compliance with treatment plan for underlying cause of condition will improve Outcome: Progressing   

## 2021-11-06 NOTE — Progress Notes (Signed)
Patient alert and orient. Frequently visited nurses station with multiple requests. Reports the zyprexa is making her heart beat faster. Writer checked heart rated. Noted at 78. Patient states feels like her whole body is bad because of the medications. Patient then requested tylenol for generalized pain. Patient reports she slept well on last night and hope to sleep well again.  ?Pt denies any SI, HI, AVh. Took zyprexa po. Is wanting to be discharged, states 7 days is long enough.  ?Encouragement and support provided. Safety checks maintained. Medications given as prescribed. Pt receptive and remains safe on unit with q 15 min checks. ?

## 2021-11-06 NOTE — BH IP Treatment Plan (Signed)
Interdisciplinary Treatment and Diagnostic Plan Update ? ?11/06/2021 ?Time of Session: 0830  ?Frances Sanders ?MRN: [id number] ? ?Principal Diagnosis: Bipolar I disorder, most recent episode (or current) manic (HCC) ? ?Secondary Diagnoses: Principal Problem: ?  Bipolar I disorder, most recent episode (or current) manic (HCC) ? ? ?Current Medications:  ?Current Facility-Administered Medications  ?Medication Dose Route Frequency Provider Last Rate Last Admin  ? acetaminophen (TYLENOL) tablet 650 mg  650 mg Oral Q6H PRN Charm Rings, NP   650 mg at 11/06/21 0654  ? alum & mag hydroxide-simeth (MAALOX/MYLANTA) 200-200-20 MG/5ML suspension 30 mL  30 mL Oral Q4H PRN Charm Rings, NP   30 mL at 11/05/21 1518  ? diazepam (VALIUM) tablet 5 mg  5 mg Oral BID Clapacs, Jackquline Denmark, MD   5 mg at 11/06/21 7106  ? diphenhydrAMINE (BENADRYL) capsule 50 mg  50 mg Oral Q6H PRN Clapacs, Jackquline Denmark, MD   50 mg at 11/02/21 2208  ? Or  ? diphenhydrAMINE (BENADRYL) injection 50 mg  50 mg Intramuscular Q6H PRN Clapacs, John T, MD      ? haloperidol (HALDOL) tablet 5 mg  5 mg Oral Q6H PRN Clapacs, John T, MD      ? Or  ? haloperidol lactate (HALDOL) injection 5 mg  5 mg Intramuscular Q6H PRN Clapacs, John T, MD      ? hydrocortisone cream 1 %   Topical BID BM PRN Sharen Hones, [facility]   Given at 11/06/21 2694  ? LORazepam (ATIVAN) tablet 0.5 mg  0.5 mg Oral Daily PRN Charm Rings, NP   0.5 mg at 10/31/21 2139  ? magnesium hydroxide (MILK OF MAGNESIA) suspension 30 mL  30 mL Oral Daily PRN Charm Rings, NP   30 mL at 11/01/21 0750  ? metFORMIN (GLUCOPHAGE) tablet 500 mg  500 mg Oral BID WC Charm Rings, NP   500 mg at 11/02/21 8546  ? nicotine polacrilex (NICORETTE) gum 2 mg  2 mg Oral PRN Clapacs, Jackquline Denmark, MD   2 mg at 10/31/21 2008  ? OLANZapine (ZYPREXA) injection 10 mg  10 mg Intramuscular QHS PRN Clapacs, John T, MD      ? OLANZapine (ZYPREXA) tablet 10 mg  10 mg Oral QHS Clapacs, Jackquline Denmark, MD   10 mg at 11/05/21 2105  ?  pantoprazole (PROTONIX) EC tablet 40 mg  40 mg Oral Daily Clapacs, Jackquline Denmark, MD      ? ?PTA Medications: ?Medications Prior to Admission  ?Medication Sig Dispense Refill Last Dose  ? ARIPiprazole (ABILIFY) 5 MG tablet Take 5 mg by mouth daily. (Patient not taking: Reported on 10/30/2021)     ? diazepam (VALIUM) 5 MG tablet Take 5 mg by mouth every 6 (six) hours as needed for anxiety.     ? metFORMIN (GLUCOPHAGE) 500 MG tablet 500 mg 2 (two) times daily. (Patient not taking: Reported on 10/30/2021)     ? mirtazapine (REMERON) 15 MG tablet Take 15 mg by mouth at bedtime.  2   ? omeprazole (PRILOSEC) 20 MG capsule TAKE 1 CAPSULE BY MOUTH DAILY 30 capsule 11   ? ? ?Patient Stressors: Marital or family conflict   ? ?Patient Strengths: Supportive family/friends  ? ?Treatment Modalities: Medication Management, Group therapy, Case management,  ?1 to 1 session with clinician, Psychoeducation, Recreational therapy. ? ? ?Physician Treatment Plan for Primary Diagnosis: Bipolar I disorder, most recent episode (or current) manic (HCC) ?Long Term Goal(s): Improvement in symptoms so  as ready for discharge  ? ?Short Term Goals: Compliance with prescribed medications will improve ?Ability to demonstrate self-control will improve ? ?Medication Management: Evaluate patient's response, side effects, and tolerance of medication regimen. ? ?Therapeutic Interventions: 1 to 1 sessions, Unit Group sessions and Medication administration. ? ?Evaluation of Outcomes: Progressing ? ?Physician Treatment Plan for Secondary Diagnosis: Principal Problem: ?  Bipolar I disorder, most recent episode (or current) manic (HCC) ? ?Long Term Goal(s): Improvement in symptoms so as ready for discharge  ? ?Short Term Goals: Compliance with prescribed medications will improve ?Ability to demonstrate self-control will improve    ? ?Medication Management: Evaluate patient's response, side effects, and tolerance of medication regimen. ? ?Therapeutic Interventions: 1 to  1 sessions, Unit Group sessions and Medication administration. ? ?Evaluation of Outcomes: Progressing ? ? ?RN Treatment Plan for Primary Diagnosis: Bipolar I disorder, most recent episode (or current) manic (HCC) ?Long Term Goal(s): Knowledge of disease and therapeutic regimen to maintain health will improve ? ?Short Term Goals: Ability to remain free from injury will improve, Ability to verbalize frustration and anger appropriately will improve, Ability to demonstrate self-control, Ability to participate in decision making will improve, Ability to verbalize feelings will improve, Ability to disclose and discuss suicidal ideas, Ability to identify and develop effective coping behaviors will improve, and Compliance with prescribed medications will improve ? ?Medication Management: RN will administer medications as ordered by provider, will assess and evaluate patient's response and provide education to patient for prescribed medication. RN will report any adverse and/or side effects to prescribing provider. ? ?Therapeutic Interventions: 1 on 1 counseling sessions, Psychoeducation, Medication administration, Evaluate responses to treatment, Monitor vital signs and CBGs as ordered, Perform/monitor CIWA, COWS, AIMS and Fall Risk screenings as ordered, Perform wound care treatments as ordered. ? ?Evaluation of Outcomes: Progressing ? ? ?LCSW Treatment Plan for Primary Diagnosis: Bipolar I disorder, most recent episode (or current) manic (HCC) ?Long Term Goal(s): Safe transition to appropriate next level of care at discharge, Engage patient in therapeutic group addressing interpersonal concerns. ? ?Short Term Goals: Engage patient in aftercare planning with referrals and resources, Increase social support, Increase ability to appropriately verbalize feelings, Increase emotional regulation, Facilitate acceptance of mental health diagnosis and concerns, Facilitate patient progression through stages of change regarding  substance use diagnoses and concerns, Identify triggers associated with mental health/substance abuse issues, and Increase skills for wellness and recovery ? ?Therapeutic Interventions: Assess for all discharge needs, 1 to 1 time with Child psychotherapist, Explore available resources and support systems, Assess for adequacy in community support network, Educate family and significant other(s) on suicide prevention, Complete Psychosocial Assessment, Interpersonal group therapy. ? ?Evaluation of Outcomes: Progressing ? ? ?Progress in Treatment: ?Attending groups: Yes. ?Participating in groups: Yes. ?Taking medication as prescribed: Yes. ?Toleration medication: Yes. ?Family/Significant other contact made: Yes, individual(s) contacted:  SPE completed with Konrad Saha, father.  ?Patient understands diagnosis: Yes. ?Discussing patient identified problems/goals with staff: Yes. ?Medical problems stabilized or resolved: Yes. ?Denies suicidal/homicidal ideation: Yes. ?Issues/concerns per patient self-inventory: Yes. ?Other: none ? ?New problem(s) identified: No, Describe:  No additional problems/concerns expressed, patient's symptoms have improved, though remains hypomanic as evidence by over-sharing personal information with other patients and inappropriate comments. Patient to continue with treatment for further progress.   ? ?New Short Term/Long Term Goal(s): Patient to work towards elimination of  medication management for mood stabilization; development of comprehensive mental wellness plan. ? ?Patient Goals:  No additional goals identified at this time. Patient to continue  to work towards original goals identified in initial treatment team meeting. CSW will remain available to patient should they voice additional treatment goals.  ? ?Discharge Plan or Barriers: No psychosocial barriers identified at this time, patient to return to place of residence when appropriate for discharge.  ? ?Reason for Continuation of  Hospitalization: Mania ? ?Estimated Length of Stay: 1-3 days  ? ? ?Scribe for Treatment Team: ?Almedia BallsMichael W Lashaunta Sicard, LCSWA ?11/06/2021 ?8:58 AM ?

## 2021-11-06 NOTE — Plan of Care (Signed)
D: Pt alert and oriented. Pt endorses anxiety/depression however does not rate it. Pt goal: "Stay on my plan of action take care of myself and our children." Pt reports energy level as normal and concentration as being good. Pt reports sleep last night as being good. Pt did not receive medications for sleep. Pt reports experiencing 5/10 rectum pain at this time, prns given. Pt denies experiencing any SI/HI, or AVH at this time.  ? ?A: Scheduled medications administered to pt, per MD orders. Support and encouragement provided. Frequent verbal contact made. Routine safety checks conducted q15 minutes.  ? ?R: No adverse drug reactions noted. Pt verbally contracts for safety at this time. Pt compliant with some medications. Pt interacts well with others on the unit. Pt remains safe at this time. Will continue to monitor.  ? ?Problem: Education: ?Goal: Emotional status will improve ?Outcome: Progressing ?  ?Problem: Education: ?Goal: Mental status will improve ?Outcome: Not Progressing ?Goal: Verbalization of understanding the information provided will improve ?Outcome: Not Progressing ?  ?

## 2021-11-06 NOTE — Progress Notes (Signed)
[facility] MD Progress Note ? ?11/06/2021 3:22 PM ?Burnell Blanks Kain  ?MRN:  [id number] ?Subjective: Follow-up for this 41 year old woman having a manic episode.  Patient has calmed down quite a bit.  She still was up and a little agitated last night but not as disorganized and in the notes I see today does not appear to move had psychotic symptoms during group.  On evaluation she still is a little odd and overly upbeat in her affect but was lucid and not hyperverbal.  Tolerating medicine okay. ?Principal Problem: Bipolar I disorder, most recent episode (or current) manic ([place]) ?Diagnosis: Principal Problem: ?  Bipolar I disorder, most recent episode (or current) manic ([place]) ? ?Total Time spent with patient: 30 minutes ? ?Past Psychiatric History: Past history of recurrent episodes of both psychosis and depression ? ?Past Medical History:  ?Past Medical History:  ?Diagnosis Date  ? Anxiety   ? Cough 06/07/2020  ? Depression   ? Diabetes mellitus without complication ([place])   ? Gestational diabetes   ? Insomnia   ? Paranoid schizophrenia ([place])   ? Sinus drainage 11/01/2020  ?  ?Past Surgical History:  ?Procedure Laterality Date  ? APPENDECTOMY    ? BIOPSY N/A 01/24/2021  ? Procedure: BIOPSY;  Surgeon: Lin Landsman, MD;  Location: [place];  Service: Endoscopy;  Laterality: N/A;  ? COLONOSCOPY WITH PROPOFOL N/A 01/24/2021  ? Procedure: COLONOSCOPY WITH PROPOFOL;  Surgeon: Lin Landsman, MD;  Location: [employer];  Service: Endoscopy;  Laterality: N/A;  ? HERNIA REPAIR    ? POLYPECTOMY N/A 01/24/2021  ? Procedure: POLYPECTOMY;  Surgeon: Lin Landsman, MD;  Location: [place];  Service: Endoscopy;  Laterality: N/A;  ? TUBAL LIGATION Bilateral 07/03/2016  ? Procedure: POST PARTUM TUBAL LIGATION BY SALPINGECTOMY;  Surgeon: Gae Dry, MD;  Location: [facility];  Service: Gynecology;  Laterality: Bilateral;  ? ?Family History:  ?Family History  ?Problem Relation Age of Onset  ?  Diabetes Paternal Aunt   ? Diabetes Paternal Uncle   ? Diabetes Maternal Grandmother   ? Heart disease Maternal Grandmother   ? Hypertension Maternal Grandmother   ? Colon cancer Maternal Grandmother   ? Diabetes Maternal Grandfather   ? Ovarian cancer Cousin 35  ?     ? accurate family history  ? Uterine cancer Cousin   ? ?Family Psychiatric  History: See previous ?Social History:  ?Social History  ? ?Substance and Sexual Activity  ?Alcohol Use No  ?   ?Social History  ? ?Substance and Sexual Activity  ?Drug Use No  ?  ?Social History  ? ?Socioeconomic History  ? Marital status: Married  ?  Spouse name: Not on file  ? Number of children: Not on file  ? Years of education: Not on file  ? Highest education level: Not on file  ?Occupational History  ? Not on file  ?Tobacco Use  ? Smoking status: Every Day  ?  Packs/day: 0.50  ?  Types: Cigarettes  ? Smokeless tobacco: Never  ?Vaping Use  ? Vaping Use: Never used  ?Substance and Sexual Activity  ? Alcohol use: No  ? Drug use: No  ? Sexual activity: Yes  ?  Birth control/protection: Injection  ?Other Topics Concern  ? Not on file  ?Social History Narrative  ? Not on file  ? ?Social Determinants of Health  ? ?Financial Resource Strain: Not on file  ?Food Insecurity: Not on file  ?Transportation Needs: Not on file  ?  Physical Activity: Not on file  ?Stress: Not on file  ?Social Connections: Not on file  ? ?Additional Social History:  ?  ?  ?  ?  ?  ?  ?  ?  ?  ?  ?  ? ?Sleep: Fair ? ?Appetite:  Fair ? ?Current Medications: ?Current Facility-Administered Medications  ?Medication Dose Route Frequency Provider Last Rate Last Admin  ? acetaminophen (TYLENOL) tablet 650 mg  650 mg Oral Q6H PRN Patrecia Pour, NP   650 mg at 11/06/21 0654  ? alum & mag hydroxide-simeth (MAALOX/MYLANTA) 200-200-20 MG/5ML suspension 30 mL  30 mL Oral Q4H PRN Patrecia Pour, NP   30 mL at 11/05/21 1518  ? diazepam (VALIUM) tablet 5 mg  5 mg Oral BID Antonio Woodhams, Madie Reno, MD   5 mg at 11/06/21 [id number]  ?  diphenhydrAMINE (BENADRYL) capsule 50 mg  50 mg Oral Q6H PRN Madonna Flegal, Madie Reno, MD   50 mg at 11/02/21 2208  ? Or  ? diphenhydrAMINE (BENADRYL) injection 50 mg  50 mg Intramuscular Q6H PRN Mindy Behnken T, MD      ? haloperidol (HALDOL) tablet 5 mg  5 mg Oral Q6H PRN Aliesha Dolata T, MD      ? Or  ? haloperidol lactate (HALDOL) injection 5 mg  5 mg Intramuscular Q6H PRN Socorro Kanitz T, MD      ? hydrocortisone cream 1 %   Topical BID BM PRN Dorothe Pea, [facility]   Given at 11/06/21 [id number]  ? LORazepam (ATIVAN) tablet 0.5 mg  0.5 mg Oral Daily PRN Patrecia Pour, NP   0.5 mg at 10/31/21 2139  ? magnesium hydroxide (MILK OF MAGNESIA) suspension 30 mL  30 mL Oral Daily PRN Patrecia Pour, NP   30 mL at 11/01/21 0750  ? metFORMIN (GLUCOPHAGE) tablet 500 mg  500 mg Oral BID WC Patrecia Pour, NP   500 mg at 11/02/21 [id number]  ? nicotine polacrilex (NICORETTE) gum 2 mg  2 mg Oral PRN Ronny Korff, Madie Reno, MD   2 mg at 10/31/21 2008  ? OLANZapine (ZYPREXA) injection 10 mg  10 mg Intramuscular QHS PRN Takiah Maiden T, MD      ? OLANZapine (ZYPREXA) tablet 10 mg  10 mg Oral QHS Hermilo Dutter, Madie Reno, MD   10 mg at 11/05/21 2105  ? pantoprazole (PROTONIX) EC tablet 40 mg  40 mg Oral Daily Abbagail Scaff, Madie Reno, MD   40 mg at 11/06/21 0940  ? ? ?Lab Results: No results found for this or any previous visit (from the past 48 hour(s)). ? ?Blood Alcohol level:  ?Lab Results  ?Component Value Date  ? ETH <10 10/30/2021  ? ETH <5 09/06/2015  ? ? ?Metabolic Disorder Labs: ?Lab Results  ?Component Value Date  ? HGBA1C 5.5 09/07/2015  ? ?Lab Results  ?Component Value Date  ? PROLACTIN 14.9 09/07/2015  ? ?Lab Results  ?Component Value Date  ? CHOL 166 09/13/2019  ? TRIG 159 (H) 09/13/2019  ? HDL 47 09/13/2019  ? CHOLHDL 3.5 09/13/2019  ? VLDL 21 09/07/2015  ? [place] 91 09/13/2019  ? LDLCALC 102 (H) 09/07/2015  ? ? ?Physical Findings: ?AIMS:  , ,  ,  ,    ?CIWA:    ?COWS:    ? ?Musculoskeletal: ?Strength & Muscle Tone: within normal limits ?Gait & Station:  normal ?Patient leans: N/A ? ?Psychiatric Specialty Exam: ? ?Presentation  ?General Appearance: Appropriate for Environment; Disheveled; Neat ? ?Eye Contact:Fair ? ?  Speech:Pressured ? ?Speech Volume:Increased ? ?Handedness:Right ? ? ?Mood and Affect  ?Mood:Angry; Anxious; Irritable; Labile; Euphoric ? ?Affect:Labile ? ? ?Thought Process  ?Thought Processes:Disorganized ? ?Descriptions of Associations:Loose ? ?Orientation:Full (Time, Place and Person) ? ?Thought Content:Paranoid Ideation; Scattered; Delusions ? ?History of Schizophrenia/Schizoaffective disorder:No ? ?Duration of Psychotic Symptoms:Greater than six months ? ?Hallucinations:No data recorded ?Ideas of Reference:Paranoia ? ?Suicidal Thoughts:No data recorded ?Homicidal Thoughts:No data recorded ? ?Sensorium  ?Memory:Immediate Good; Recent Good ? ?Judgment:Impaired ? ?Insight:Lacking ? ? ?Executive Functions  ?Concentration:Poor ? ?Attention Span:Poor ? ?Recall:Fair ? ?[place] ? ?Language:Fair ? ? ?Psychomotor Activity  ?Psychomotor Activity:No data recorded ? ?Assets  ?Assets:Communication Skills; Desire for Improvement; Housing; Physical Health; Social Support ? ? ?Sleep  ?Sleep:No data recorded ? ? ?Physical Exam: ?Physical Exam ?Vitals and nursing note reviewed.  ?Constitutional:   ?   Appearance: Normal appearance.  ?HENT:  ?   Head: Normocephalic and atraumatic.  ?   Mouth/Throat:  ?   Pharynx: Oropharynx is clear.  ?Eyes:  ?   Pupils: Pupils are equal, round, and reactive to light.  ?Cardiovascular:  ?   Rate and Rhythm: Normal rate and regular rhythm.  ?Pulmonary:  ?   Effort: Pulmonary effort is normal.  ?   Breath sounds: Normal breath sounds.  ?Abdominal:  ?   General: Abdomen is flat.  ?   Palpations: Abdomen is soft.  ?Musculoskeletal:     ?   General: Normal range of motion.  ?Skin: ?   General: Skin is warm and dry.  ?Neurological:  ?   General: No focal deficit present.  ?   Mental Status: She is alert. Mental status is at  baseline.  ?Psychiatric:     ?   Attention and Perception: Attention normal.     ?   Mood and Affect: Mood is anxious.     ?   Speech: Speech normal.     ?   Behavior: Behavior is cooperative.     ?   Tho

## 2021-11-07 DIAGNOSIS — F3110 Bipolar disorder, current episode manic without psychotic features, unspecified: Secondary | ICD-10-CM | POA: Diagnosis not present

## 2021-11-07 MED ORDER — OLANZAPINE 10 MG PO TABS: 10.0000 mg | ORAL_TABLET | Freq: Every day | ORAL | 0 refills | Status: AC

## 2021-11-07 MED ORDER — DIAZEPAM 5 MG PO TABS
5.0000 mg | ORAL_TABLET | Freq: Two times a day (BID) | ORAL | 0 refills | Status: AC
Start: 2021-11-07 — End: ?

## 2021-11-07 MED ORDER — NICOTINE POLACRILEX 2 MG MT GUM: 2.0000 mg | CHEWING_GUM | OROMUCOSAL | 0 refills | Status: AC | PRN

## 2021-11-07 MED ORDER — METFORMIN HCL 500 MG PO TABS: 500.0000 mg | ORAL_TABLET | Freq: Two times a day (BID) | ORAL | 0 refills | Status: AC

## 2021-11-07 NOTE — Progress Notes (Signed)
Recreation Therapy Notes ? ?Date: 11/07/2021 ? ?Time: 9:15 am  ? ?Location: Craft room   ? ?Behavioral response: Appropriate  ? ?Intervention Topic: Animal Assisted Therapy  ? ?Discussion/Intervention:  ?Animal Assisted Therapy took place today during group.  Animal Assisted Therapy is the planned inclusion of an animal in a patient's treatment plan. The patients were able to engage in therapy with an animal during group. Participants were educated on what a service dog is and the different between a support dog and a service dog. Patient were informed on how animal needs are like a person needs. Individuals were enlightened on the process to get a service animal or support animal. Patients got the opportunity to pet the animal and were offered emotional support from the animal and staff.  ?Clinical Observations/Feedback:  ?Patient came to group and was on topic and was focused on what peers and staff had to say. Participant shared their experiences and history with animals. Individual was social with peers, staff and animal while participating in group.  ?Isay Perleberg LRT/CTRS  ? ? ? ? ? ? ? ?Adama Ivins ?11/07/2021 12:19 PM ?

## 2021-11-07 NOTE — Progress Notes (Signed)
?  [facility] Adult Case Management Discharge Plan : ? ?Will you be returning to the same living situation after discharge:  Yes,  pt reports that she is returning home.  ?At discharge, do you have transportation home?: Yes,  pt reports that her mother will provide transportation ?Do you have the ability to pay for your medications: Yes,  [facility] Medicare ? ?Release of information consent forms completed and in the chart;  Patient's signature needed at discharge. ? ?Patient to Follow up at: ? Follow-up Information   ? ? [facility].   ?Why: Appointment is scheduled 11/13/2021 at 10:20AM.  Appointment is face to face.  Bring new prescriptions to the appointment ?Contact information: ?[address] ?[place] [address] ?[phone] ? ? ?  ?  ? ?  ?  ? ?  ? ? ?Next level of care provider has access to [facility] Link:no ? ?Safety Planning and Suicide Prevention discussed: Yes,  SPE completed with the patient's father. ? ?  ? ?Has patient been referred to the Quitline?: Patient refused referral  Patient declined referral for Quitline ? ?Patient has been referred for addiction treatment: Pt. refused referral ? ?Harden Mo, LCSW ?11/07/2021, 12:09 PM ?

## 2021-11-07 NOTE — Discharge Summary (Signed)
Physician Discharge Summary Note ? ?Patient:  Frances Sanders is an 41 y.o., female ?MRN:  [id number] ?DOB:  August 30, 1980 ?Patient phone:  [phone] (home)  ?Patient address:   ?[address] ?[place] [address],  ?Total Time spent with patient: 30 minutes ? ?Date of Admission:  10/31/2021 ?Date of Discharge: 11/07/2021 ? ?Reason for Admission: Patient was admitted after displaying symptoms consistent with manic psychotic behavior over a day or so prior to admission.  Appeared to be psychotic and agitated in the emergency room ? ?Principal Problem: Bipolar I disorder, most recent episode (or current) manic (HCC) ?Discharge Diagnoses: Principal Problem: ?  Bipolar I disorder, most recent episode (or current) manic (HCC) ? ? ?Past Psychiatric History: Past history of chronic mood problems.  Usually fairly stable with anxiety but has had isolated episodes of depression and psychosis in the past. ? ?Past Medical History:  ?Past Medical History:  ?Diagnosis Date  ? Anxiety   ? Cough 06/07/2020  ? Depression   ? Diabetes mellitus without complication (HCC)   ? Gestational diabetes   ? Insomnia   ? Paranoid schizophrenia (HCC)   ? Sinus drainage 11/01/2020  ?  ?Past Surgical History:  ?Procedure Laterality Date  ? APPENDECTOMY    ? BIOPSY N/A 01/24/2021  ? Procedure: BIOPSY;  Surgeon: Toney Reil, MD;  Location: [facility];  Service: Endoscopy;  Laterality: N/A;  ? COLONOSCOPY WITH PROPOFOL N/A 01/24/2021  ? Procedure: COLONOSCOPY WITH PROPOFOL;  Surgeon: Toney Reil, MD;  Location: [facility];  Service: Endoscopy;  Laterality: N/A;  ? HERNIA REPAIR    ? POLYPECTOMY N/A 01/24/2021  ? Procedure: POLYPECTOMY;  Surgeon: Toney Reil, MD;  Location: [facility];  Service: Endoscopy;  Laterality: N/A;  ? TUBAL LIGATION Bilateral 07/03/2016  ? Procedure: POST PARTUM TUBAL LIGATION BY SALPINGECTOMY;  Surgeon: Nadara Mustard, MD;  Location: [facility] OR[facility];  Service: Gynecology;   Laterality: Bilateral;  ? ?Family History:  ?Family History  ?Problem Relation Age of Onset  ? Diabetes Paternal Aunt   ? Diabetes Paternal Uncle   ? Diabetes Maternal Grandmother   ? Heart disease Maternal Grandmother   ? Hypertension Maternal Grandmother   ? Colon cancer Maternal Grandmother   ? Diabetes Maternal Grandfather   ? Ovarian cancer Cousin 25  ?     ? accurate family history  ? Uterine cancer Cousin   ? ?Family Psychiatric  History: See previous ?Social History:  ?Social History  ? ?Substance and Sexual Activity  ?Alcohol Use No  ?   ?Social History  ? ?Substance and Sexual Activity  ?Drug Use No  ?  ?Social History  ? ?Socioeconomic History  ? Marital status: Married  ?  Spouse name: Not on file  ? Number of children: Not on file  ? Years of education: Not on file  ? Highest education level: Not on file  ?Occupational History  ? Not on file  ?Tobacco Use  ? Smoking status: Every Day  ?  Packs/day: 0.50  ?  Types: Cigarettes  ? Smokeless tobacco: Never  ?Vaping Use  ? Vaping Use: Never used  ?Substance and Sexual Activity  ? Alcohol use: No  ? Drug use: No  ? Sexual activity: Yes  ?  Birth control/protection: Injection  ?Other Topics Concern  ? Not on file  ?Social History Narrative  ? Not on file  ? ?Social Determinants of Health  ? ?Financial Resource Strain: Not on file  ?Food Insecurity:  Not on file  ?Transportation Needs: Not on file  ?Physical Activity: Not on file  ?Stress: Not on file  ?Social Connections: Not on file  ? ? ?Hospital Course: Admitted to psychiatric ward.  15-minute checks maintained.  No violence or aggression towards others or attempts at self-harm but patient was psychotic agitated hyperverbal grandiose on admission.  Initially resistant to changing medicines required a forced medication order for olanzapine.  Patient declined my suggestion of starting lithium but eventually did agree to oral olanzapine.  She was taken off her antidepressant but continued on her usual diazepam.   Symptoms improved and over the past 2 days she has calm down to the point that she is lucid and appropriate with better insight.  Sleeping better at night.  Reviewed medication plan with patient.  Left telephone message for her outpatient psychiatrist.  Patient will be discharged to follow up with her usual outpatient psychiatrist and is encouraged to avoid all intoxicating drugs, stay compliant with current medicine, make sure she is getting a decent amount of sleep and minimize stress. ? ?Physical Findings: ?AIMS:  , ,  ,  ,    ?CIWA:    ?COWS:    ? ?Musculoskeletal: ?Strength & Muscle Tone: within normal limits ?Gait & Station: normal ?Patient leans: N/A ? ? ?Psychiatric Specialty Exam: ? ?Presentation  ?General Appearance: Appropriate for Environment; Disheveled; Neat ? ?Eye Contact:Fair ? ?Speech:Pressured ? ?Speech Volume:Increased ? ?Handedness:Right ? ? ?Mood and Affect  ?Mood:Angry; Anxious; Irritable; Labile; Euphoric ? ?Affect:Labile ? ? ?Thought Process  ?Thought Processes:Disorganized ? ?Descriptions of Associations:Loose ? ?Orientation:Full (Time, Place and Person) ? ?Thought Content:Paranoid Ideation; Scattered; Delusions ? ?History of Schizophrenia/Schizoaffective disorder:No ? ?Duration of Psychotic Symptoms:Greater than six months ? ?Hallucinations:No data recorded ?Ideas of Reference:Paranoia ? ?Suicidal Thoughts:No data recorded ?Homicidal Thoughts:No data recorded ? ?Sensorium  ?Memory:Immediate Good; Recent Good ? ?Judgment:Impaired ? ?Insight:Lacking ? ? ?Executive Functions  ?Concentration:Poor ? ?Attention Span:Poor ? ?Recall:Fair ? ?Fund of Knowledge:Fair ? ?Language:Fair ? ? ?Psychomotor Activity  ?Psychomotor Activity:No data recorded ? ?Assets  ?Assets:Communication Skills; Desire for Improvement; Housing; Physical Health; Social Support ? ? ?Sleep  ?Sleep:No data recorded ? ? ?Physical Exam: ?Physical Exam ?Vitals and nursing note reviewed.  ?Constitutional:   ?   Appearance: Normal  appearance.  ?HENT:  ?   Head: Normocephalic and atraumatic.  ?   Mouth/Throat:  ?   Pharynx: Oropharynx is clear.  ?Eyes:  ?   Pupils: Pupils are equal, round, and reactive to light.  ?Cardiovascular:  ?   Rate and Rhythm: Normal rate and regular rhythm.  ?Pulmonary:  ?   Effort: Pulmonary effort is normal.  ?   Breath sounds: Normal breath sounds.  ?Abdominal:  ?   General: Abdomen is flat.  ?   Palpations: Abdomen is soft.  ?Musculoskeletal:     ?   General: Normal range of motion.  ?Skin: ?   General: Skin is warm and dry.  ?Neurological:  ?   General: No focal deficit present.  ?   Mental Status: She is alert. Mental status is at baseline.  ?Psychiatric:     ?   Attention and Perception: Attention normal.     ?   Mood and Affect: Mood normal.     ?   Speech: Speech normal.     ?   Behavior: Behavior is cooperative.     ?   Thought Content: Thought content normal.     ?   Cognition and Memory:  Cognition normal.     ?   Judgment: Judgment normal.  ? ?Review of Systems  ?Constitutional: Negative.   ?HENT: Negative.    ?Eyes: Negative.   ?Respiratory: Negative.    ?Cardiovascular: Negative.   ?Gastrointestinal: Negative.   ?Musculoskeletal: Negative.   ?Skin: Negative.   ?Neurological: Negative.   ?Psychiatric/Behavioral: Negative.    ?Blood pressure 124/64, pulse (!) 105, temperature 98.3 ?F (36.8 ?C), temperature source Oral, resp. rate 17, height 5' (1.524 m), weight 49 kg, SpO2 99 %, unknown if currently breastfeeding. Body mass index is 21.09 kg/m?. ? ? ?Social History  ? ?Tobacco Use  ?Smoking Status Every Day  ? Packs/day: 0.50  ? Types: Cigarettes  ?Smokeless Tobacco Never  ? ?Tobacco Cessation:  A prescription for an FDA-approved tobacco cessation medication provided at discharge ? ? ?Blood Alcohol level:  ?Lab Results  ?Component Value Date  ? ETH <10 10/30/2021  ? ETH <5 09/06/2015  ? ? ?Metabolic Disorder Labs:  ?Lab Results  ?Component Value Date  ? HGBA1C 5.5 09/07/2015  ? ?Lab Results  ?Component  Value Date  ? PROLACTIN 14.9 09/07/2015  ? ?Lab Results  ?Component Value Date  ? CHOL 166 09/13/2019  ? TRIG 159 (H) 09/13/2019  ? HDL 47 09/13/2019  ? CHOLHDL 3.5 09/13/2019  ? VLDL 21 09/07/2015  ? LDL

## 2021-11-07 NOTE — Care Management Important Message (Signed)
Important Message ? ?Patient Details  ?Name: Frances Sanders ?MRN: [id number] ?Date of Birth: 1981-07-24 ? ? ?Medicare Important Message Given:  Yes ? ?Pt denied plans to appeal discharge at this time.  ? ? ?Harden Mo, LCSW ?11/07/2021, 12:17 PM ?

## 2021-11-07 NOTE — Progress Notes (Signed)
Discharge Note: ? ?Patient denies SI/HI/AVH at this time. Discharge instructions, AVS, and transition record gone over with patient. Patient agrees to comply with medication management, follow-up visit, and outpatient therapy. Patient belongings returned to patient. Patient questions and concerns addressed and answered. Patient ambulatory off unit. Patient discharged to home with her father. ? ?

## 2021-11-07 NOTE — Progress Notes (Signed)
Recreation Therapy Notes ? ?INPATIENT RECREATION TR PLAN ? ?Patient Details ?Name: Frances Sanders ?MRN: [id number] ?DOB: [birth year] ?Today's Date: 11/07/2021 ? ?Rec Therapy Plan ?Is patient appropriate for Therapeutic Recreation?: Yes ?Treatment times per week: at least 3 ?Estimated Length of Stay: 5-7 days ?TR Treatment/Interventions: Group participation (Comment) ? ?Discharge Criteria ?Pt will be discharged from therapy if:: Discharged ?Treatment plan/goals/alternatives discussed and agreed upon by:: Patient/family ? ?Discharge Summary ?Short term goals set: Patient will engage in groups without prompting or encouragement from LRT x3 group sessions within 5 recreation therapy group sessions ?Short term goals met: Complete ?Progress toward goals comments: Groups attended ?Which groups?: Leisure education, AAA/T, Other (Comment) (Time Management) ?Reason goals not met: N/A ?Therapeutic equipment acquired: N/A ?Reason patient discharged from therapy: Discharge from hospital ?Pt/family agrees with progress & goals achieved: Yes ?Date patient discharged from therapy: 11/07/21 ? ? ?Conlee Sliter ?11/07/2021, 2:28 PM ?

## 2021-11-07 NOTE — Progress Notes (Addendum)
Patient refused to take her scheduled Metformin, however, she has been refusing this medication majority of her stay. Patient also stated that she was going to wait to take her scheduled Valium, until after she speaks with the doctor. MD will be notified. ?

## 2021-11-07 NOTE — [facility] Suicide Risk Assessment (Signed)
[facility] Discharge Suicide Risk Assessment ? ? ?Principal Problem: Bipolar I disorder, most recent episode (or current) manic (HCC) ?Discharge Diagnoses: Principal Problem: ?  Bipolar I disorder, most recent episode (or current) manic (HCC) ? ? ?Total Time spent with patient: 30 minutes ? ?Musculoskeletal: ?Strength & Muscle Tone: within normal limits ?Gait & Station: normal ?Patient leans: N/A ? ?Psychiatric Specialty Exam ? ?Presentation  ?General Appearance: Appropriate for Environment; Disheveled; Neat ? ?Eye Contact:Fair ? ?Speech:Pressured ? ?Speech Volume:Increased ? ?Handedness:Right ? ? ?Mood and Affect  ?Mood:Angry; Anxious; Irritable; Labile; Euphoric ? ?Duration of Depression Symptoms: No data recorded ?Affect:Labile ? ? ?Thought Process  ?Thought Processes:Disorganized ? ?Descriptions of Associations:Loose ? ?Orientation:Full (Time, Place and Person) ? ?Thought Content:Paranoid Ideation; Scattered; Delusions ? ?History of Schizophrenia/Schizoaffective disorder:No ? ?Duration of Psychotic Symptoms:Greater than six months ? ?Hallucinations:No data recorded ?Ideas of Reference:Paranoia ? ?Suicidal Thoughts:No data recorded ?Homicidal Thoughts:No data recorded ? ?Sensorium  ?Memory:Immediate Good; Recent Good ? ?Judgment:Impaired ? ?Insight:Lacking ? ? ?Executive Functions  ?Concentration:Poor ? ?Attention Span:Poor ? ?Recall:Fair ? ?Fund of Knowledge:Fair ? ?Language:Fair ? ? ?Psychomotor Activity  ?Psychomotor Activity:No data recorded ? ?Assets  ?Assets:Communication Skills; Desire for Improvement; Housing; Physical Health; Social Support ? ? ?Sleep  ?Sleep:No data recorded ? ?Physical Exam: ?Physical Exam ?Vitals and nursing note reviewed.  ?Constitutional:   ?   Appearance: Normal appearance.  ?HENT:  ?   Head: Normocephalic and atraumatic.  ?   Mouth/Throat:  ?   Pharynx: Oropharynx is clear.  ?Eyes:  ?   Pupils: Pupils are equal, round, and reactive to light.  ?Cardiovascular:  ?   Rate and Rhythm: Normal  rate and regular rhythm.  ?Pulmonary:  ?   Effort: Pulmonary effort is normal.  ?   Breath sounds: Normal breath sounds.  ?Abdominal:  ?   General: Abdomen is flat.  ?   Palpations: Abdomen is soft.  ?Musculoskeletal:     ?   General: Normal range of motion.  ?Skin: ?   General: Skin is warm and dry.  ?Neurological:  ?   General: No focal deficit present.  ?   Mental Status: She is alert. Mental status is at baseline.  ?Psychiatric:     ?   Attention and Perception: Attention normal.     ?   Mood and Affect: Mood normal.     ?   Speech: Speech normal.     ?   Behavior: Behavior is cooperative.     ?   Thought Content: Thought content normal.     ?   Cognition and Memory: Cognition normal.     ?   Judgment: Judgment normal.  ? ?Review of Systems  ?Constitutional: Negative.   ?HENT: Negative.    ?Eyes: Negative.   ?Respiratory: Negative.    ?Cardiovascular: Negative.   ?Gastrointestinal: Negative.   ?Musculoskeletal: Negative.   ?Skin: Negative.   ?Neurological: Negative.   ?Psychiatric/Behavioral: Negative.    ?Blood pressure 124/64, pulse (!) 105, temperature 98.3 ?F (36.8 ?C), temperature source Oral, resp. rate 17, height 5' (1.524 m), weight 49 kg, SpO2 99 %, unknown if currently breastfeeding. Body mass index is 21.09 kg/m?. ? ?Mental Status Per Nursing Assessment::   ?On Admission:  NA ? ?Demographic Factors:  ?Caucasian and Living alone ? ?Loss Factors: ?Loss of significant relationship ? ?Historical Factors: ?Impulsivity ? ?Risk Reduction Factors:   ?Sense of responsibility to family, Positive social support, and Positive therapeutic relationship ? ?Continued Clinical Symptoms:  ?Bipolar Disorder:   Mixed  State ? ?Cognitive Features That Contribute To Risk:  ?None   ? ?Suicide Risk:  ?Minimal: No identifiable suicidal ideation.  Patients presenting with no risk factors but with morbid ruminations; may be classified as minimal risk based on the severity of the depressive symptoms ? ? ? ?Plan Of Care/Follow-up  recommendations: Patient is denying any suicidal ideation.  Affect is now euthymic.  Not showing signs of mania.  Not aggressive or agitated.  Able to express rational plans for the future.  Agrees to continue current medicine and follow up with her usual outpatient provider. ? ? ?Mordecai Rasmussen, MD ?11/07/2021, 10:59 AM ?

## 2021-11-07 NOTE — Progress Notes (Signed)
D- Patient alert and oriented. Patient presents in a pleasant mood on assessment reporting that she slept good last night and had complaints of rectum pain, rating it a "7/10", in which she did request PRN Tylenol as well as Hydrocortisone cream. Patient states to this writer that she believes "it's hemorrhoids". Patient endorsed both depression/anxiety, rating them an "8/10". Patient stated that "just what I have to face when I leave, but I talked to Daddy last night and I think everything's going to be fine. I have my pastor involved, it's just a lot of following up to do". Patient denies SI, HI, AVH at this time. Patient's goal for today is "to get home to my children. They both depend on me. To go celebrate [facility] tonight at my church".  ? ?A- Some scheduled medications administered to patient, per MD orders. Support and encouragement provided. Routine safety checks conducted every 15 minutes. Patient informed to notify staff with problems or concerns. ? ?R- No adverse drug reactions noted. Patient contracts for safety at this time. Patient compliant with medications and treatment plan. Patient receptive, calm, and cooperative. Patient interacts well with others on the unit. Patient remains safe at this time. ? ?

## 2021-11-15 ENCOUNTER — Encounter: Payer: Self-pay | Admitting: Obstetrics & Gynecology

## 2021-11-25 DIAGNOSIS — M9902 Segmental and somatic dysfunction of thoracic region: Secondary | ICD-10-CM | POA: Diagnosis not present

## 2021-11-25 DIAGNOSIS — M546 Pain in thoracic spine: Secondary | ICD-10-CM | POA: Diagnosis not present

## 2021-11-25 DIAGNOSIS — M5412 Radiculopathy, cervical region: Secondary | ICD-10-CM | POA: Diagnosis not present

## 2021-11-25 DIAGNOSIS — M9901 Segmental and somatic dysfunction of cervical region: Secondary | ICD-10-CM | POA: Diagnosis not present

## 2021-12-16 ENCOUNTER — Ambulatory Visit: Payer: Medicare Other | Admitting: Internal Medicine

## 2022-01-13 ENCOUNTER — Ambulatory Visit (INDEPENDENT_AMBULATORY_CARE_PROVIDER_SITE_OTHER): Payer: Medicare Other | Admitting: Internal Medicine

## 2022-01-13 ENCOUNTER — Encounter: Payer: Self-pay | Admitting: Internal Medicine

## 2022-01-13 VITALS — BP 108/76 | HR 104 | Ht 60.0 in | Wt 103.0 lb

## 2022-01-13 DIAGNOSIS — R1031 Right lower quadrant pain: Secondary | ICD-10-CM

## 2022-01-13 DIAGNOSIS — F3110 Bipolar disorder, current episode manic without psychotic features, unspecified: Secondary | ICD-10-CM | POA: Diagnosis not present

## 2022-01-13 DIAGNOSIS — E119 Type 2 diabetes mellitus without complications: Secondary | ICD-10-CM | POA: Diagnosis not present

## 2022-01-13 DIAGNOSIS — R140 Abdominal distension (gaseous): Secondary | ICD-10-CM

## 2022-01-13 DIAGNOSIS — F17200 Nicotine dependence, unspecified, uncomplicated: Secondary | ICD-10-CM | POA: Diagnosis not present

## 2022-01-13 LAB — GLUCOSE, POCT (MANUAL RESULT ENTRY): POC Glucose: 111 mg/dl — AB (ref 70–99)

## 2022-01-13 NOTE — Assessment & Plan Note (Signed)
Refer to OB/GYN 

## 2022-01-13 NOTE — Assessment & Plan Note (Signed)
Suggest SIBO diet

## 2022-01-13 NOTE — Progress Notes (Signed)
Established Patient Office Visit  Subjective:  Patient ID: Frances Sanders, female    DOB: 1981-07-14  Age: 41 y.o. MRN: [id number]  CC:  Chief Complaint  Patient presents with   Follow-up    HPI  Frances Sanders presents for general check   Past Medical History:  Diagnosis Date   Anxiety    Cough 06/07/2020   Depression    Diabetes mellitus without complication ([place])    Gestational diabetes    Insomnia    Paranoid schizophrenia ([place])    Sinus drainage 11/01/2020    Past Surgical History:  Procedure Laterality Date   APPENDECTOMY     BIOPSY N/A 01/24/2021   Procedure: BIOPSY;  Surgeon: Lin Landsman, MD;  Location: [place];  Service: Endoscopy;  Laterality: N/A;   COLONOSCOPY WITH PROPOFOL N/A 01/24/2021   Procedure: COLONOSCOPY WITH PROPOFOL;  Surgeon: Lin Landsman, MD;  Location: [place];  Service: Endoscopy;  Laterality: N/A;   HERNIA REPAIR     POLYPECTOMY N/A 01/24/2021   Procedure: POLYPECTOMY;  Surgeon: Lin Landsman, MD;  Location: [place];  Service: Endoscopy;  Laterality: N/A;   TUBAL LIGATION Bilateral 07/03/2016   Procedure: POST PARTUM TUBAL LIGATION BY SALPINGECTOMY;  Surgeon: Gae Dry, MD;  Location: [facility];  Service: Gynecology;  Laterality: Bilateral;    Family History  Problem Relation Age of Onset   Diabetes Paternal Aunt    Diabetes Paternal Uncle    Diabetes Maternal Grandmother    Heart disease Maternal Grandmother    Hypertension Maternal Grandmother    Colon cancer Maternal Grandmother    Diabetes Maternal Grandfather    Ovarian cancer Cousin 25       ? accurate family history   Uterine cancer Cousin     Social History   Socioeconomic History   Marital status: Married    Spouse name: Not on file   Number of children: Not on file   Years of education: Not on file   Highest education level: Not on file  Occupational History   Not on file  Tobacco Use    Smoking status: Every Day    Packs/day: 0.50    Types: Cigarettes   Smokeless tobacco: Never  Vaping Use   Vaping Use: Never used  Substance and Sexual Activity   Alcohol use: No   Drug use: No   Sexual activity: Yes    Birth control/protection: Injection  Other Topics Concern   Not on file  Social History Narrative   Not on file   Social Determinants of Health   Financial Resource Strain: Not on file  Food Insecurity: Not on file  Transportation Needs: Not on file  Physical Activity: Not on file  Stress: Not on file  Social Connections: Not on file  Intimate Partner Violence: Not on file     Current Outpatient Medications:    diazepam (VALIUM) 5 MG tablet, Take 1 tablet (5 mg total) by mouth 2 (two) times daily., Disp: 60 tablet, Rfl: 0   metFORMIN (GLUCOPHAGE) 500 MG tablet, Take 1 tablet (500 mg total) by mouth 2 (two) times daily with a meal., Disp: 60 tablet, Rfl: 0   nicotine polacrilex (NICORETTE) 2 MG gum, Take 1 each (2 mg total) by mouth as needed for smoking cessation., Disp: 100 tablet, Rfl: 0   OLANZapine (ZYPREXA) 10 MG tablet, Take 1 tablet (10 mg total) by mouth at bedtime., Disp: 30 tablet, Rfl: 0   omeprazole (  PRILOSEC) 20 MG capsule, TAKE 1 CAPSULE BY MOUTH DAILY, Disp: 30 capsule, Rfl: 11   No Known Allergies  ROS Review of Systems  Constitutional: Negative.   HENT: Negative.    Eyes: Negative.   Respiratory: Negative.    Cardiovascular: Negative.   Gastrointestinal: Negative.   Endocrine: Negative.   Genitourinary: Negative.   Musculoskeletal: Negative.   Skin: Negative.   Allergic/Immunologic: Negative.   Neurological: Negative.   Hematological: Negative.   Psychiatric/Behavioral: Negative.    All other systems reviewed and are negative.     Objective:    Physical Exam Vitals reviewed.  Constitutional:      Appearance: Normal appearance.  HENT:     Mouth/Throat:     Mouth: Mucous membranes are moist.  Eyes:     Pupils: Pupils  are equal, round, and reactive to light.  Neck:     Vascular: No carotid bruit.  Cardiovascular:     Rate and Rhythm: Normal rate and regular rhythm.     Pulses: Normal pulses.     Heart sounds: Normal heart sounds.  Pulmonary:     Effort: Pulmonary effort is normal.     Breath sounds: Normal breath sounds.  Abdominal:     General: Bowel sounds are normal. There is distension.     Palpations: Abdomen is soft. There is no hepatomegaly, splenomegaly or mass.     Tenderness: There is no abdominal tenderness.     Hernia: No hernia is present.  Musculoskeletal:        General: No tenderness.     Cervical back: Neck supple.     Right lower leg: No edema.     Left lower leg: No edema.  Skin:    Findings: No rash.  Neurological:     Mental Status: She is alert and oriented to person, place, and time.     Motor: No weakness.  Psychiatric:        Mood and Affect: Mood and affect normal.        Behavior: Behavior normal.     BP 108/76   Pulse (!) 104   Ht 5' (1.524 m)   Wt 103 lb (46.7 kg)   BMI 20.12 kg/m  Wt Readings from Last 3 Encounters:  01/13/22 103 lb (46.7 kg)  10/30/21 115 lb (52.2 kg)  09/16/21 114 lb (51.7 kg)     Health Maintenance Due  Topic Date Due   COVID-19 Vaccine (1) Never done   HIV Screening  Never done   Hepatitis C Screening  Never done   TETANUS/TDAP  Never done    There are no preventive care reminders to display for this patient.  Lab Results  Component Value Date   TSH 1.874 10/30/2021   Lab Results  Component Value Date   WBC 10.9 (H) 10/30/2021   HGB 15.2 (H) 10/30/2021   HCT 45.2 10/30/2021   MCV 99.8 10/30/2021   PLT 331 10/30/2021   Lab Results  Component Value Date   NA 132 (L) 10/30/2021   K 3.5 10/30/2021   CO2 20 (L) 10/30/2021   GLUCOSE 172 (H) 10/30/2021   BUN 5 (L) 10/30/2021   CREATININE 0.78 10/30/2021   BILITOT 0.6 10/30/2021   ALKPHOS 67 10/30/2021   AST 26 10/30/2021   ALT 11 10/30/2021   PROT 8.2 (H)  10/30/2021   ALBUMIN 4.9 10/30/2021   CALCIUM 9.3 10/30/2021   ANIONGAP 13 10/30/2021   EGFR 112 09/16/2021   Lab Results  Component  Value Date   CHOL 166 09/13/2019   Lab Results  Component Value Date   HDL 47 09/13/2019   Lab Results  Component Value Date   LDLCALC 91 09/13/2019   Lab Results  Component Value Date   TRIG 159 (H) 09/13/2019   Lab Results  Component Value Date   CHOLHDL 3.5 09/13/2019   Lab Results  Component Value Date   HGBA1C 5.5 09/07/2015      Assessment & Plan:  Patient is referred to OB/GYN for lower abdominal pain Problem List Items Addressed This Visit       Other   Tobacco use disorder    Patient stated that she has stopped smoking      Abdominal distension    Suggest SIBO diet      RLQ abdominal pain    Refer to OB/GYN      Bipolar I disorder, most recent episode (or current) manic ([place])    Patient is stable now she is under care of her psychiatrist      Other Visit Diagnoses     Type 2 diabetes mellitus without complication, without long-term current use of insulin ([place])    -  Primary   Relevant Orders   POCT glucose (manual entry) (Completed)       No orders of the defined types were placed in this encounter.   Follow-up: No follow-ups on file.    Cletis Athens, MD

## 2022-01-13 NOTE — Assessment & Plan Note (Signed)
Patient is stable now she is under care of her psychiatrist

## 2022-01-13 NOTE — Assessment & Plan Note (Signed)
Patient stated that she has stopped smoking

## 2022-01-15 DIAGNOSIS — Z79899 Other long term (current) drug therapy: Secondary | ICD-10-CM | POA: Diagnosis not present

## 2022-01-20 DIAGNOSIS — Z6820 Body mass index (BMI) 20.0-20.9, adult: Secondary | ICD-10-CM | POA: Diagnosis not present

## 2022-03-31 DIAGNOSIS — R634 Abnormal weight loss: Secondary | ICD-10-CM | POA: Diagnosis not present

## 2022-03-31 DIAGNOSIS — M5412 Radiculopathy, cervical region: Secondary | ICD-10-CM | POA: Diagnosis not present

## 2022-03-31 DIAGNOSIS — Z136 Encounter for screening for cardiovascular disorders: Secondary | ICD-10-CM | POA: Diagnosis not present

## 2022-03-31 DIAGNOSIS — Z1321 Encounter for screening for nutritional disorder: Secondary | ICD-10-CM | POA: Diagnosis not present

## 2022-03-31 DIAGNOSIS — Z13220 Encounter for screening for lipoid disorders: Secondary | ICD-10-CM | POA: Diagnosis not present

## 2022-03-31 DIAGNOSIS — Z131 Encounter for screening for diabetes mellitus: Secondary | ICD-10-CM | POA: Diagnosis not present

## 2022-03-31 DIAGNOSIS — E559 Vitamin D deficiency, unspecified: Secondary | ICD-10-CM | POA: Diagnosis not present

## 2022-03-31 DIAGNOSIS — M9901 Segmental and somatic dysfunction of cervical region: Secondary | ICD-10-CM | POA: Diagnosis not present

## 2022-03-31 DIAGNOSIS — Z1329 Encounter for screening for other suspected endocrine disorder: Secondary | ICD-10-CM | POA: Diagnosis not present

## 2022-03-31 DIAGNOSIS — M9902 Segmental and somatic dysfunction of thoracic region: Secondary | ICD-10-CM | POA: Diagnosis not present

## 2022-03-31 DIAGNOSIS — M546 Pain in thoracic spine: Secondary | ICD-10-CM | POA: Diagnosis not present

## 2022-04-02 DIAGNOSIS — M5412 Radiculopathy, cervical region: Secondary | ICD-10-CM | POA: Diagnosis not present

## 2022-04-02 DIAGNOSIS — M546 Pain in thoracic spine: Secondary | ICD-10-CM | POA: Diagnosis not present

## 2022-04-02 DIAGNOSIS — M9902 Segmental and somatic dysfunction of thoracic region: Secondary | ICD-10-CM | POA: Diagnosis not present

## 2022-04-02 DIAGNOSIS — M9901 Segmental and somatic dysfunction of cervical region: Secondary | ICD-10-CM | POA: Diagnosis not present

## 2022-07-07 ENCOUNTER — Telehealth: Payer: Self-pay | Admitting: Internal Medicine

## 2022-07-07 NOTE — Telephone Encounter (Signed)
Left message for patient to call back and schedule Medicare Annual Wellness Visit (AWV).   If patient returns my call, please transfer call to my jabber #[phone].  Left my Zachery Conch [phone].  Last AWV:01/03/2019   Please schedule at anytime with Nurse Health Advisor.

## 2023-01-05 DIAGNOSIS — J020 Streptococcal pharyngitis: Secondary | ICD-10-CM | POA: Diagnosis not present

## 2023-01-05 DIAGNOSIS — B3731 Acute candidiasis of vulva and vagina: Secondary | ICD-10-CM | POA: Diagnosis not present

## 2023-01-05 DIAGNOSIS — Z03818 Encounter for observation for suspected exposure to other biological agents ruled out: Secondary | ICD-10-CM | POA: Diagnosis not present

## 2023-02-25 DIAGNOSIS — R6884 Jaw pain: Secondary | ICD-10-CM | POA: Diagnosis not present

## 2023-02-25 DIAGNOSIS — R591 Generalized enlarged lymph nodes: Secondary | ICD-10-CM | POA: Diagnosis not present

## 2023-03-25 DIAGNOSIS — R220 Localized swelling, mass and lump, head: Secondary | ICD-10-CM | POA: Diagnosis not present

## 2023-06-17 DIAGNOSIS — N390 Urinary tract infection, site not specified: Secondary | ICD-10-CM | POA: Diagnosis not present

## 2023-06-17 DIAGNOSIS — R350 Frequency of micturition: Secondary | ICD-10-CM | POA: Diagnosis not present

## 2023-08-27 ENCOUNTER — Other Ambulatory Visit: Payer: Self-pay | Admitting: Obstetrics & Gynecology

## 2023-08-27 DIAGNOSIS — Z1231 Encounter for screening mammogram for malignant neoplasm of breast: Secondary | ICD-10-CM
